# Patient Record
Sex: Male | Born: 1965 | Race: White | Hispanic: No | Marital: Married | Smoking: Current every day smoker
Health system: Southern US, Community
[De-identification: ages and names within clinical notes are randomized; demographics above are authoritative.]

## PROBLEM LIST (undated history)

## (undated) DIAGNOSIS — S0990XA Unspecified injury of head, initial encounter: Secondary | ICD-10-CM

## (undated) DIAGNOSIS — J189 Pneumonia, unspecified organism: Secondary | ICD-10-CM

## (undated) DIAGNOSIS — I10 Essential (primary) hypertension: Secondary | ICD-10-CM

## (undated) DIAGNOSIS — R233 Spontaneous ecchymoses: Secondary | ICD-10-CM

## (undated) DIAGNOSIS — R0602 Shortness of breath: Secondary | ICD-10-CM

## (undated) DIAGNOSIS — I2510 Atherosclerotic heart disease of native coronary artery without angina pectoris: Secondary | ICD-10-CM

## (undated) DIAGNOSIS — E785 Hyperlipidemia, unspecified: Secondary | ICD-10-CM

## (undated) DIAGNOSIS — G4730 Sleep apnea, unspecified: Secondary | ICD-10-CM

## (undated) DIAGNOSIS — R3915 Urgency of urination: Secondary | ICD-10-CM

## (undated) DIAGNOSIS — F329 Major depressive disorder, single episode, unspecified: Secondary | ICD-10-CM

## (undated) DIAGNOSIS — I219 Acute myocardial infarction, unspecified: Secondary | ICD-10-CM

## (undated) DIAGNOSIS — M2540 Effusion, unspecified joint: Secondary | ICD-10-CM

## (undated) DIAGNOSIS — F419 Anxiety disorder, unspecified: Secondary | ICD-10-CM

## (undated) DIAGNOSIS — R238 Other skin changes: Secondary | ICD-10-CM

## (undated) DIAGNOSIS — M1990 Unspecified osteoarthritis, unspecified site: Secondary | ICD-10-CM

## (undated) DIAGNOSIS — R609 Edema, unspecified: Secondary | ICD-10-CM

## (undated) DIAGNOSIS — R600 Localized edema: Secondary | ICD-10-CM

## (undated) DIAGNOSIS — J45909 Unspecified asthma, uncomplicated: Secondary | ICD-10-CM

## (undated) DIAGNOSIS — R351 Nocturia: Secondary | ICD-10-CM

## (undated) DIAGNOSIS — F909 Attention-deficit hyperactivity disorder, unspecified type: Secondary | ICD-10-CM

## (undated) DIAGNOSIS — I509 Heart failure, unspecified: Secondary | ICD-10-CM

## (undated) DIAGNOSIS — R51 Headache: Secondary | ICD-10-CM

## (undated) DIAGNOSIS — M2550 Pain in unspecified joint: Secondary | ICD-10-CM

## (undated) DIAGNOSIS — IMO0002 Reserved for concepts with insufficient information to code with codable children: Secondary | ICD-10-CM

## (undated) DIAGNOSIS — F32A Depression, unspecified: Secondary | ICD-10-CM

## (undated) DIAGNOSIS — K219 Gastro-esophageal reflux disease without esophagitis: Secondary | ICD-10-CM

## (undated) DIAGNOSIS — Z955 Presence of coronary angioplasty implant and graft: Secondary | ICD-10-CM

## (undated) HISTORY — DX: Essential (primary) hypertension: I10

## (undated) HISTORY — PX: COLONOSCOPY: SHX174

## (undated) HISTORY — DX: Hyperlipidemia, unspecified: E78.5

## (undated) HISTORY — PX: CORONARY ANGIOPLASTY: SHX604

## (undated) HISTORY — PX: KNEE SURGERY: SHX244

## (undated) HISTORY — DX: Acute myocardial infarction, unspecified: I21.9

---
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
## 1998-06-01 ENCOUNTER — Encounter: Payer: Self-pay | Admitting: *Deleted

## 1998-06-01 ENCOUNTER — Inpatient Hospital Stay (HOSPITAL_COMMUNITY): Admission: EM | Admit: 1998-06-01 | Discharge: 1998-06-05 | Payer: Self-pay | Admitting: *Deleted

## 1998-06-11 ENCOUNTER — Encounter: Admission: RE | Admit: 1998-06-11 | Discharge: 1998-09-09 | Payer: Self-pay | Admitting: *Deleted

## 1998-06-14 DIAGNOSIS — I219 Acute myocardial infarction, unspecified: Secondary | ICD-10-CM

## 1998-06-14 HISTORY — DX: Acute myocardial infarction, unspecified: I21.9

## 1998-06-27 ENCOUNTER — Ambulatory Visit (HOSPITAL_COMMUNITY): Admission: RE | Admit: 1998-06-27 | Discharge: 1998-06-27 | Payer: Self-pay | Admitting: Cardiology

## 1998-06-27 ENCOUNTER — Encounter: Payer: Self-pay | Admitting: Cardiology

## 2000-03-18 ENCOUNTER — Encounter: Payer: Self-pay | Admitting: Emergency Medicine

## 2000-03-18 ENCOUNTER — Inpatient Hospital Stay (HOSPITAL_COMMUNITY): Admission: EM | Admit: 2000-03-18 | Discharge: 2000-03-22 | Payer: Self-pay | Admitting: Emergency Medicine

## 2000-03-20 ENCOUNTER — Encounter: Payer: Self-pay | Admitting: Cardiology

## 2000-03-22 ENCOUNTER — Encounter: Payer: Self-pay | Admitting: Cardiology

## 2000-03-29 ENCOUNTER — Encounter: Payer: Self-pay | Admitting: Cardiology

## 2000-03-29 ENCOUNTER — Ambulatory Visit (HOSPITAL_COMMUNITY): Admission: RE | Admit: 2000-03-29 | Discharge: 2000-03-29 | Payer: Self-pay | Admitting: Cardiology

## 2000-04-14 ENCOUNTER — Ambulatory Visit (HOSPITAL_COMMUNITY): Admission: RE | Admit: 2000-04-14 | Discharge: 2000-04-14 | Payer: Self-pay | Admitting: Cardiology

## 2000-04-14 ENCOUNTER — Encounter: Payer: Self-pay | Admitting: Cardiology

## 2002-04-16 ENCOUNTER — Emergency Department (HOSPITAL_COMMUNITY): Admission: EM | Admit: 2002-04-16 | Discharge: 2002-04-16 | Payer: Self-pay | Admitting: Emergency Medicine

## 2002-04-16 ENCOUNTER — Encounter: Payer: Self-pay | Admitting: Emergency Medicine

## 2008-12-02 ENCOUNTER — Inpatient Hospital Stay (HOSPITAL_COMMUNITY): Admission: EM | Admit: 2008-12-02 | Discharge: 2008-12-06 | Payer: Self-pay | Admitting: Emergency Medicine

## 2008-12-05 HISTORY — PX: CARDIAC CATHETERIZATION: SHX172

## 2009-06-03 ENCOUNTER — Emergency Department (HOSPITAL_COMMUNITY): Admission: EM | Admit: 2009-06-03 | Discharge: 2009-06-03 | Payer: Self-pay | Admitting: Emergency Medicine

## 2009-06-04 ENCOUNTER — Ambulatory Visit (HOSPITAL_COMMUNITY): Admission: RE | Admit: 2009-06-04 | Discharge: 2009-06-04 | Payer: Self-pay | Admitting: Cardiology

## 2009-06-04 ENCOUNTER — Encounter (INDEPENDENT_AMBULATORY_CARE_PROVIDER_SITE_OTHER): Payer: Self-pay | Admitting: Cardiology

## 2009-06-04 ENCOUNTER — Ambulatory Visit: Payer: Self-pay | Admitting: Vascular Surgery

## 2009-07-03 ENCOUNTER — Emergency Department (HOSPITAL_COMMUNITY): Admission: EM | Admit: 2009-07-03 | Discharge: 2009-07-03 | Payer: Self-pay | Admitting: Emergency Medicine

## 2010-03-18 ENCOUNTER — Emergency Department (HOSPITAL_COMMUNITY): Admission: EM | Admit: 2010-03-18 | Discharge: 2010-03-18 | Payer: Self-pay | Admitting: Emergency Medicine

## 2010-08-26 LAB — BASIC METABOLIC PANEL
BUN: 10 mg/dL (ref 6–23)
CO2: 29 mEq/L (ref 19–32)
Calcium: 8.8 mg/dL (ref 8.4–10.5)
Chloride: 100 mEq/L (ref 96–112)
Creatinine, Ser: 0.91 mg/dL (ref 0.4–1.5)
GFR calc Af Amer: >60 mL/min (ref >60)
GFR calc non Af Amer: >60 mL/min (ref >60)
Glucose, Bld: 222 mg/dL — ABNORMAL HIGH (ref 70–99)
Potassium: 4.5 mEq/L (ref 3.5–5.1)
Sodium: 137 mEq/L (ref 135–145)

## 2010-08-26 LAB — CBC
HCT: 47.5 % (ref 39.0–52.0)
Hemoglobin: 16.9 g/dL (ref 13.0–17.0)
MCH: 33.7 pg (ref 26.0–34.0)
MCHC: 35.6 g/dL (ref 30.0–36.0)
MCV: 94.6 fL (ref 78.0–100.0)
Platelets: 251 10*3/uL (ref 150–400)
RBC: 5.02 MIL/uL (ref 4.22–5.81)
RDW: 13.7 % (ref 11.5–15.5)
WBC: 10.1 10*3/uL (ref 4.0–10.5)

## 2010-08-26 LAB — DIFFERENTIAL
Basophils Absolute: 0 10*3/uL (ref 0.0–0.1)
Basophils Relative: 0 % (ref 0–1)
Eosinophils Absolute: 0.2 10*3/uL (ref 0.0–0.7)
Eosinophils Relative: 2 % (ref 0–5)
Lymphocytes Relative: 26 % (ref 12–46)
Lymphs Abs: 2.6 10*3/uL (ref 0.7–4.0)
Monocytes Absolute: 0.8 10*3/uL (ref 0.1–1.0)
Monocytes Relative: 8 % (ref 3–12)
Neutro Abs: 6.5 10*3/uL (ref 1.7–7.7)
Neutrophils Relative %: 64 % (ref 43–77)

## 2010-08-26 LAB — POCT CARDIAC MARKERS
CKMB, poc: <1 ng/mL — ABNORMAL LOW (ref 1.0–8.0)
CKMB, poc: <1 ng/mL — ABNORMAL LOW (ref 1.0–8.0)
Myoglobin, poc: 27.4 ng/mL (ref 12–200)
Myoglobin, poc: 28.9 ng/mL (ref 12–200)
Troponin i, poc: <0.05 ng/mL (ref 0.00–0.09)
Troponin i, poc: <0.05 ng/mL (ref 0.00–0.09)

## 2010-08-26 LAB — POCT I-STAT, CHEM 8
BUN: 10 mg/dL (ref 6–23)
Calcium, Ion: 1.03 mmol/L — ABNORMAL LOW (ref 1.12–1.32)
Chloride: 101 mEq/L (ref 96–112)
Creatinine, Ser: 0.9 mg/dL (ref 0.4–1.5)
Glucose, Bld: 258 mg/dL — ABNORMAL HIGH (ref 70–99)
HCT: 51 % (ref 39.0–52.0)
Hemoglobin: 17.3 g/dL — ABNORMAL HIGH (ref 13.0–17.0)
Potassium: 5.4 mEq/L — ABNORMAL HIGH (ref 3.5–5.1)
Sodium: 135 mEq/L (ref 135–145)
TCO2: 29 mmol/L (ref 0–100)

## 2010-08-31 LAB — POCT I-STAT, CHEM 8
BUN: 16 mg/dL (ref 6–23)
Calcium, Ion: 1.11 mmol/L — ABNORMAL LOW (ref 1.12–1.32)
Chloride: 96 mEq/L (ref 96–112)
Creatinine, Ser: 0.5 mg/dL (ref 0.4–1.5)
Glucose, Bld: 524 mg/dL — ABNORMAL HIGH (ref 70–99)
HCT: 52 % (ref 39.0–52.0)
Hemoglobin: 17.7 g/dL — ABNORMAL HIGH (ref 13.0–17.0)
Potassium: 4.3 mEq/L (ref 3.5–5.1)
TCO2: 28 mmol/L (ref 0–100)

## 2010-08-31 LAB — GLUCOSE, CAPILLARY: Glucose-Capillary: 381 mg/dL — ABNORMAL HIGH (ref 70–99)

## 2010-09-14 LAB — DIFFERENTIAL
Basophils Absolute: 0.5 10*3/uL — ABNORMAL HIGH (ref 0.0–0.1)
Basophils Relative: 5 % — ABNORMAL HIGH (ref 0–1)
Eosinophils Absolute: 0.1 10*3/uL (ref 0.0–0.7)
Eosinophils Relative: 1 % (ref 0–5)
Lymphocytes Relative: 11 % — ABNORMAL LOW (ref 12–46)
Lymphs Abs: 1 10*3/uL (ref 0.7–4.0)
Monocytes Absolute: 0.5 10*3/uL (ref 0.1–1.0)
Monocytes Relative: 6 % (ref 3–12)
Neutro Abs: 6.9 10*3/uL (ref 1.7–7.7)
Neutrophils Relative %: 77 % (ref 43–77)

## 2010-09-14 LAB — POCT I-STAT, CHEM 8
BUN: 15 mg/dL (ref 6–23)
Calcium, Ion: 1.06 mmol/L — ABNORMAL LOW (ref 1.12–1.32)
Chloride: 97 mEq/L (ref 96–112)
Creatinine, Ser: 0.7 mg/dL (ref 0.4–1.5)
Glucose, Bld: 300 mg/dL — ABNORMAL HIGH (ref 70–99)
HCT: 45 % (ref 39.0–52.0)
Hemoglobin: 15.3 g/dL (ref 13.0–17.0)
Potassium: 3.8 mEq/L (ref 3.5–5.1)
Sodium: 131 mEq/L — ABNORMAL LOW (ref 135–145)
TCO2: 26 mmol/L (ref 0–100)

## 2010-09-14 LAB — CBC
HCT: 43.3 % (ref 39.0–52.0)
Hemoglobin: 14.4 g/dL (ref 13.0–17.0)
MCHC: 33.4 g/dL (ref 30.0–36.0)
MCV: 97.9 fL (ref 78.0–100.0)
Platelets: 244 10*3/uL (ref 150–400)
RBC: 4.42 MIL/uL (ref 4.22–5.81)
RDW: 14 % (ref 11.5–15.5)
WBC: 9 10*3/uL (ref 4.0–10.5)

## 2010-09-16 ENCOUNTER — Emergency Department (HOSPITAL_BASED_OUTPATIENT_CLINIC_OR_DEPARTMENT_OTHER)
Admission: EM | Admit: 2010-09-16 | Discharge: 2010-09-16 | Disposition: A | Discharge status: Home / Self Care | Payer: PRIVATE HEALTH INSURANCE | Attending: Emergency Medicine | Admitting: Emergency Medicine

## 2010-09-16 ENCOUNTER — Emergency Department (INDEPENDENT_AMBULATORY_CARE_PROVIDER_SITE_OTHER): Payer: PRIVATE HEALTH INSURANCE

## 2010-09-16 DIAGNOSIS — F17200 Nicotine dependence, unspecified, uncomplicated: Secondary | ICD-10-CM | POA: Insufficient documentation

## 2010-09-16 DIAGNOSIS — R5383 Other fatigue: Secondary | ICD-10-CM | POA: Insufficient documentation

## 2010-09-16 DIAGNOSIS — R109 Unspecified abdominal pain: Secondary | ICD-10-CM

## 2010-09-16 DIAGNOSIS — R5381 Other malaise: Secondary | ICD-10-CM | POA: Insufficient documentation

## 2010-09-16 DIAGNOSIS — E785 Hyperlipidemia, unspecified: Secondary | ICD-10-CM | POA: Insufficient documentation

## 2010-09-16 DIAGNOSIS — E1169 Type 2 diabetes mellitus with other specified complication: Secondary | ICD-10-CM | POA: Insufficient documentation

## 2010-09-16 DIAGNOSIS — Z79899 Other long term (current) drug therapy: Secondary | ICD-10-CM | POA: Insufficient documentation

## 2010-09-16 DIAGNOSIS — I252 Old myocardial infarction: Secondary | ICD-10-CM | POA: Insufficient documentation

## 2010-09-16 DIAGNOSIS — N200 Calculus of kidney: Secondary | ICD-10-CM | POA: Insufficient documentation

## 2010-09-16 LAB — DIFFERENTIAL
Basophils Absolute: 0 10*3/uL (ref 0.0–0.1)
Basophils Relative: 0 % (ref 0–1)
Lymphocytes Relative: 23 % (ref 12–46)
Lymphs Abs: 2.5 10*3/uL (ref 0.7–4.0)
Monocytes Absolute: 0.9 10*3/uL (ref 0.1–1.0)
Monocytes Relative: 9 % (ref 3–12)
Neutro Abs: 7.2 10*3/uL (ref 1.7–7.7)
Neutrophils Relative %: 67 % (ref 43–77)

## 2010-09-16 LAB — COMPREHENSIVE METABOLIC PANEL
ALT: 18 U/L (ref 0–53)
AST: 26 U/L (ref 0–37)
Albumin: 4.3 g/dL (ref 3.5–5.2)
Alkaline Phosphatase: 68 U/L (ref 39–117)
CO2: 30 mEq/L (ref 19–32)
Calcium: 8.9 mg/dL (ref 8.4–10.5)
Chloride: 98 mEq/L (ref 96–112)
Creatinine, Ser: 0.8 mg/dL (ref 0.4–1.5)
GFR calc Af Amer: >60 mL/min (ref >60)
GFR calc non Af Amer: >60 mL/min (ref >60)
Sodium: 141 mEq/L (ref 135–145)
Total Bilirubin: 0.8 mg/dL (ref 0.3–1.2)
Total Protein: 7.4 g/dL (ref 6.0–8.3)

## 2010-09-16 LAB — URINALYSIS, ROUTINE W REFLEX MICROSCOPIC
Glucose, UA: 250 mg/dL — AB
Hgb urine dipstick: NEGATIVE
Ketones, ur: NEGATIVE mg/dL
Nitrite: NEGATIVE
Protein, ur: NEGATIVE mg/dL
Specific Gravity, Urine: 1.009 (ref 1.005–1.030)
Urobilinogen, UA: 0.2 mg/dL (ref 0.0–1.0)
pH: 6 (ref 5.0–8.0)

## 2010-09-16 LAB — CBC
HCT: 47.3 % (ref 39.0–52.0)
Hemoglobin: 16.2 g/dL (ref 13.0–17.0)
MCH: 32.6 pg (ref 26.0–34.0)
Platelets: 228 10*3/uL (ref 150–400)
RBC: 4.97 MIL/uL (ref 4.22–5.81)
RDW: 14 % (ref 11.5–15.5)
WBC: 10.7 10*3/uL — ABNORMAL HIGH (ref 4.0–10.5)

## 2010-09-16 MED ORDER — IOHEXOL 300 MG/ML  SOLN
100.0000 mL | Freq: Once | INTRAMUSCULAR | Status: AC | PRN
  Administered 2010-09-16: 100 mL via INTRAVENOUS

## 2010-09-21 LAB — GLUCOSE, CAPILLARY
Glucose-Capillary: 148 mg/dL — ABNORMAL HIGH (ref 70–99)
Glucose-Capillary: 152 mg/dL — ABNORMAL HIGH (ref 70–99)
Glucose-Capillary: 178 mg/dL — ABNORMAL HIGH (ref 70–99)
Glucose-Capillary: 221 mg/dL — ABNORMAL HIGH (ref 70–99)
Glucose-Capillary: 221 mg/dL — ABNORMAL HIGH (ref 70–99)
Glucose-Capillary: 236 mg/dL — ABNORMAL HIGH (ref 70–99)
Glucose-Capillary: 290 mg/dL — ABNORMAL HIGH (ref 70–99)

## 2010-09-21 LAB — BASIC METABOLIC PANEL
BUN: 12 mg/dL (ref 6–23)
BUN: 8 mg/dL (ref 6–23)
Calcium: 8.8 mg/dL (ref 8.4–10.5)
Chloride: 104 mEq/L (ref 96–112)
Creatinine, Ser: 0.68 mg/dL (ref 0.4–1.5)
Creatinine, Ser: 0.83 mg/dL (ref 0.4–1.5)
GFR calc Af Amer: >60 mL/min (ref >60)
GFR calc non Af Amer: >60 mL/min (ref >60)
GFR calc non Af Amer: >60 mL/min (ref >60)
Glucose, Bld: 196 mg/dL — ABNORMAL HIGH (ref 70–99)
Sodium: 135 mEq/L (ref 135–145)

## 2010-09-21 LAB — POCT I-STAT, CHEM 8
Calcium, Ion: 1.08 mmol/L — ABNORMAL LOW (ref 1.12–1.32)
Chloride: 96 mEq/L (ref 96–112)
HCT: 57 % — ABNORMAL HIGH (ref 39.0–52.0)
Sodium: 134 mEq/L — ABNORMAL LOW (ref 135–145)
TCO2: 30 mmol/L (ref 0–100)

## 2010-09-21 LAB — CBC
HCT: 48.9 % (ref 39.0–52.0)
HCT: 54.5 % — ABNORMAL HIGH (ref 39.0–52.0)
Hemoglobin: 16.6 g/dL (ref 13.0–17.0)
Hemoglobin: 18 g/dL — ABNORMAL HIGH (ref 13.0–17.0)
Hemoglobin: 18.5 g/dL — ABNORMAL HIGH (ref 13.0–17.0)
MCHC: 33.9 g/dL (ref 30.0–36.0)
MCHC: 34 g/dL (ref 30.0–36.0)
MCV: 99.2 fL (ref 78.0–100.0)
MCV: 99.5 fL (ref 78.0–100.0)
Platelets: 164 10*3/uL (ref 150–400)
Platelets: 169 10*3/uL (ref 150–400)
RBC: 4.89 MIL/uL (ref 4.22–5.81)
RBC: 4.95 MIL/uL (ref 4.22–5.81)
RDW: 14.2 % (ref 11.5–15.5)
RDW: 14.4 % (ref 11.5–15.5)
RDW: 14.5 % (ref 11.5–15.5)
WBC: 7.1 10*3/uL (ref 4.0–10.5)

## 2010-09-21 LAB — PROTIME-INR: Prothrombin Time: 11.8 seconds (ref 11.6–15.2)

## 2010-09-21 LAB — CK TOTAL AND CKMB (NOT AT ARMC)
CK, MB: 0.8 ng/mL (ref 0.3–4.0)
CK, MB: 0.9 ng/mL (ref 0.3–4.0)
CK, MB: 1 ng/mL (ref 0.3–4.0)
Relative Index: INVALID (ref 0.0–2.5)
Total CK: 43 U/L (ref 7–232)

## 2010-09-21 LAB — COMPREHENSIVE METABOLIC PANEL
Albumin: 4 g/dL (ref 3.5–5.2)
Alkaline Phosphatase: 102 U/L (ref 39–117)
BUN: 11 mg/dL (ref 6–23)
CO2: 28 mEq/L (ref 19–32)
Chloride: 98 mEq/L (ref 96–112)
Glucose, Bld: 204 mg/dL — ABNORMAL HIGH (ref 70–99)
Potassium: 5.1 mEq/L (ref 3.5–5.1)
Total Bilirubin: 1.6 mg/dL — ABNORMAL HIGH (ref 0.3–1.2)

## 2010-09-21 LAB — DIFFERENTIAL
Basophils Absolute: 0 10*3/uL (ref 0.0–0.1)
Basophils Relative: 0 % (ref 0–1)
Monocytes Absolute: 0.7 10*3/uL (ref 0.1–1.0)
Neutro Abs: 3.7 10*3/uL (ref 1.7–7.7)
Neutrophils Relative %: 56 % (ref 43–77)

## 2010-09-21 LAB — LIPID PANEL: VLDL: 39 mg/dL (ref 0–40)

## 2010-09-21 LAB — APTT: aPTT: 24 seconds (ref 24–37)

## 2010-09-21 LAB — TROPONIN I: Troponin I: 0.01 ng/mL (ref 0.00–0.06)

## 2010-10-22 ENCOUNTER — Emergency Department (HOSPITAL_BASED_OUTPATIENT_CLINIC_OR_DEPARTMENT_OTHER)
Admission: EM | Admit: 2010-10-22 | Discharge: 2010-10-23 | Disposition: A | Discharge status: Home / Self Care | Payer: PRIVATE HEALTH INSURANCE | Attending: Emergency Medicine | Admitting: Emergency Medicine

## 2010-10-22 DIAGNOSIS — E785 Hyperlipidemia, unspecified: Secondary | ICD-10-CM | POA: Insufficient documentation

## 2010-10-22 DIAGNOSIS — Z09 Encounter for follow-up examination after completed treatment for conditions other than malignant neoplasm: Secondary | ICD-10-CM | POA: Insufficient documentation

## 2010-10-22 DIAGNOSIS — E119 Type 2 diabetes mellitus without complications: Secondary | ICD-10-CM | POA: Insufficient documentation

## 2010-10-22 DIAGNOSIS — F17200 Nicotine dependence, unspecified, uncomplicated: Secondary | ICD-10-CM | POA: Insufficient documentation

## 2010-10-22 DIAGNOSIS — Z79899 Other long term (current) drug therapy: Secondary | ICD-10-CM | POA: Insufficient documentation

## 2010-10-22 DIAGNOSIS — I252 Old myocardial infarction: Secondary | ICD-10-CM | POA: Insufficient documentation

## 2010-10-27 NOTE — Cardiovascular Report (Signed)
NAMEHOMERO, HYSON                   ACCOUNT NO.:  [id number]   MEDICAL RECORD NO.:  [id number]           PATIENT TYPE:   LOCATION:                                 FACILITY:   PHYSICIAN:  Mohan N. Sharyn Lull, M.D. DATE OF BIRTH:  1965-09-25   DATE OF PROCEDURE:  12/05/2008  DATE OF DISCHARGE:                            CARDIAC CATHETERIZATION   PROCEDURES:  1. Left cardiac catheterization with selective left and right coronary      angiography, left ventriculography via right groin using Judkins      technique.  2. Attempted percutaneous transluminal coronary angioplasty to      chronically occluded left anterior descending without success.  3. Successful percutaneous transluminal coronary angioplasty to ostial      diagonal 1 and proximal left anterior descending using 3.0- x 8-mm      long Voyager balloon.  4. Successful deployment of 3.5- x 18-mm long XIENCE V stent in ostial      diagonal and proximal left anterior descending.  5. Successful post dilatation of this stent using 3.75- x 15-mm long      [place] Voyager balloon.   INDICATION FOR THE PROCEDURE:  Mr. Kadarious Dikes is 45 year old white male  with past medical history significant for coronary artery disease;  history of anteroseptal wall MI, approximately 10+ years ago;  hypertension; non-insulin-dependent diabetes mellitus;  hypercholesteremia; morbid obesity; tobacco abuse.  He came to the ER  complaining of vague chest pain described as tightness associated with  nausea, denies shortness of breath.  He states on Friday night while  eating dinner, he had violent coughing episode followed by syncopal  episode lasting a few seconds per wife.  Denies any chest pain at that  time.  Denies palpitation, weakness in arms or legs prior to syncope.  Denies such episodes in the past.  Did not seek medical attention then  but today as he had chest pain and feeling weak, so decided to come to  the ER.  The patient was admitted to telemetry  unit.  MI was ruled out  by serial enzymes and EKG.  The patient subsequently underwent  Persantine Myoview, which showed mild area of ischemia in the  anteroseptal wall with EF of 46%.  Due to multiple risk factors and  chest pain and mildly abnormal stress test, I discussed with the patient  and his wife at length regarding left cath, possible PTCA and stenting,  its risks and benefits, i.e., death, MI, stroke, need for emergency  CABG, risk of restenosis, local vascular complications, etc., and  consented for the procedure.   PROCEDURE:  After obtaining the informed consent, the patient was  brought to the cath lab and was placed on fluoroscopy table.  Right  groin was prepped and draped in usual fashion.  A 1% Xylocaine was used  for local anesthesia in the right groin.  With the help of thin wall  needle, a 6-French arterial sheath was placed.  The sheath was aspirated  and flushed.  Next, a 6-French left Judkins catheter was advanced over  the  wire under fluoroscopic guidance up to the ascending aorta.  Wire  was pulled out, the catheter was aspirated and connected to the  manifold.  Catheter was further advanced and engaged into left coronary  ostium.  Multiple views of the left system were taken.  Next, the  catheter was disengaged and was pulled out over the wire and was  replaced with 6-French right Judkins catheter, which was advanced over  the wire under fluoroscopic guidance up to the ascending aorta.  Wire  was pulled out, the catheter was aspirated, and connected to the  manifold.  Catheter was further advanced and engaged into right coronary  ostium.  Multiple views of the right system were taken.  Next, the  catheter was disengaged and was pulled out over the wire and was  replaced with 6-French pigtail catheter, which was advanced over the  wire under fluoroscopic guidance up to the ascending aorta.  Wire was  pulled out, the catheter was aspirated, and connected to the  manifold.  Catheter was further advanced across the aortic valve into the LV.  LV  pressures were recorded.  Next, LV graphy was done in 30-degree RAO  position.  Post angiographic pressures were recorded from LV and then  pullback pressures were recorded from the aorta.  There was no  significant gradient across the aortic valve.  Next, the pigtail  catheter was pulled out over the wire.  Sheaths were aspirated and  flushed.   FINDINGS:  LV showed anterolateral wall hypokinesia with EF of 45-50%  approximately, left main was patent, LAD was patent proximally and then  was 100% occluded with diffuse in-stent restenosis, filling faintly by  collaterals from left and right system.  Diagonal 1 has 30% ostial  stenosis with haziness with TIMI grade 3 distal flow.  Ramus was very  very small.  Left circumflex was patent.  OM-1 was very very small.  OM-  2 was moderate size, which was patent.  OM-3 was very small.  RCA had 10-  15% proximal and 20-25% distal stenosis.  PDA and PLV branches were very  small.  PDA was small, which was patent.  PLV branches were very small.   INTERVENTIONAL PROCEDURE:  Attempted to cross the chronic total  occlusion of proximal LAD using multiple wires, i.e., BMW, Miracle  Brothers 3, 6, and 12 gram with balloon support and Fielder XT wire over  the Finecross catheter without success.  The patient received weight-  based Angiomax and 60 mg of prasugrel prior to the procedure.  Initial  ACT prior to the procedure was 341.  Repeat ACT during the procedure was  243 requiring half bolus of Angiomax over 2 minutes.  Repeat ACT was 289  requiring another half bolus of Angiomax, which raised the ACT to 393.  Angiogram showed haziness at the ostium of the diagonal 1, suggestive of  thrombus and distal embolization and distal poor flow.  The patient  received antegrade Integrilin bolus and then had PTCA to ostial diagonal  1 done using 3.0- x 8-mm long Voyager balloon.   Angiogram showed  persistent haziness, but improvement in distal flow and then 3.5- x 18-  mm long XIENCE V stent was deployed in the ostial diagonal 1 and  proximal LAD at 10 atmospheric pressure.  Stent was post dilated using  3.75- x 15-mm long [place] Voyager balloon going up to 18 atmospheric  pressure with 0% residual stenosis with excellent TIMI grade 3 distal  flow without  evidence of dissection and normalization of distal blood  flow.  The patient received weight-based Angiomax and 2 half boluses of  Angiomax and Integrilin and 60 mg of Effient as above during the  procedure.  The patient tolerated the procedure well.  There are no  complications.  The patient was transferred to recovery room in stable  condition.      Eduardo Osier. Sharyn Lull, M.D.  Electronically Signed     Eduardo Osier. Sharyn Lull, M.D.  Electronically Signed    MNH/MEDQ  D:  12/05/2008  T:  12/06/2008  Job:  [id number]   cc:   Cath Lab

## 2010-10-27 NOTE — Discharge Summary (Signed)
Cody [name], Cody [name]                   ACCOUNT NO.:  [id number]   MEDICAL RECORD NO.:  [id number]          PATIENT TYPE:  INP   LOCATION:  2501                         FACILITY:  [facility]   PHYSICIAN:  Cody [name], M.D. DATE OF BIRTH:  December 26, 1965   DATE OF ADMISSION:  12/02/2008  DATE OF DISCHARGE:  12/06/2008                               DISCHARGE SUMMARY   ADMITTING DIAGNOSES:  1. Chest pain, rule out myocardial infarction.  2. Coronary artery disease.  3. History of anteroseptal wall myocardial infarction in the past.  4. Status post syncope.  5. Rule out cardiac arrhythmias.  6. Hypertension.  7. Uncontrolled diabetes mellitus.  8. Morbid obesity.  9. Hypercholesteremia.  10.Resolving right buttock abscess.   DISCHARGE DIAGNOSES:  1. Stable angina.  2. Positive Persantine Myoview.  3. Status post attempted percutaneous coronary intervention to 100%      occluded/percutaneous transluminal coronary angioplasty and      stenting to ostial diagonal 1 and proximal left anterior      descending.  4. Coronary artery disease.  5. History of anteroseptal wall myocardial infarction.  6. Hypertension.  7. Non-insulin-dependent diabetes mellitus.  8. Morbid obesity.  9. Status post syncope.  10.Resolving right buttock abscess.  11.Tobacco abuse.   DISCHARGE HOME MEDICATIONS:  1. Enteric-coated aspirin 325 mg 1 tablet daily.  2. Effient 10 mg 1 tablet daily.  3. Atenolol 25 mg 1 tablet daily.  4. Ramipril 10 mg 1 capsule twice daily.  5. Lipitor 20 mg 1 tablet daily.  6. Furosemide 40 mg 1 tablet twice daily.  7. Xanax 0.25 mg 1 tablet twice daily.  8. Metformin 1000 mg twice daily with food starting from tomorrow.  9. Glipizide ER 10 mg twice daily.  10.NicoDerm CQ 14 mg for 24 hours patch daily.  11.Doxycycline 100 mg 1 tablet twice daily for 2 weeks.  12.Nitrostat 0.4 mg sublingual use as directed.   DIET:  Low salt, low cholesterol, 1800 calories ADA diet.   The  patient has been advised to reduce weight.  The patient has been  advised to stop smoking, which he agrees.  The patient has been advised  to monitor blood sugar and blood pressure daily.  Post-cardiac cath and  PTCA instructions have been given.  The patient is scheduled for phase  II cardiac rehab as outpatient.   CONDITION AT DISCHARGE:  Stable.   BRIEF HISTORY AND HOSPITAL COURSE:  Cody [name] is a 45 year old white male  with past medical history significant for coronary artery disease,  history of anteroseptal wall MI in 1990s approximately 10 years ago,  hypertension, non-insulin-dependent diabetes mellitus,  hypercholesteremia, morbid obesity, and tobacco abuse.  He came to the  ER complaining of vague chest pain, described as tightness, associated  with nausea.  Denies any shortness of breath.  He states on Friday night  while eating dinner had bowel and burping episode and then subsequently  had syncopal episode lasting few seconds per wife.  Denies any  palpitation prior to syncope.  Denies any weakness in arms or legs prior  to syncope.  Denies such episodes in the past.  Did not seek any medical  attention on Friday, but today as he had chest pain and felt weak, so  decided to come to the ER.  Denies chest pain, shortness of breath, or  palpitation at present when seen in the ER.   PAST MEDICAL HISTORY:  As above.   PAST SURGICAL HISTORY:  He had right knee surgery in the past.   SOCIAL HISTORY:  He is married, lives in [place].  He smoked 2 pack  per day for 10 years and now smokes half pack per day.  No history of  alcohol abuse.   FAMILY HISTORY:  Father died of CA of lung.  Mother died of motor  vehicle accident.  One sister in good health.   ALLERGIES:  No known drug allergies.   HOME MEDICATION:  He is on:  1. Enteric-coated aspirin 1 p.o. daily.  2. Atenolol 25 mg p.o. daily.  3. Altace 10 mg p.o. b.i.d.  4. Lasix 20 mg p.o. daily.  5. Lipitor 20 mg p.o.  daily.  6. Xanax 0.25 mg p.o. b.i.d.  7. Glucotrol XL 10 mg p.o. b.i.d.  8. Metformin 500 p.o. b.i.d.   PHYSICAL EXAMINATION:  GENERAL:  He is alert, awake, and oriented x3 in  no acute distress.  VITAL SIGNS:  Blood pressure was 109/71, pulse was 65 regular, afebrile.  NECK:  Conjunctivae were pink.  NECK:  Supple.  No JVD.  No bruit.  LUNGS:  Clear to auscultation without rhonchi or rales.  CARDIOVASCULAR:  S1 and S2 was normal.  There was soft systolic murmur.  There was no S3 gallop.  ABDOMEN:  Soft, obese, and nontender.  BUTTOCKS:  He has edema and healing abscess.  There was no further  drainage.  EXTREMITIES:  There is no clubbing, cyanosis, or edema.   LABORATORY DATA:  CBC:  Hemoglobin was 18.5, hematocrit 54.5, and white  count of 6.6.  His 3 sets of cardiac enzymes were negative.  His sodium  was 134, potassium 5.1, glucose 204, BUN 11, and creatinine 0.84.  Hemoglobin A1c was 10.1.  His cholesterol was 155, triglyceride 197, HDL  was low 31, and LDL was 85.  His Persantine Myoview showed a small area  of ischemia in the anteroseptal and apex.  Chest x-ray showed no acute  disease.   BRIEF HOSPITAL COURSE:  The patient was admitted to telemetry unit.  MI  was ruled out by serial enzymes and EKG.  The patient subsequently  underwent Persantine Myoview which showed a small area of ischemia in  the anteroseptal and apical region as above.  The patient did not have  any episodes of arrhythmias or syncopal episode during the hospital  stay.  I discussed with the patient and his wife at length regarding  left cath, possible PTCA and stenting, risks and benefits and consented  for the PCI.  The patient subsequently underwent attempted PCI and also  PTCA stenting to the ostial diagonal and proximal LAD as per procedure  report yesterday.  LAD lesion appears to be chronically occluded and  could not be opened despite stenting with multiple wires as per  procedure report.  The  patient did not have any chest pain during the  hospital stay.  Post-procedure, his cardiac enzymes have been negative.  Phase I cardiac rehab was called.  The patient has been ambulating in  the hallway without any problem.  His groin is stable  with no evidence  of hematoma or bruit.  The patient will be discharged home on above  medications and will be followed up in my office in 1 week.  We will  discuss with the patient if he continues to have recurrent chest pain,  various options of treatment, i.e., CABG with bypass to LIMA to LAD  versus re-cath and if there is any channel re-attempt to open up left  LAD or if he is asymptomatic as he has collaterals to LAD treated  medically.      Eduardo Osier. Sharyn [name], M.D.  Electronically Signed     Eduardo Osier. Sharyn [name], M.D.  Electronically Signed    MNH/MEDQ  D:  12/06/2008  T:  12/06/2008  Job:  [id number]

## 2010-10-30 NOTE — Discharge Summary (Signed)
[place]  Patient:    Cody [name], Cody [name]                          MRN: [id number] Adm. Date:  [id number] Disc. Date: [id number] Attending:  Rinaldo Cloud N                           Discharge Summary  ADMISSION DIAGNOSES: 1. Right middle lobe pneumonia. 2. Coronary artery disease. 3. Status post anteroseptal wall myocardial infarction. 4. Hypertension. 5. Non-insulin-dependent diabetes mellitus. 6. Hypercholesterolemia. 7. Morbid obesity.  FINAL DIAGNOSES: 1. Resolving right middle lobe pneumonia. 2. Coronary artery disease, status post anteroseptal wall myocardial    infarction. 3. Hypertension. 4. Non-insulin-dependent diabetes mellitus. 5. Hypercholesterolemia. 6. Morbid obesity.  DISCHARGE MEDICATIONS: 1. Tequin 400 mg one tablet daily x 7 days. 2. Atenolol 25 mg one tablet daily. 3. Altace 2.5 mg one capsule daily. 4. Lipitor 10 mg one tablet daily. 5. Glucotrol XL 10 mg one tablet daily in the morning. 6. Enteric-coated aspirin 325 mg one tablet daily. 7. Xanax 0.25 mg one tablet twice daily as before.  ACTIVITY:  As tolerated.  DIET:  Low salt, low cholesterol, 1800 calorie ADA diet.  The patient has been advised to monitor blood sugar twice daily as before.  FOLLOWUP:  Chest x-ray PA and lateral in one week.  Follow up with me in one week.  CONDITION ON DISCHARGE:  Stable.  BRIEF HISTORY AND HOSPITAL COURSE:  Cody [name] is a 45 year old white male with past medical history significant for coronary artery disease, status post anteroseptal wall MI, status post PTCA and stenting to LAD in the past, hypertension, non-insulin-dependent diabetes mellitus, hypercholesterolemia, history of tobacco abuse, complains of cough with brownish yellow phlegm associated with blood tinged sputum and right-sided pleuritic chest pain with mild shortness of breath for last two days.  He felt chills associated with generalized body ache and vomiting  x 1.  No rigors.  Denies anginal pain, denies palpation, lightheadedness.  Denies abdominal pain, diarrhea, or joint pain.  Chest x-ray done in the ER showed impressive right middle lobe infiltrate with white count of 20,000.  PAST MEDICAL HISTORY:  As above.  PAST SURGICAL HISTORY: Right knee surgery many years ago.  SOCIAL HISTORY:  He is married.  Lives in [place].  Works at [employer]. Smoked two packs per day for 10 years, quit in December 1999 after myocardial infarction.  No history of alcohol abuse.  FAMILY HISTORY:  Father died of CA of lung.  Mother died of motor vehicle accident.  He has one sister in good health.  MEDICATIONS AT HOME: 1. Atenolol 25 mg p.o. q.d. 2. Lipitor 10 mg p.o. q.d. 3. Altace 2.5 mg p.o. q.d. 4. Enteric-coated aspirin one tablet daily. 5. Glucotrol XL 10 mg p.o. q.a.m. 6. Xanax 0.25 mg p.o. b.i.d.  PHYSICAL EXAMINATION:  GENERAL:  He was alert, awake, and oriented x 3 in no acute distress.  VITAL SIGNS:  Blood pressure was 121/73, pulse 96 and regular.  HEENT:  Conjunctivae was pink.  NECK:  Supple, no JVD, no bruit.  LUNGS:  There were decreased breath sounds at right base with occasional rhonchi and dullness to percussion.  Left lung was clear to auscultation.  CARDIOVASCULAR:  S1 and S2 was normal.  There was no rub.  There was no murmur or S3 gallop.  ABDOMEN:  Soft, bowel sounds  were present, nontender.  EXTREMITIES:  No clubbing, cyanosis, or edema.  LABORATORY DATA:  Chest x-ray showed right middle lobe infiltrate.  Hemoglobin was 17, hematocrit 50, white count of 20.5, BUN 12, creatinine 1.0.  Blood cultures last four days have been negative.  His sodium was 138, potassium 3.8, chloride 96, bicarbonate 28, glucose was 110.  Repeat CBC on March 19, 2000, hemoglobin was 14.5, hematocrit 39.2, white count 15.1.  Repeat CBC on March 21, 2000, hemoglobin 14, hematocrit 39.8, white count had come down to 8.0.  His  cholesterol was 116, HDL 29, LDL 59.  BRIEF HOSPITAL COURSE:  The patient was admitted to a regular floor.  ______ were obtained, and was started on IV antibiotics.  The patient remained afebrile over last three days.  White count has come down from 20.5 to 8000. Chest x-ray showed improving aeration and resolution of right middle lobe infiltrate.  The patient is eager to go home.  Will be discharged home today on p.o. antibiotics.  Will be followed up in my office in one week.  We will get a repeat chest x-ray prior to the next office visit.  The patient has been advised if he has any problems breathing, any fever, shortness of breath, or any pleuritic chest pain or worsening in breathing he should report to me immediately. DD:  03/22/00 TD:  03/23/00 Job: [id number]

## 2010-11-19 ENCOUNTER — Encounter (HOSPITAL_COMMUNITY)
Admission: RE | Admit: 2010-11-19 | Discharge: 2010-11-19 | Disposition: A | Discharge status: Home / Self Care | Payer: PRIVATE HEALTH INSURANCE | Source: Ambulatory Visit | Attending: Surgery | Admitting: Surgery

## 2010-11-19 LAB — COMPREHENSIVE METABOLIC PANEL
ALT: 22 U/L (ref 0–53)
AST: 18 U/L (ref 0–37)
Albumin: 3.9 g/dL (ref 3.5–5.2)
Alkaline Phosphatase: 63 U/L (ref 39–117)
Chloride: 100 mEq/L (ref 96–112)
GFR calc Af Amer: >60 mL/min (ref >60)
Potassium: 5 mEq/L (ref 3.5–5.1)
Sodium: 136 mEq/L (ref 135–145)
Total Bilirubin: 0.3 mg/dL (ref 0.3–1.2)
Total Protein: 6.3 g/dL (ref 6.0–8.3)

## 2010-11-19 LAB — CBC
MCV: 96.2 fL (ref 78.0–100.0)
Platelets: 209 10*3/uL (ref 150–400)
RBC: 4.75 MIL/uL (ref 4.22–5.81)
RDW: 13.9 % (ref 11.5–15.5)
WBC: 9.2 10*3/uL (ref 4.0–10.5)

## 2010-11-19 LAB — SURGICAL PCR SCREEN: MRSA, PCR: NEGATIVE

## 2010-11-20 ENCOUNTER — Inpatient Hospital Stay (HOSPITAL_COMMUNITY)
Admission: AD | Admit: 2010-11-20 | Discharge: 2010-11-23 | DRG: 348 | Disposition: A | Discharge status: Home Health | Payer: PRIVATE HEALTH INSURANCE | Source: Ambulatory Visit | Attending: General Surgery | Admitting: General Surgery

## 2010-11-20 ENCOUNTER — Ambulatory Visit (HOSPITAL_COMMUNITY): Admission: RE | Admit: 2010-11-20 | Payer: PRIVATE HEALTH INSURANCE | Source: Ambulatory Visit | Admitting: Surgery

## 2010-11-20 ENCOUNTER — Other Ambulatory Visit: Payer: Self-pay | Admitting: General Surgery

## 2010-11-20 DIAGNOSIS — Z01812 Encounter for preprocedural laboratory examination: Secondary | ICD-10-CM

## 2010-11-20 DIAGNOSIS — I10 Essential (primary) hypertension: Secondary | ICD-10-CM | POA: Diagnosis present

## 2010-11-20 DIAGNOSIS — K612 Anorectal abscess: Principal | ICD-10-CM | POA: Diagnosis present

## 2010-11-20 DIAGNOSIS — IMO0002 Reserved for concepts with insufficient information to code with codable children: Secondary | ICD-10-CM | POA: Diagnosis not present

## 2010-11-20 DIAGNOSIS — Y9210 Unspecified residential institution as the place of occurrence of the external cause: Secondary | ICD-10-CM | POA: Diagnosis not present

## 2010-11-20 DIAGNOSIS — E119 Type 2 diabetes mellitus without complications: Secondary | ICD-10-CM | POA: Diagnosis present

## 2010-11-20 DIAGNOSIS — Y838 Other surgical procedures as the cause of abnormal reaction of the patient, or of later complication, without mention of misadventure at the time of the procedure: Secondary | ICD-10-CM | POA: Diagnosis not present

## 2010-11-20 DIAGNOSIS — G4730 Sleep apnea, unspecified: Secondary | ICD-10-CM | POA: Diagnosis present

## 2010-11-20 DIAGNOSIS — L732 Hidradenitis suppurativa: Secondary | ICD-10-CM | POA: Diagnosis present

## 2010-11-20 DIAGNOSIS — I2510 Atherosclerotic heart disease of native coronary artery without angina pectoris: Secondary | ICD-10-CM | POA: Diagnosis present

## 2010-11-20 HISTORY — PX: RECTAL SURGERY: SHX760

## 2010-11-20 HISTORY — PX: APPLICATION OF WOUND VAC: SHX5189

## 2010-11-20 LAB — GRAM STAIN: Gram Stain: NONE SEEN

## 2010-11-20 LAB — GLUCOSE, CAPILLARY
Glucose-Capillary: 143 mg/dL — ABNORMAL HIGH (ref 70–99)
Glucose-Capillary: 127 mg/dL — ABNORMAL HIGH (ref 70–99)

## 2010-11-21 LAB — GLUCOSE, CAPILLARY
Glucose-Capillary: 87 mg/dL (ref 70–99)
Glucose-Capillary: 110 mg/dL — ABNORMAL HIGH (ref 70–99)
Glucose-Capillary: 229 mg/dL — ABNORMAL HIGH (ref 70–99)
Glucose-Capillary: 112 mg/dL — ABNORMAL HIGH (ref 70–99)

## 2010-11-21 LAB — CBC
MCHC: 32.9 g/dL (ref 30.0–36.0)
RDW: 14 % (ref 11.5–15.5)

## 2010-11-22 LAB — GLUCOSE, CAPILLARY
Glucose-Capillary: 143 mg/dL — ABNORMAL HIGH (ref 70–99)
Glucose-Capillary: 114 mg/dL — ABNORMAL HIGH (ref 70–99)

## 2010-11-23 LAB — GLUCOSE, CAPILLARY: Glucose-Capillary: 182 mg/dL — ABNORMAL HIGH (ref 70–99)

## 2010-11-23 LAB — TISSUE CULTURE: Culture: NO GROWTH

## 2010-11-25 LAB — ANAEROBIC CULTURE

## 2010-11-30 ENCOUNTER — Emergency Department (HOSPITAL_BASED_OUTPATIENT_CLINIC_OR_DEPARTMENT_OTHER)
Admission: EM | Admit: 2010-11-30 | Discharge: 2010-11-30 | Disposition: A | Discharge status: Home / Self Care | Payer: PRIVATE HEALTH INSURANCE | Attending: Emergency Medicine | Admitting: Emergency Medicine

## 2010-11-30 ENCOUNTER — Emergency Department (INDEPENDENT_AMBULATORY_CARE_PROVIDER_SITE_OTHER): Payer: PRIVATE HEALTH INSURANCE

## 2010-11-30 DIAGNOSIS — Z79899 Other long term (current) drug therapy: Secondary | ICD-10-CM | POA: Insufficient documentation

## 2010-11-30 DIAGNOSIS — I252 Old myocardial infarction: Secondary | ICD-10-CM | POA: Insufficient documentation

## 2010-11-30 DIAGNOSIS — M169 Osteoarthritis of hip, unspecified: Secondary | ICD-10-CM

## 2010-11-30 DIAGNOSIS — E119 Type 2 diabetes mellitus without complications: Secondary | ICD-10-CM | POA: Insufficient documentation

## 2010-11-30 DIAGNOSIS — R1033 Periumbilical pain: Secondary | ICD-10-CM

## 2010-11-30 DIAGNOSIS — F17200 Nicotine dependence, unspecified, uncomplicated: Secondary | ICD-10-CM | POA: Insufficient documentation

## 2010-11-30 DIAGNOSIS — E785 Hyperlipidemia, unspecified: Secondary | ICD-10-CM | POA: Insufficient documentation

## 2010-11-30 DIAGNOSIS — N200 Calculus of kidney: Secondary | ICD-10-CM | POA: Insufficient documentation

## 2010-11-30 DIAGNOSIS — R110 Nausea: Secondary | ICD-10-CM

## 2010-11-30 LAB — URINALYSIS, ROUTINE W REFLEX MICROSCOPIC
Ketones, ur: NEGATIVE mg/dL
Leukocytes, UA: NEGATIVE
Protein, ur: NEGATIVE mg/dL
Urobilinogen, UA: 1 mg/dL (ref 0.0–1.0)

## 2010-11-30 LAB — COMPREHENSIVE METABOLIC PANEL
BUN: 13 mg/dL (ref 6–23)
CO2: 24 mEq/L (ref 19–32)
Calcium: 9 mg/dL (ref 8.4–10.5)
Creatinine, Ser: 0.8 mg/dL (ref 0.50–1.35)
GFR calc Af Amer: >60 mL/min (ref >60)
GFR calc non Af Amer: >60 mL/min (ref >60)
Glucose, Bld: 253 mg/dL — ABNORMAL HIGH (ref 70–99)
Total Bilirubin: 0.2 mg/dL — ABNORMAL LOW (ref 0.3–1.2)

## 2010-11-30 LAB — DIFFERENTIAL
Eosinophils Absolute: 0.1 10*3/uL (ref 0.0–0.7)
Lymphocytes Relative: 18 % (ref 12–46)
Lymphs Abs: 1.9 10*3/uL (ref 0.7–4.0)
Monocytes Relative: 8 % (ref 3–12)
Neutrophils Relative %: 73 % (ref 43–77)

## 2010-11-30 LAB — CBC
HCT: 38 % — ABNORMAL LOW (ref 39.0–52.0)
MCH: 32.8 pg (ref 26.0–34.0)
MCV: 94.3 fL (ref 78.0–100.0)
Platelets: 263 10*3/uL (ref 150–400)
RBC: 4.03 MIL/uL — ABNORMAL LOW (ref 4.22–5.81)

## 2010-11-30 LAB — URINE MICROSCOPIC-ADD ON

## 2010-11-30 LAB — LIPASE, BLOOD: Lipase: 48 U/L (ref 11–59)

## 2010-11-30 MED ORDER — IOHEXOL 300 MG/ML  SOLN
100.0000 mL | Freq: Once | INTRAMUSCULAR | Status: AC | PRN
  Administered 2010-11-30: 100 mL via INTRAVENOUS

## 2010-12-07 NOTE — Discharge Summary (Addendum)
  NAMEHULON, Cody [name]                   ACCOUNT NO.:  [id number]  MEDICAL RECORD NO.:  [id number]  LOCATION:  1522                         FACILITY:  [facility]  PHYSICIAN:  Cody Park. Daphine Deutscher, MD  DATE OF BIRTH:  1965-08-30  DATE OF ADMISSION:  11/20/2010 DATE OF DISCHARGE:  11/23/2010                              DISCHARGE SUMMARY   HISTORY OF PRESENT ILLNESS:  Cody [name] is a 45 year old gentleman, who had been seeing Dr. Abigail [name] in the office for evaluation of chronic right buttock abscess.  He had a scheduled elective surgery, but however due to a scheduling conflict, Dr. Zachery [name] agreed to perform the patient's excisional debridement.  SUMMARY OF HOSPITAL COURSE:  Patient was admitted to the floor on November 20, 2010.  He was taken to the operating room and underwent excisional debridement of the right buttock wound.  The patient did have some gross bleeding postoperatively and actually had to be taken back to evacuate a hematoma.  [name] wound has had a VAC on it since the time of surgery, but is partially closed.  The [facility] was changes as of today November 23, 2010 and looks excellent.  Patient's cultures have not shown anything as far as growth.  At present time, his white blood cell count has remained normal at 9.4.  The decision that the patient would benefit from a wound VAC was made.  The proper forms were submitted and the patient has been approved for home wound VAC use therefore home health has been arranged to perform the dressing change for him.  He has otherwise done well with regards to his associated medical problems including hypertension and diabetes.  We feel he is appropriate for discharge at present time.  DISCHARGE DIAGNOSIS: 1. Chronic right buttock wound/abscess, status post excisional     debridement and drainage. 2. Diabetes mellitus - Controlled. 3. Coronary artery disease - Stable. 4. Hypertension - Stable.  DISCHARGE MEDICATIONS:  Include: 1.  Lipitor 20 mg daily. 2. Januvia 100 mg daily. 3. Klor-Con 20 mEq daily. 4. Aspirin 325 mg daily. 5. Xanax 0.5 mg twice daily. 6. Atenolol 25 mg daily. 7. Altace 10 mg twice daily. 8. Glucotrol 10 mg twice daily. 9. Metformin 1000 mg twice daily. 10.Celexa 40 mg daily. 11.Effient 10 mg daily. 12.Lasix 80 mg daily. 13.He will be given a prescription for Percocet 5/325 one to two     tablets q.4h. p.r.n. pain. 14.He is given a prescription for Septra DS 1 tablet p.o. b.i.d. x10     days.  Wound care orders included a [employer] wound VAC to the right buttock.  Wound change, Monday, Wednesday, Friday.  The patient needs to contact our office for follow-up appointment in approximately 2 weeks' time with either Dr. Zachery [name] or Dr. Abigail [name].     Cody El, PA-C   ______________________________ Cody Park Daphine Deutscher, MD    KB/MEDQ  D:  11/23/2010  T:  11/23/2010  Job:  [id number]  Electronically Signed by Cody [name]  on 12/07/2010 02:36:08 PM Electronically Signed by Cody Murphy MD on 12/15/2010 10:16:09 AM

## 2010-12-09 ENCOUNTER — Encounter (INDEPENDENT_AMBULATORY_CARE_PROVIDER_SITE_OTHER): Payer: Self-pay | Admitting: Surgery

## 2010-12-11 ENCOUNTER — Ambulatory Visit (INDEPENDENT_AMBULATORY_CARE_PROVIDER_SITE_OTHER): Payer: PRIVATE HEALTH INSURANCE | Admitting: Surgery

## 2010-12-11 DIAGNOSIS — Z9889 Other specified postprocedural states: Secondary | ICD-10-CM

## 2010-12-11 NOTE — Progress Notes (Signed)
Subjective:     Patient ID: Cody [name], male   DOB: 1966/05/31, 45 y.o.   MRN: [id number]    There were no vitals taken for this visit.    HPI  Patient here today for another wound check. He still has a wound VAC in place. Review of Systems     Objective:   Physical Exam    On exam, the wound is healing well. I removed the sutures and placed a wet-to-dry dressing. There was no bleeding. Assessment:       Patient with a chronic wound receiving wound VAC care on his right buttocks Plan:        Continue wound VAC and followup with Dr. Zachery Dakins next week

## 2010-12-21 LAB — FUNGUS CULTURE W SMEAR

## 2010-12-22 NOTE — Op Note (Signed)
Cody [name], Cody [name]                   ACCOUNT NO.:  [id number]  MEDICAL RECORD NO.:  [id number]  LOCATION:  DAYL                         FACILITY:  [facility]  PHYSICIAN:  Anselm Pancoast. Reianna Batdorf, M.D.DATE OF BIRTH:  Sep 24, 1965  DATE OF PROCEDURE: DATE OF DISCHARGE:                              OPERATIVE REPORT   DIAGNOSIS:  Bleeding postoperative, immediately following excision of chronic hidradenitis/multiple small abscesses, right buttocks, right perianal area.  OPERATION:  Open up wound, suture of bleeding close to the perianal area where he has had this chronic cyst that had been excised.  I partially closed the wound close to the anus and then put a wound VAC for control of skin and hopefully not needing a skin graft.  HISTORY:  Cody [name] is a patient of Dr. Abigail Miyamoto, who had a chronic infection, a black area of the buttocks to the right of his anus.  He is a diabetic, cardiovascular problems, this has been I and D'd in the office by Dr. Magnus Ivan, but no organisms and then the area was excised to be debrided in the operating room by Dr. Magnus Ivan. Because of illness in his family, he passed the patient off.  I had just taken him to the operating room and excised the area, the Gram stains and etc., are not showing bacteria and I tried to close the wound by advancing the lateral skin somewhat and then used a Blake drain.  In moving over from the OR table, he was in a prone position, obviously something gave way and there was significant amount of bleeding from the wound when I first reexamined him in the operating room, probably 15 minutes after surgery.  I recommended that we go back to the operating room.  His wife was in agreement and signed the permission.  No additional antibiotics and we rolled him in to OR-1.  Dr. Shireen Quan replaced the endotracheal tube with him in a prone position using the guide scope and then we put him up laterally on a bean bag.  The wound when you  press on it, there is probably 300 cc of old bloody drainage and I took the stitches out quickly and removed the Monroe drain.  The active bleeder was a little arterial bleeder down close to the anus that with all the sutures out and thoroughly irrigated, you could see the area and I sutured it with 4-0 Vicryl for good hemostasis.  It appears that really especially in the lateral position that this is too tight to try to get any type of skin approximation and I think it would be better if I would try to close the little part going close to the anus, so that we will have an inch or so of skin and then see if we get a wound VAC on him to hopefully get the area to shrink down and then whether delay closure or exactly what will be determined.  The Gram stain from the pathologist says they have seen no bacteria and of course in the office, we were not able to demonstrate any bacteria either or on a culture that Dr. Magnus Ivan did.  The medium-sized wound VAC  actually cut the sponge down, so it is about the size of 2 by maybe 3 inches and then placed that within the wound and then put the adhesive strips, putting 2 actually close to the anus.  The anus itself of course is not covered and he should be able to have a bowel movement if he needs to.  I then put a third little piece laterally and then with the little suction apparatus that was placed in the center and put it up to suction, it shrinks then and this will be a better method of trying to control it than trying to do this with sutures, Webril, and Blake drain as I had tried the first visit to the OR.  The patient was turned flat, extubated and sent to recovery room in stable postop condition.  We will check hemoglobin, hematocrit and continue the postoperative management.     Anselm Pancoast. Zachery Dakins, M.D.     WJW/MEDQ  D:  11/20/2010  T:  11/20/2010  Job:  [id number]  Electronically Signed by Consuello Bossier M.D. on 12/22/2010 03:48:16  PM

## 2010-12-22 NOTE — Op Note (Signed)
NAMEJAVANNI, MARING                   ACCOUNT NO.:  [id number]  MEDICAL RECORD NO.:  [id number]  LOCATION:  DAYL                         FACILITY:  [facility]  PHYSICIAN:  Anselm Pancoast. Kenichi Cassada, M.D.DATE OF BIRTH:  February 22, 1966  DATE OF PROCEDURE:  11/20/2010 DATE OF DISCHARGE:                              OPERATIVE REPORT   PREOPERATIVE DIAGNOSES:  Chronic skin perianal infection, sounds like a possibly hidradenitis, diabetes mellitus, history of cardiovascular disease, exogenous obesity, probably sleep apnea.  POSTOPERATIVE DIAGNOSIS:  Chronic skin perianal infection, sounds like a possibly hidradenitis, diabetes mellitus, history of cardiovascular disease, exogenous obesity, probably sleep apnea.  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  Operation was excision of chronic infection and incisions about 15 cm in length and the area measured probably 5 to 6 cm or 7 cm in width.  HISTORY:  Cody [name] is a chronic overweight diabetic 45 year old who has had 2 previous MIs and he is followed by Dr. Sharyn Lull for his cardiovascular and diabetes.  Plavix has been discontinued.  He has got this chronic infection and discolored area on the buttocks, not really around the anus or communicating to the true anus and then several little areas that look like fistulas.  In the office, Dr. Magnus Ivan I and D'd the area.  The cultures did not show any definite organisms and he has been on chronic Septra and Dr. Magnus Ivan had scheduled him for excision biopsy and closure of this area and because of sickness in Dr. Eliberto Ivory family, he was asked that I could manage the patient.  On the examination preoperatively and that is the first time I had seen the patient, he has got a fairly large area that is probably 10-12 cm in length so to shape like a big tadpole with a large area of  marked darkening of the skin and subcutaneous tissue and then closer to the anus little areas that feel like little fluctuant pockets.  I could not see where Dr. Simeon Craft had done a careful rectal exam in the office and I discussed with the patient that I need to do him in the prone position and I wanted to examine the anus very carefully to make sure that this is not some type of chronic fistula in ano.  The patient is in agreement.  We gave him 1 g of vancomycin starting with surgery and the patient's family has been exposed to MRSA, but we have not grown any type of MRSA or any other bacteria that I can tell when it was previously cultured.  The patient was first intubated with a guide scope by the anesthesiologist and anesthesia team, then we rolled him over on the OR table that could be Jack-knifed and then taped his buttocks apart.  First, I kind of manually removed much of stool out of his anus and then with a hemostat kind of probing these little superficial areas that go close to the anus, but not really truly communicate with the anus and it kind of goes more to the actual buttocks.  After anoscopic exam  was done with the retractors and the poop had been removed, I then washed out his rectum with soapy Betadine solution and then used a second prep to actually prep the area where we were planning to do the surgery.  His buttocks were then taped apart.  So like a hemorrhoidectomy, there is nothing on the left side, but the right side is the area of chronic infection.  The first thing I did after he was draped in sterile manner and then prepped with Betadine is then over the 2 areas that looked most promising, an incision was made and what we found is that this area, the thickness of the lesion or whatever is about 1 cm or so and it looks like there are multiple little chronic abscesses that have kind of mucinous-type material in them, but not that of a frank pus.  I swabbed these  areas with aerobic and anaerobic swabs and then trying to figure out what is best way of trying to manage this since it is not a frank abscess and are definitely fluctuant areas that are kind of closer towards the actual anus.  I elected then to go ahead and start elevating little skin flaps, removing all the grossly abnormal tissue and after the area was completely excised, there was a fairly large area down closer to the anus, there were several little pockets that appeared to be kind of lined with skin-like material, but I could not find anything that is actually going deep like a true fistula in ano, etc.  The excess tissue laterally was more and I kind of undermined it slightly after I had removed everything and then on the back table, I cut through the various specimen and one of the little pockets that was kind of like a little chronic abscess, I sent a portion of this in a sterile container so that the pathologist can reculture or whatever if the original gram stains do not show anything.  I actually went and talked with Brain Hilts after surgery and she would get fungus cultures and etc., but whether this is kind of one of these opportunistic type of chronic infections, we are not sure.  The wound itself after the hemostasis was done predominantly with cautery, I did suture 4 or 5 little areas with 3-0 chromic and then undermined it a little bit lateral so that the skin could be brought together, whether or not it is going to heal or whether we can just kind of bring it together initially and then may have to open incision and it may actually need a skin graft at a later time.  I stopped and tried to see if there was any way we could put kind of a wound VAC on the area and what I elected to end up doing is putting a Blake drain fully floated flap brought out lateral and then closed the skin over it with even 2-0 or 3-0 nylon sutures and then put a little piece of Adaptic right  over the skin edge and then a large piece of Tegaderm, so that we can get somewhat of a seal with the Blake drain, kind of a poor-man's wound VAC. The area is too close to the anus to get any type of these wound VACs that we use for the abdominal incisions since the strip, this little area where the fistulas went go within 1 inch of the dentate line area. [name] wife understands that if we are getting problems that  look like wound infection or whether, all the sutures will need to be taken out and wet to drys and etc., and then he just may need a skin graft at a later time.  Hopefully, we will get the results of the cultures and what best antibiotic should be and hopefully this will get the wound closed if not in primary, more of a second or third step.     Anselm Pancoast. Zachery Dakins, M.D.     WJW/MEDQ  D:  11/20/2010  T:  11/20/2010  Job:  [id number]  Electronically Signed by Consuello Bossier M.D. on 12/22/2010 03:48:07 PM

## 2010-12-23 ENCOUNTER — Encounter (INDEPENDENT_AMBULATORY_CARE_PROVIDER_SITE_OTHER): Payer: Self-pay | Admitting: Surgery

## 2010-12-28 ENCOUNTER — Encounter (INDEPENDENT_AMBULATORY_CARE_PROVIDER_SITE_OTHER): Payer: PRIVATE HEALTH INSURANCE | Admitting: Surgery

## 2011-01-01 ENCOUNTER — Encounter (INDEPENDENT_AMBULATORY_CARE_PROVIDER_SITE_OTHER): Payer: Self-pay | Admitting: General Surgery

## 2011-01-01 ENCOUNTER — Ambulatory Visit (INDEPENDENT_AMBULATORY_CARE_PROVIDER_SITE_OTHER): Payer: PRIVATE HEALTH INSURANCE | Admitting: General Surgery

## 2011-01-01 DIAGNOSIS — L723 Sebaceous cyst: Secondary | ICD-10-CM

## 2011-01-01 NOTE — Patient Instructions (Signed)
Minimal one pack stated it smaller dressing and not taped this tight and best change after the patient has a bowel movement. He needs one or possibly 2 showers a day with no dressings at a convenient time avoid prolonged sitting putting pressure on the incision to light Neosporin with a dry dressing over it is fine

## 2011-01-01 NOTE — Progress Notes (Signed)
Subjective:     Patient ID: Cody [name], male   DOB: 27-Nov-1965, 45 y.o.   MRN: [id number]  HPI The patient had excision of a large chronic wound sebaceous cyst is what the final path port showed that I performed while Dr. Simeon Craft daughter was hospitalized for Dr. Simeon Craft he had active bleeding immediately afterwards not taken back to the operating room and closed it with a wound VAC please nail healing nicely the wound is granulated and should be completely closed an additional 2-3 weeks the wound VAC is no longer needed and we'll applied just a light Neosporin immediately on the open area applied after the patient showers once or twice a day  As the nurses will see the patient one time next week but no flow of visits unless there are services are needed.  Review of Systems     Objective:   Physical Exam     Assessment:        Plan:    Next visit will be approximately 2-1/2 weeks but if the patient's wife feels the wound does not look well and continued to shrink she'll call and I'm happy to see him sooner. Distant nurses will see him one time next week but no father when packs I did give him a prescription for Percocet 20 tablets and he says he's had none with the exception of when I gave him appointment 3 weeks earlier. I closed the wound will be completely contracted and epithelialized in approximately 3 or 4 weeks.

## 2011-01-04 LAB — AFB CULTURE WITH SMEAR (NOT AT ARMC)

## 2011-01-14 ENCOUNTER — Other Ambulatory Visit (HOSPITAL_COMMUNITY): Payer: Self-pay | Admitting: Cardiology

## 2011-01-19 ENCOUNTER — Encounter (INDEPENDENT_AMBULATORY_CARE_PROVIDER_SITE_OTHER): Payer: Self-pay | Admitting: Surgery

## 2011-01-20 ENCOUNTER — Ambulatory Visit (INDEPENDENT_AMBULATORY_CARE_PROVIDER_SITE_OTHER): Payer: PRIVATE HEALTH INSURANCE | Admitting: General Surgery

## 2011-01-20 ENCOUNTER — Other Ambulatory Visit (HOSPITAL_COMMUNITY): Payer: Self-pay | Admitting: Cardiology

## 2011-01-20 ENCOUNTER — Ambulatory Visit (HOSPITAL_COMMUNITY)
Admission: RE | Admit: 2011-01-20 | Discharge: 2011-01-20 | Disposition: A | Discharge status: Home / Self Care | Payer: PRIVATE HEALTH INSURANCE | Source: Ambulatory Visit | Attending: Cardiology | Admitting: Cardiology

## 2011-01-20 ENCOUNTER — Encounter (INDEPENDENT_AMBULATORY_CARE_PROVIDER_SITE_OTHER): Payer: Self-pay | Admitting: General Surgery

## 2011-01-20 VITALS — HR 68 | Temp 98.3°F

## 2011-01-20 DIAGNOSIS — IMO0001 Reserved for inherently not codable concepts without codable children: Secondary | ICD-10-CM | POA: Insufficient documentation

## 2011-01-20 DIAGNOSIS — G44309 Post-traumatic headache, unspecified, not intractable: Secondary | ICD-10-CM | POA: Insufficient documentation

## 2011-01-20 DIAGNOSIS — L089 Local infection of the skin and subcutaneous tissue, unspecified: Secondary | ICD-10-CM

## 2011-01-20 DIAGNOSIS — L723 Sebaceous cyst: Secondary | ICD-10-CM

## 2011-01-20 DIAGNOSIS — R55 Syncope and collapse: Secondary | ICD-10-CM | POA: Insufficient documentation

## 2011-01-20 DIAGNOSIS — G9389 Other specified disorders of brain: Secondary | ICD-10-CM | POA: Insufficient documentation

## 2011-01-20 DIAGNOSIS — X58XXXS Exposure to other specified factors, sequela: Secondary | ICD-10-CM | POA: Insufficient documentation

## 2011-01-20 LAB — BASIC METABOLIC PANEL
BUN: 10 mg/dL (ref 6–23)
Creatinine, Ser: 0.76 mg/dL (ref 0.50–1.35)
GFR calc Af Amer: >60 mL/min (ref >60)
GFR calc non Af Amer: >60 mL/min (ref >60)

## 2011-01-20 MED ORDER — IOHEXOL 300 MG/ML  SOLN
100.0000 mL | Freq: Once | INTRAMUSCULAR | Status: AC | PRN
  Administered 2011-01-20: 100 mL via INTRAVENOUS

## 2011-01-20 NOTE — Patient Instructions (Signed)
Continue the same wound care the small dressing and triple antibiotic ointment

## 2011-01-20 NOTE — Progress Notes (Signed)
Subjective:     Patient ID: Cody [name], male   DOB: 04-15-1966, 45 y.o.   MRN: [id number]  HPI the patient's n right buttocks it is granulating nicely and is presently on her approximately a 1 cm area of granulation tissue the patient's wife has changed the dressing on a daily basis upon a small amount of Neosporin to the incision and a cover dressing. I think she can decrease the size of the dressing but I would caution him  Slide in on his buttocks as the skin is thin and it may split   Review of Systems     Objective:   Physical ExamThe incision is healing nicely. There is a little pigmentation of the granulating and contracting scan but there is no evidence of infection.     Assessment:   The leg is healing nicely and hopefully will be completely healed when he seen in 3 weeks     Plan:       No change in his stress and schedule and use of a small amount of triple antibiotic ointment

## 2011-01-22 ENCOUNTER — Telehealth (INDEPENDENT_AMBULATORY_CARE_PROVIDER_SITE_OTHER): Payer: Self-pay | Admitting: General Surgery

## 2011-02-02 ENCOUNTER — Institutional Professional Consult (permissible substitution): Payer: PRIVATE HEALTH INSURANCE | Admitting: Pulmonary Disease

## 2011-02-16 ENCOUNTER — Institutional Professional Consult (permissible substitution): Payer: PRIVATE HEALTH INSURANCE | Admitting: Pulmonary Disease

## 2011-02-18 ENCOUNTER — Encounter (INDEPENDENT_AMBULATORY_CARE_PROVIDER_SITE_OTHER): Payer: Self-pay | Admitting: Surgery

## 2011-02-19 ENCOUNTER — Encounter (INDEPENDENT_AMBULATORY_CARE_PROVIDER_SITE_OTHER): Payer: PRIVATE HEALTH INSURANCE | Admitting: General Surgery

## 2011-03-25 ENCOUNTER — Encounter (INDEPENDENT_AMBULATORY_CARE_PROVIDER_SITE_OTHER): Payer: PRIVATE HEALTH INSURANCE | Admitting: General Surgery

## 2011-04-13 ENCOUNTER — Ambulatory Visit (INDEPENDENT_AMBULATORY_CARE_PROVIDER_SITE_OTHER): Payer: BC Managed Care – PPO | Admitting: General Surgery

## 2011-04-13 ENCOUNTER — Encounter (INDEPENDENT_AMBULATORY_CARE_PROVIDER_SITE_OTHER): Payer: Self-pay | Admitting: General Surgery

## 2011-04-13 ENCOUNTER — Encounter (INDEPENDENT_AMBULATORY_CARE_PROVIDER_SITE_OTHER): Payer: PRIVATE HEALTH INSURANCE | Admitting: General Surgery

## 2011-04-13 VITALS — BP 104/72 | HR 72 | Temp 97.7°F | Resp 20 | Ht 67.0 in | Wt 267.2 lb

## 2011-04-13 DIAGNOSIS — L723 Sebaceous cyst: Secondary | ICD-10-CM

## 2011-04-13 NOTE — Patient Instructions (Signed)
Wash up the buttocks very thoroughly with soap and water and washed off daily and have your wife so to check this area probably every week or so if there is a little cyst reoccurring in the area he may need to be reexcised . Hopefully washing the area thoroughly will prevent the need for any further surgery in this area. See me in one month and afterwards he'll need to return to see Dr. Magnus Ivan

## 2011-04-13 NOTE — Progress Notes (Signed)
Subjective:     Patient ID: Cody [name], male   DOB: 23-Feb-1966, 45 y.o.   MRN: [id number]  HPIHeart rate returns now proximal 3 months since I last saw him and his wound is completely epithelialized but his wife says a have a little odor intermittently he saw his regular physician who cultured drop of sebaceous cyst-type material in the teres center of the granulated wound and you can express just a minute of that [name] type material from this toe I healed incision. I would recommend that he scrubbed the area vigorously and not recommend trying to do any additional surgery unless the area is definitely more obvious since the wound will be very difficult to get a primary closure. The patient is aware that on retiring at the end of December and Dr. Magnus Ivan was his surgeon . I will see him for followup in one month and then afterwards he'll be followed by Dr. Magnus Ivan   Review of Systems     Objective:   Physical ExamBP 104/72  Pulse 72  Temp(Src) 97.7 F (36.5 C) (Temporal)  Resp 20  Ht 5\' 7"  (1.702 m)  Wt 267 lb 3.2 oz (121.201 kg)  BMI 41.85 kg/m2 The right is completely epithelialized but there is a very small crypt and a minute amount of cheesy material can be expressed but no evidence of any active infection at this time. I did not appreciate the others that his wife describes and he does have a little bit of perianal excoriation from poor clean and after bowel movement    Assessment:     See me one month    Plan:     See note above

## 2011-05-11 ENCOUNTER — Encounter (HOSPITAL_BASED_OUTPATIENT_CLINIC_OR_DEPARTMENT_OTHER)
Admission: RE | Admit: 2011-05-11 | Discharge: 2011-05-11 | Disposition: A | Discharge status: Home / Self Care | Payer: BC Managed Care – PPO | Source: Ambulatory Visit | Attending: Orthopedic Surgery | Admitting: Orthopedic Surgery

## 2011-05-11 ENCOUNTER — Ambulatory Visit
Admission: RE | Admit: 2011-05-11 | Discharge: 2011-05-11 | Disposition: A | Discharge status: Home / Self Care | Payer: BC Managed Care – PPO | Source: Ambulatory Visit | Attending: Anesthesiology | Admitting: Anesthesiology

## 2011-05-11 ENCOUNTER — Encounter (HOSPITAL_BASED_OUTPATIENT_CLINIC_OR_DEPARTMENT_OTHER): Payer: Self-pay | Admitting: *Deleted

## 2011-05-11 LAB — BASIC METABOLIC PANEL
Calcium: 8.8 mg/dL (ref 8.4–10.5)
Creatinine, Ser: 0.73 mg/dL (ref 0.50–1.35)
GFR calc Af Amer: >90 mL/min (ref >90)

## 2011-05-11 NOTE — Pre-Procedure Instructions (Addendum)
Cardiology clearance by Dr. Sharyn Lull was sent by Dr. Greig Right office  To come for BMET and CXR.

## 2011-05-12 ENCOUNTER — Encounter (INDEPENDENT_AMBULATORY_CARE_PROVIDER_SITE_OTHER): Payer: BC Managed Care – PPO | Admitting: General Surgery
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
## 2011-05-13 ENCOUNTER — Encounter (HOSPITAL_BASED_OUTPATIENT_CLINIC_OR_DEPARTMENT_OTHER): Payer: Self-pay | Admitting: Anesthesiology

## 2011-05-13 ENCOUNTER — Ambulatory Visit (HOSPITAL_BASED_OUTPATIENT_CLINIC_OR_DEPARTMENT_OTHER): Payer: BC Managed Care – PPO | Admitting: Anesthesiology

## 2011-05-13 ENCOUNTER — Encounter (HOSPITAL_BASED_OUTPATIENT_CLINIC_OR_DEPARTMENT_OTHER)
Admission: RE | Disposition: A | Discharge status: Home / Self Care | Payer: Self-pay | Source: Ambulatory Visit | Attending: Orthopedic Surgery

## 2011-05-13 ENCOUNTER — Encounter (HOSPITAL_BASED_OUTPATIENT_CLINIC_OR_DEPARTMENT_OTHER): Payer: Self-pay | Admitting: *Deleted

## 2011-05-13 ENCOUNTER — Ambulatory Visit (HOSPITAL_BASED_OUTPATIENT_CLINIC_OR_DEPARTMENT_OTHER)
Admission: RE | Admit: 2011-05-13 | Discharge: 2011-05-13 | Disposition: A | Discharge status: Home / Self Care | Payer: BC Managed Care – PPO | Source: Ambulatory Visit | Attending: Orthopedic Surgery | Admitting: Orthopedic Surgery

## 2011-05-13 DIAGNOSIS — I2510 Atherosclerotic heart disease of native coronary artery without angina pectoris: Secondary | ICD-10-CM | POA: Insufficient documentation

## 2011-05-13 DIAGNOSIS — Z01812 Encounter for preprocedural laboratory examination: Secondary | ICD-10-CM | POA: Insufficient documentation

## 2011-05-13 DIAGNOSIS — I252 Old myocardial infarction: Secondary | ICD-10-CM | POA: Insufficient documentation

## 2011-05-13 DIAGNOSIS — Z4789 Encounter for other orthopedic aftercare: Secondary | ICD-10-CM

## 2011-05-13 DIAGNOSIS — M6580 Other synovitis and tenosynovitis, unspecified site: Secondary | ICD-10-CM | POA: Insufficient documentation

## 2011-05-13 DIAGNOSIS — E119 Type 2 diabetes mellitus without complications: Secondary | ICD-10-CM | POA: Insufficient documentation

## 2011-05-13 DIAGNOSIS — I10 Essential (primary) hypertension: Secondary | ICD-10-CM | POA: Insufficient documentation

## 2011-05-13 HISTORY — DX: Atherosclerotic heart disease of native coronary artery without angina pectoris: I25.10

## 2011-05-13 HISTORY — PX: ANKLE ARTHROSCOPY: SHX545

## 2011-05-13 HISTORY — DX: Unspecified injury of head, initial encounter: S09.90XA

## 2011-05-13 HISTORY — DX: Presence of coronary angioplasty implant and graft: Z95.5

## 2011-05-13 LAB — POCT HEMOGLOBIN-HEMACUE: Hemoglobin: 13.3 g/dL (ref 13.0–17.0)

## 2011-05-13 LAB — GLUCOSE, CAPILLARY
Glucose-Capillary: 158 mg/dL — ABNORMAL HIGH (ref 70–99)
Glucose-Capillary: 140 mg/dL — ABNORMAL HIGH (ref 70–99)

## 2011-05-13 SURGERY — ARTHROSCOPY, ANKLE
Anesthesia: General | Laterality: Right

## 2011-05-13 MED ORDER — PROMETHAZINE HCL 25 MG/ML IJ SOLN: 6.2500 mg | INTRAMUSCULAR | Status: DC | PRN

## 2011-05-13 MED ORDER — METHYLPREDNISOLONE ACETATE PF 40 MG/ML IJ SUSP
INTRAMUSCULAR | Status: DC | PRN
  Administered 2011-05-13: 4 mg via INTRA_ARTICULAR

## 2011-05-13 MED ORDER — OXYCODONE HCL 5 MG PO TABS
5.0000 mg | ORAL_TABLET | Freq: Once | ORAL | Status: AC
  Administered 2011-05-13: 5 mg via ORAL

## 2011-05-13 MED ORDER — ONDANSETRON HCL 4 MG/2ML IJ SOLN
INTRAMUSCULAR | Status: DC | PRN
  Administered 2011-05-13: 4 mg via INTRAVENOUS

## 2011-05-13 MED ORDER — CHLORHEXIDINE GLUCONATE 4 % EX LIQD: 60.0000 mL | Freq: Once | CUTANEOUS | Status: DC

## 2011-05-13 MED ORDER — MIDAZOLAM HCL 5 MG/5ML IJ SOLN
INTRAMUSCULAR | Status: DC | PRN
  Administered 2011-05-13: 1 mg via INTRAVENOUS

## 2011-05-13 MED ORDER — CEFAZOLIN SODIUM 1-5 GM-% IV SOLN
1.0000 g | INTRAVENOUS | Status: AC
  Administered 2011-05-13: 2 g via INTRAVENOUS

## 2011-05-13 MED ORDER — FENTANYL CITRATE 0.05 MG/ML IJ SOLN
INTRAMUSCULAR | Status: DC | PRN
  Administered 2011-05-13: 100 ug via INTRAVENOUS

## 2011-05-13 MED ORDER — HYDROMORPHONE HCL PF 1 MG/ML IJ SOLN: 0.2500 mg | INTRAMUSCULAR | Status: DC | PRN

## 2011-05-13 MED ORDER — BUPIVACAINE HCL (PF) 0.5 % IJ SOLN
INTRAMUSCULAR | Status: DC | PRN
  Administered 2011-05-13: 10 mL

## 2011-05-13 MED ORDER — LACTATED RINGERS IV SOLN
INTRAVENOUS | Status: DC
  Administered 2011-05-13: 07:00:00 via INTRAVENOUS

## 2011-05-13 MED ORDER — ACETAMINOPHEN 10 MG/ML IV SOLN
1000.0000 mg | Freq: Once | INTRAVENOUS | Status: AC
  Administered 2011-05-13: 1000 mg via INTRAVENOUS

## 2011-05-13 MED ORDER — EPHEDRINE SULFATE 50 MG/ML IJ SOLN
INTRAMUSCULAR | Status: DC | PRN
  Administered 2011-05-13: 15 mg via INTRAVENOUS

## 2011-05-13 MED ORDER — LIDOCAINE HCL (CARDIAC) 20 MG/ML IV SOLN
INTRAVENOUS | Status: DC | PRN
  Administered 2011-05-13: 100 mg via INTRAVENOUS

## 2011-05-13 MED ORDER — PROPOFOL 10 MG/ML IV EMUL
INTRAVENOUS | Status: DC | PRN
  Administered 2011-05-13: 160 mg via INTRAVENOUS

## 2011-05-13 SURGICAL SUPPLY — 53 items
BANDAGE ELASTIC 4 VELCRO ST LF (GAUZE/BANDAGES/DRESSINGS) ×2 IMPLANT
BLADE 4.2CUDA (BLADE) IMPLANT
BLADE CUDA GRT WHITE 3.5 (BLADE) IMPLANT
BLADE CUDA SHAVER 3.5 (BLADE) ×1 IMPLANT
BLADE CUTTER GATOR 3.5 (BLADE) IMPLANT
BLADE GREAT WHITE 4.2 (BLADE) IMPLANT
BNDG CMPR 9X4 STRL LF SNTH (GAUZE/BANDAGES/DRESSINGS) ×1
BNDG ESMARK 4X9 LF (GAUZE/BANDAGES/DRESSINGS) ×2 IMPLANT
BUR CUDA 2.9 (BURR) IMPLANT
BUR FULL RADIUS 2.9 (BURR) IMPLANT
BUR OVAL 4.0 (BURR) IMPLANT
BUR OVAL 6.0 (BURR) IMPLANT
CANISTER OMNI JUG 16 LITER (MISCELLANEOUS) ×1 IMPLANT
CANISTER SUCTION 2500CC (MISCELLANEOUS) IMPLANT
CLOTH BEACON ORANGE TIMEOUT ST (SAFETY) ×2 IMPLANT
CUFF TOURNIQUET SINGLE 34IN LL (TOURNIQUET CUFF) IMPLANT
CUTTER MENISCUS  4.2MM (BLADE)
CUTTER MENISCUS 4.2MM (BLADE) IMPLANT
DECANTER SPIKE VIAL GLASS SM (MISCELLANEOUS) IMPLANT
DRAPE ARTHROSCOPY W/POUCH 90 (DRAPES) ×2 IMPLANT
DRAPE OEC MINIVIEW 54X84 (DRAPES) IMPLANT
DRAPE SURG 17X23 STRL (DRAPES) ×2 IMPLANT
DURAPREP 26ML APPLICATOR (WOUND CARE) ×2 IMPLANT
ELECT MENISCUS 165MM 90D (ELECTRODE) IMPLANT
ELECT REM PT RETURN 9FT ADLT (ELECTROSURGICAL) ×2
ELECTRODE REM PT RTRN 9FT ADLT (ELECTROSURGICAL) ×1 IMPLANT
GAUZE XEROFORM 1X8 LF (GAUZE/BANDAGES/DRESSINGS) ×2 IMPLANT
GLOVE BIO SURGEON STRL SZ 6.5 (GLOVE) ×1 IMPLANT
GLOVE BIO SURGEON STRL SZ7.5 (GLOVE) ×1 IMPLANT
GLOVE BIOGEL PI IND STRL 8 (GLOVE) ×1 IMPLANT
GLOVE BIOGEL PI INDICATOR 8 (GLOVE) ×2
GLOVE INDICATOR 7.0 STRL GRN (GLOVE) ×1 IMPLANT
GLOVE ORTHO TXT STRL SZ7.5 (GLOVE) ×5 IMPLANT
GOWN BRE IMP PREV XXLGXLNG (GOWN DISPOSABLE) ×2 IMPLANT
GOWN PREVENTION PLUS XLARGE (GOWN DISPOSABLE) ×2 IMPLANT
IV NS IRRIG 3000ML ARTHROMATIC (IV SOLUTION) ×1 IMPLANT
NDL KEITH (NEEDLE) IMPLANT
NEEDLE KEITH (NEEDLE) IMPLANT
PACK ARTHROSCOPY DSU (CUSTOM PROCEDURE TRAY) ×2 IMPLANT
PACK BASIN DAY SURGERY FS (CUSTOM PROCEDURE TRAY) ×2 IMPLANT
PENCIL BUTTON HOLSTER BLD 10FT (ELECTRODE) IMPLANT
SET ARTHROSCOPY TUBING (MISCELLANEOUS) ×2
SET ARTHROSCOPY TUBING LN (MISCELLANEOUS) ×1 IMPLANT
SLEEVE SCD COMPRESS KNEE MED (MISCELLANEOUS) ×1 IMPLANT
SLEEVE SURGEON STRL (DRAPES) ×1 IMPLANT
SPONGE GAUZE 4X4 12PLY (GAUZE/BANDAGES/DRESSINGS) ×4 IMPLANT
STOCKINETTE 4X48 STRL (DRAPES) ×2 IMPLANT
STRAP ANKLE FOOT DISTRACTOR (ORTHOPEDIC SUPPLIES) IMPLANT
SUT ETHILON 3 0 PS 1 (SUTURE) ×2 IMPLANT
SUT VIC AB 3-0 SH 27 (SUTURE)
SUT VIC AB 3-0 SH 27X BRD (SUTURE) IMPLANT
TUBE CONNECTING 20X1/4 (TUBING) IMPLANT
WATER STERILE IRR 1000ML POUR (IV SOLUTION) ×2 IMPLANT

## 2011-05-13 NOTE — Transfer of Care (Signed)
Immediate Anesthesia Transfer of Care Note  Patient: Cody [name]  Procedure(s) Performed:  ANKLE ARTHROSCOPY - right ankle arthroscopy with extensive debridement synovectomy and chondroplasty  Patient Location: PACU  Anesthesia Type: General  Level of Consciousness: awake, alert  and oriented  Airway & Oxygen Therapy: Patient Spontanous Breathing and Patient connected to face mask oxygen  Post-op Assessment: Report given to PACU RN and Post -op Vital signs reviewed and stable  Post vital signs: Reviewed and stable  Complications: No apparent anesthesia complications

## 2011-05-13 NOTE — Anesthesia Postprocedure Evaluation (Signed)
  Anesthesia Post-op Note  Patient: Cody [name]  Procedure(s) Performed:  ANKLE ARTHROSCOPY - right ankle arthroscopy with extensive debridement synovectomy and chondroplasty  Patient Location: PACU  Anesthesia Type: General  Level of Consciousness: awake, alert  and oriented  Airway and Oxygen Therapy: Patient Spontanous Breathing  Post-op Pain: mild  Post-op Assessment: Post-op Vital signs reviewed, Patient's Cardiovascular Status Stable, Respiratory Function Stable, Patent Airway, No signs of Nausea or vomiting, Adequate PO intake and Pain level controlled  Post-op Vital Signs: stable  Complications: No apparent anesthesia complications

## 2011-05-13 NOTE — Interval H&P Note (Signed)
History and Physical Interval Note:  05/13/2011 7:26 AM  Cody [name]  has presented today for surgery, with the diagnosis of osteochondritis dissecans  The various methods of treatment have been discussed with the patient and family. After consideration of risks, benefits and other options for treatment, the patient has consented to  Procedure(s): ANKLE ARTHROSCOPY as a surgical intervention .  The patients' history has been reviewed, patient examined, no change in status, stable for surgery.  I have reviewed the patients' chart and labs.  Questions were answered to the patient's satisfaction.     [name] F

## 2011-05-13 NOTE — Brief Op Note (Signed)
05/13/2011  8:19 AM  PATIENT:  Cody [name]  45 y.o. male  PRE-OPERATIVE DIAGNOSIS:  osteochondritis dissecans  POST-OPERATIVE DIAGNOSIS:  meniscoid lesion, synovitis, chondromalacia  PROCEDURE:  Procedure(s): ANKLE ARTHROSCOPY  SURGEON:  Surgeon(s): Loreta Ave, MD  PHYSICIAN ASSISTANT: Zonia Kief M   ANESTHESIA:   general  EBL:  Total I/O In: 1000 [I.V.:1000] Out: -    TOURNIQUET:   Total Tourniquet Time Documented: Thigh (Right) - 16 minutes    PATIENT DISPOSITION:  PACU - hemodynamically stable.   Delay start of Pharmacological VTE agent (>24hrs) due to surgical blood loss or risk of bleeding:  {YES/NO/NOT APPLICABLE:20182

## 2011-05-13 NOTE — Anesthesia Preprocedure Evaluation (Addendum)
Anesthesia Evaluation  Patient identified by MRN, date of birth, ID band Patient awake    Reviewed: Allergy & Precautions, H&P , NPO status , Patient's Chart, lab work & pertinent test results  Airway Mallampati: II TM Distance: >3 FB Neck ROM: Full    Dental  (+)    Pulmonary    Pulmonary exam normal       Cardiovascular hypertension, + CAD and + Past MI     Neuro/Psych    GI/Hepatic   Endo/Other  Diabetes mellitus-  Renal/GU      Musculoskeletal   Abdominal   Peds  Hematology   Anesthesia Other Findings Tooth chipped preop  Reproductive/Obstetrics                          Anesthesia Physical Anesthesia Plan  ASA: III  Anesthesia Plan: General   Post-op Pain Management:    Induction: Intravenous  Airway Management Planned: LMA  Additional Equipment:   Intra-op Plan:   Post-operative Plan: Extubation in OR  Informed Consent: I have reviewed the patients History and Physical, chart, labs and discussed the procedure including the risks, benefits and alternatives for the proposed anesthesia with the patient or authorized representative who has indicated his/her understanding and acceptance.     Plan Discussed with: Surgeon and CRNA  Anesthesia Plan Comments:         Anesthesia Quick Evaluation

## 2011-05-13 NOTE — Anesthesia Procedure Notes (Signed)
Procedure Name: LMA Insertion Performed by: Cyniah Gossard Pre-anesthesia Checklist: Patient identified, Emergency Drugs available, Suction available, Patient being monitored and Timeout performed Patient Re-evaluated:Patient Re-evaluated prior to inductionOxygen Delivery Method: Circle System Utilized Preoxygenation: Pre-oxygenation with 100% oxygen Intubation Type: IV induction Ventilation: Mask ventilation without difficulty LMA: LMA with gastric port inserted LMA Size: 4.0 Number of attempts: 1 Placement Confirmation: positive ETCO2 and breath sounds checked- equal and bilateral Tube secured with: Tape Dental Injury: Teeth and Oropharynx as per pre-operative assessment      

## 2011-05-14 ENCOUNTER — Encounter (HOSPITAL_BASED_OUTPATIENT_CLINIC_OR_DEPARTMENT_OTHER): Payer: Self-pay | Admitting: Orthopedic Surgery

## 2011-05-14 NOTE — Op Note (Signed)
NAME:  Cody [name], Cody [name]                        ACCOUNT NO.:  MEDICAL RECORD NO.:  [id number]  LOCATION:                                 FACILITY:  PHYSICIAN:  Loreta Ave, M.D.      DATE OF BIRTH:  DATE OF PROCEDURE:  05/13/2011 DATE OF DISCHARGE:                              OPERATIVE REPORT   PREOPERATIVE DIAGNOSIS:  Posttraumatic synovitis, meniscoid lesion, right ankle.  POSTOPERATIVE DIAGNOSIS:  Posttraumatic synovitis, meniscoid lesion, right ankle with also a small osteochondral fracture over the anterior distal tibia.  PROCEDURE:  Right ankle exam under anesthesia, arthroscopy.  Excision meniscoid lesion.  Partial synovectomy.  Debridement of the anterior distal tibial osteochondral fragment with chondroplasty to the margin.  SURGEON:  Loreta Ave, MD  ASSISTANT:  Genene Churn. Barry Dienes, [place]  ANESTHESIA:  General.  BLOOD LOSS:  Minimal.  SPECIMENS:  None.  CULTURES:  None.  COMPLICATION:  None.  DRESSING:  Soft compressive.  TOURNIQUET TIME:  40 minutes.  PROCEDURE:  The patient was brought to the operating room, placed on the operating table in supine position.  After adequate anesthesia had been obtained, the ankle examined.  Good motion, good stability.  Tourniquet applied.  Stirrup applied.  Prepped and draped in usual sterile fashion. Exsanguinated with elevation, Esmarch.  Tourniquet inflated to 350 mmHg. Two incisions anterolateral, anteromedial ankle.  Arthroscope induced, ankle distended and inspected.  Focal synovitis anterolateral, anteromedial all debrided.  Thick and more prominent anterolaterally consistent with meniscoid lesion debrided.  No evidence of bony impingement.  Remaining articular cartilage over the talus looked great. Medial and lateral gutters well visualized.  The front of the tibia distally had a chondral and osteochondral flap just encroaching on the anterior joint surface.  This was removed, debrided, and chondroplasty to a  nice stable margin in the front.  Entire ankle examined.  No other findings appreciated.  Instruments were removed.  Ankle injected with Depo-Medrol, Marcaine.  Portals closed with nylon.  Sterile compressive dressing applied.  Anesthesia reversed.  Brought to recovery room. Tolerated surgery well.  No complications.     Loreta Ave, M.D.     DFM/MEDQ  D:  05/13/2011  T:  05/13/2011  Job:  [id number]

## 2011-06-06 ENCOUNTER — Other Ambulatory Visit: Payer: Self-pay | Admitting: Cardiology

## 2011-06-12 ENCOUNTER — Other Ambulatory Visit: Payer: Self-pay | Admitting: Cardiology

## 2011-07-03 ENCOUNTER — Other Ambulatory Visit: Payer: Self-pay | Admitting: Cardiology

## 2011-07-22 ENCOUNTER — Other Ambulatory Visit (HOSPITAL_COMMUNITY): Payer: Self-pay | Admitting: Cardiology

## 2011-07-28 ENCOUNTER — Other Ambulatory Visit: Payer: Self-pay | Admitting: Cardiology

## 2011-07-30 ENCOUNTER — Encounter (HOSPITAL_COMMUNITY)
Admission: RE | Admit: 2011-07-30 | Discharge: 2011-07-30 | Disposition: A | Discharge status: Home / Self Care | Payer: Medicaid Other | Source: Ambulatory Visit | Attending: Cardiology | Admitting: Cardiology

## 2011-07-30 ENCOUNTER — Ambulatory Visit (HOSPITAL_COMMUNITY)
Admission: RE | Admit: 2011-07-30 | Discharge: 2011-07-30 | Disposition: A | Discharge status: Home / Self Care | Payer: Medicaid Other | Source: Ambulatory Visit | Attending: Cardiology | Admitting: Cardiology

## 2011-07-30 DIAGNOSIS — R9431 Abnormal electrocardiogram [ECG] [EKG]: Secondary | ICD-10-CM | POA: Insufficient documentation

## 2011-07-30 DIAGNOSIS — I252 Old myocardial infarction: Secondary | ICD-10-CM | POA: Insufficient documentation

## 2011-07-30 DIAGNOSIS — E7800 Pure hypercholesterolemia, unspecified: Secondary | ICD-10-CM | POA: Insufficient documentation

## 2011-07-30 DIAGNOSIS — R079 Chest pain, unspecified: Secondary | ICD-10-CM | POA: Insufficient documentation

## 2011-07-30 DIAGNOSIS — R5381 Other malaise: Secondary | ICD-10-CM | POA: Insufficient documentation

## 2011-07-30 DIAGNOSIS — E119 Type 2 diabetes mellitus without complications: Secondary | ICD-10-CM | POA: Insufficient documentation

## 2011-07-30 MED ORDER — TECHNETIUM TC 99M TETROFOSMIN IV KIT
30.0000 | PACK | Freq: Once | INTRAVENOUS | Status: AC | PRN
  Administered 2011-07-30: 30 via INTRAVENOUS

## 2011-07-30 MED ORDER — TECHNETIUM TC 99M TETROFOSMIN IV KIT
10.0000 | PACK | Freq: Once | INTRAVENOUS | Status: AC | PRN
  Administered 2011-07-30: 10 via INTRAVENOUS

## 2011-07-30 MED ORDER — REGADENOSON 0.4 MG/5ML IV SOLN: 0.4000 mg | Freq: Once | INTRAVENOUS | Status: AC

## 2011-07-30 MED ORDER — REGADENOSON 0.4 MG/5ML IV SOLN
INTRAVENOUS | Status: AC
  Filled 2011-07-30: qty 5

## 2011-08-11 ENCOUNTER — Encounter: Payer: Self-pay | Admitting: Pulmonary Disease

## 2011-08-11 ENCOUNTER — Ambulatory Visit (INDEPENDENT_AMBULATORY_CARE_PROVIDER_SITE_OTHER): Payer: Medicaid Other | Admitting: Pulmonary Disease

## 2011-08-11 VITALS — BP 102/68 | HR 82 | Temp 97.7°F | Ht 67.0 in | Wt 274.4 lb

## 2011-08-11 DIAGNOSIS — G4733 Obstructive sleep apnea (adult) (pediatric): Secondary | ICD-10-CM

## 2011-08-11 NOTE — Patient Instructions (Signed)
Will schedule for sleep study, and arrange return visit once results are available Work on weight loss

## 2011-08-11 NOTE — Assessment & Plan Note (Signed)
[name] history is classic for clinically significant obstructive sleep apnea.  I have had a long discussion with him about sleep apnea, including its impact on his cardiovascular health and quality of life.  The patient needs to have a sleep study, and will arrange followup once the results are available.  I have also encouraged him to work aggressively on weight loss.

## 2011-08-11 NOTE — Progress Notes (Signed)
  Subjective:    Patient ID: Cody [name], male    DOB: [birth year], 46 y.o.   MRN: [id number]  HPI The patient is a 46 year old male who I've been asked to see for possible sleep apnea.  He is been noted to have loud snoring, as well as an abnormal breathing pattern during sleep.  He has also noted choking arousals.  The wife states that he was noted to have breathing issues during sleep while in the hospital recently.  Many nurses told her that he had sleep apnea.  The patient has frequent awakenings at night, and is not rested in the mornings upon arising.  He notes significant sleep pressured during the day, and will fall asleep anytime he sits down according to the wife.  He falls asleep watching television at night, and also in movie theater.  He has some sleep pressure while driving.  The patient states that his weight is down 15 pounds over the last 2 years, and his Epworth sleepiness score today is 12.  Sleep Questionnaire: What time do you typically go to bed?( Between what hours) 10pm to 12 am How long does it take you to fall asleep? 5-10 mins How many times during the night do you wake up? 4 What time do you get out of bed to start your day? 0530 Do you drive or operate heavy machinery in your occupation? No How much has your weight changed (up or down) over the past two years? (In pounds) Have you ever had a sleep study before? No Do you currently use CPAP? No Do you wear oxygen at any time? No    Review of Systems  Constitutional: Positive for unexpected weight change. Negative for fever.  HENT: Positive for ear pain, congestion and sore throat. Negative for nosebleeds, rhinorrhea, sneezing, trouble swallowing, dental problem, postnasal drip and sinus pressure.   Eyes: Negative for redness and itching.  Respiratory: Positive for cough and shortness of breath. Negative for chest tightness and wheezing.   Cardiovascular: Positive for chest pain and leg swelling. Negative for palpitations.    Gastrointestinal: Negative for nausea and vomiting.  Genitourinary: Negative for dysuria.  Musculoskeletal: Positive for joint swelling.  Skin: Negative for rash.  Neurological: Negative for headaches.  Hematological: Does not bruise/bleed easily.  Psychiatric/Behavioral: Negative for dysphoric mood. The patient is not nervous/anxious.        Objective:   Physical Exam Constitutional:  Morbidly obese, no acute distress  HENT:  Nares patent without discharge, with septal deviation to the left  Oropharynx without exudate, palate and uvula are very thick and elongated, with very little space posteriorly  Eyes:  Perrla, eomi, no scleral icterus  Neck:  No JVD, no TMG  Cardiovascular:  Normal rate, regular rhythm, no rubs or gallops.  No murmurs        Intact distal pulses  Pulmonary :  Normal breath sounds, no stridor or respiratory distress   No rales, rhonchi, or wheezing  Abdominal:  Soft, nondistended, bowel sounds present.  No tenderness noted.   Musculoskeletal:  2+ lower extremity edema noted.  Lymph Nodes:  No cervical lymphadenopathy noted  Skin:  No cyanosis noted  Neurologic:  Appears sleepy, appropriate, moves all 4 extremities without obvious deficit.         Assessment & Plan:

## 2011-08-16 ENCOUNTER — Other Ambulatory Visit: Payer: Self-pay | Admitting: Cardiology

## 2011-09-06 ENCOUNTER — Ambulatory Visit (HOSPITAL_BASED_OUTPATIENT_CLINIC_OR_DEPARTMENT_OTHER): Payer: Medicaid Other | Attending: Pulmonary Disease | Admitting: General Practice

## 2011-09-06 VITALS — Ht 67.0 in | Wt 270.0 lb

## 2011-09-06 DIAGNOSIS — G4733 Obstructive sleep apnea (adult) (pediatric): Secondary | ICD-10-CM | POA: Insufficient documentation

## 2011-09-10 ENCOUNTER — Other Ambulatory Visit: Payer: Self-pay | Admitting: Cardiology

## 2011-09-12 DIAGNOSIS — I4949 Other premature depolarization: Secondary | ICD-10-CM

## 2011-09-12 DIAGNOSIS — G4733 Obstructive sleep apnea (adult) (pediatric): Secondary | ICD-10-CM

## 2011-09-12 DIAGNOSIS — M62838 Other muscle spasm: Secondary | ICD-10-CM

## 2011-09-13 NOTE — Procedures (Signed)
NAMECARR, Cody [name]                   ACCOUNT NO.:  [id number]  MEDICAL RECORD NO.:  [id number]          PATIENT TYPE:  OUT  LOCATION:  SLEEP CENTER                 FACILITY:  [facility]  PHYSICIAN:  Barbaraann Share, MD,FCCPDATE OF BIRTH:  02/10/1968  DATE OF STUDY:  09/06/2011                           NOCTURNAL POLYSOMNOGRAM  REFERRING PHYSICIAN:  Barbaraann Share, MD,FCCP  REFERRING PHYSICIAN:  Barbaraann Share, MD,FCCP  INDICATION FOR STUDY:  Hypersomnia with sleep apnea.  EPWORTH SLEEPINESS SCORE:  10.  MEDICATIONS:  SLEEP ARCHITECTURE:  The patient had total sleep time of 282 minutes with no slow-wave sleep and only 30 minutes of REM.  Sleep onset latency was normal at 8.5 minutes and REM onset was mildly prolonged at 143 minutes.  Sleep efficiency was mildly reduced at 87%.  RESPIRATORY DATA:  The patient was found to have 58 obstructive apneas and 344 obstructive hypopneas, giving him an apnea-hypopnea index of 86 events per hour.  The events occurred in all body positions and there was very loud snoring noted throughout.  OXYGEN DATA:  There was O2 desaturation as low as 57% with the patient's obstructive events.  CARDIAC DATA:  Rare PVC noted, but no clinically significant arrhythmias were seen.  MOVEMENTS-PARASOMNIA:  The patient had small numbers of leg jerks with no significant sleep disruption.  There is no abnormal behaviors noted.  IMPRESSIONS-RECOMMENDATIONS: 1. Severe obstructive sleep apnea/hypopnea syndrome with an AHI of 86     events per hour, and O2 desaturation as     low as 57%.  Treatment for this degree of sleep apnea should focus     primarily on CPAP, as well as weight loss. 2. Rare PVC noted, but no clinically significant arrhythmias were     seen.     Barbaraann Share, MD,FCCP Diplomate, American Board of Sleep Medicine    [facility]/MEDQ  D:  09/12/2011 17:02:25  T:  09/13/2011 04:36:03  Job:  [id number]

## 2011-09-14 ENCOUNTER — Encounter: Payer: Self-pay | Admitting: Pulmonary Disease

## 2011-09-14 ENCOUNTER — Ambulatory Visit (INDEPENDENT_AMBULATORY_CARE_PROVIDER_SITE_OTHER): Payer: Medicaid Other | Admitting: Pulmonary Disease

## 2011-09-14 VITALS — BP 100/68 | HR 79 | Temp 98.2°F | Ht 67.0 in | Wt 253.8 lb

## 2011-09-14 DIAGNOSIS — G4733 Obstructive sleep apnea (adult) (pediatric): Secondary | ICD-10-CM

## 2011-09-14 NOTE — Assessment & Plan Note (Signed)
The patient was found to have very severe obstructive sleep apnea, and will need to be started on CPAP while working on weight loss.  The patient is agreeable to this approach. I will set the patient up on cpap at a moderate pressure level to allow for desensitization, and will troubleshoot the device over the next 4-6weeks if needed.  The pt is to call me if having issues with tolerance.  Will then optimize the pressure once patient is able to wear cpap on a consistent basis.

## 2011-09-14 NOTE — Progress Notes (Signed)
  Subjective:    Patient ID: Cody [name], male    DOB: [birth year], 46 y.o.   MRN: [id number]  HPI The patient comes in today for followup of his recent sleep study.  He was found to have severe obstructive sleep apnea with an AHI of 86 events per hour and severe desaturation.  I have reviewed the study with him in detail, and answered all of his questions.   Review of Systems  Constitutional: Negative for fever and unexpected weight change.  HENT: Positive for ear pain and tinnitus. Negative for nosebleeds, congestion, sore throat, rhinorrhea, sneezing, trouble swallowing, dental problem, postnasal drip and sinus pressure.   Eyes: Negative for redness and itching.  Respiratory: Positive for cough, shortness of breath and wheezing. Negative for chest tightness.   Cardiovascular: Negative for palpitations and leg swelling.  Gastrointestinal: Negative for nausea and vomiting.  Genitourinary: Negative for dysuria.  Musculoskeletal: Negative for joint swelling.  Skin: Negative for rash.  Neurological: Negative for headaches.  Hematological: Bruises/bleeds easily.  Psychiatric/Behavioral: Negative for dysphoric mood. The patient is not nervous/anxious.        Objective:   Physical Exam Morbidly obese male in no acute distress Nose without purulence or discharge noted Lower extremities with mild edema, no cyanosis Alert and oriented, moves all 4 extremities.       Assessment & Plan:

## 2011-09-14 NOTE — Patient Instructions (Signed)
Will start on cpap.  Please call if having issues with tolerance Work on weight loss followup with me in 5-6 weeks.  

## 2011-09-29 ENCOUNTER — Encounter (HOSPITAL_COMMUNITY): Payer: Self-pay | Admitting: Pharmacy Technician

## 2011-10-05 ENCOUNTER — Encounter (HOSPITAL_COMMUNITY): Payer: Self-pay

## 2011-10-05 ENCOUNTER — Encounter (HOSPITAL_COMMUNITY)
Admission: RE | Admit: 2011-10-05 | Discharge: 2011-10-05 | Disposition: A | Discharge status: Home / Self Care | Payer: Medicaid Other | Source: Ambulatory Visit | Attending: Orthopedic Surgery | Admitting: Orthopedic Surgery

## 2011-10-05 HISTORY — DX: Sleep apnea, unspecified: G47.30

## 2011-10-05 HISTORY — DX: Shortness of breath: R06.02

## 2011-10-05 HISTORY — DX: Attention-deficit hyperactivity disorder, unspecified type: F90.9

## 2011-10-05 HISTORY — DX: Urgency of urination: R39.15

## 2011-10-05 HISTORY — DX: Nocturia: R35.1

## 2011-10-05 HISTORY — DX: Localized edema: R60.0

## 2011-10-05 HISTORY — DX: Anxiety disorder, unspecified: F41.9

## 2011-10-05 HISTORY — DX: Headache: R51

## 2011-10-05 HISTORY — DX: Effusion, unspecified joint: M25.40

## 2011-10-05 HISTORY — DX: Depression, unspecified: F32.A

## 2011-10-05 HISTORY — DX: Other skin changes: R23.8

## 2011-10-05 HISTORY — DX: Major depressive disorder, single episode, unspecified: F32.9

## 2011-10-05 HISTORY — DX: Pain in unspecified joint: M25.50

## 2011-10-05 HISTORY — DX: Edema, unspecified: R60.9

## 2011-10-05 HISTORY — DX: Unspecified osteoarthritis, unspecified site: M19.90

## 2011-10-05 HISTORY — DX: Gastro-esophageal reflux disease without esophagitis: K21.9

## 2011-10-05 HISTORY — DX: Spontaneous ecchymoses: R23.3

## 2011-10-05 HISTORY — DX: Pneumonia, unspecified organism: J18.9

## 2011-10-05 LAB — URINALYSIS, ROUTINE W REFLEX MICROSCOPIC
Ketones, ur: NEGATIVE mg/dL
Leukocytes, UA: NEGATIVE
Nitrite: NEGATIVE
Protein, ur: NEGATIVE mg/dL
Urobilinogen, UA: 1 mg/dL (ref 0.0–1.0)

## 2011-10-05 LAB — TYPE AND SCREEN
ABO/RH(D): B POS
Antibody Screen: NEGATIVE

## 2011-10-05 LAB — CBC
MCH: 30.6 pg (ref 26.0–34.0)
MCV: 91 fL (ref 78.0–100.0)
Platelets: 198 10*3/uL (ref 150–400)
RDW: 15.7 % — ABNORMAL HIGH (ref 11.5–15.5)
WBC: 7.8 10*3/uL (ref 4.0–10.5)

## 2011-10-05 LAB — COMPREHENSIVE METABOLIC PANEL
AST: 22 U/L (ref 0–37)
Albumin: 3.7 g/dL (ref 3.5–5.2)
Calcium: 8.9 mg/dL (ref 8.4–10.5)
Creatinine, Ser: 0.83 mg/dL (ref 0.50–1.35)
Sodium: 136 mEq/L (ref 135–145)

## 2011-10-05 LAB — SURGICAL PCR SCREEN
MRSA, PCR: NEGATIVE
Staphylococcus aureus: NEGATIVE

## 2011-10-05 LAB — PROTIME-INR: INR: 0.97 (ref 0.00–1.49)

## 2011-10-05 LAB — URINE MICROSCOPIC-ADD ON

## 2011-10-05 LAB — ABO/RH: ABO/RH(D): B POS

## 2011-10-05 NOTE — Progress Notes (Signed)
Pt takes Effient daily since 2006-was instructed by Dr.Harwani to stop 7days prior to surgery as well as ASA and Fish Oil

## 2011-10-05 NOTE — Progress Notes (Signed)
Pt states that he has been on Ramipril since 2000 but doesn't have HTN,states he is on it bc of being a diabetic and kidney function

## 2011-10-05 NOTE — Progress Notes (Signed)
Pt states that a year ago he blacked out and Dr.Badger told him that his sugar had dropped but states that Dr.Harwani said he had a seizure but states that he has never had seizures before

## 2011-10-05 NOTE — Progress Notes (Signed)
Sleep study in epic from 3/13 pt uses CPAP

## 2011-10-05 NOTE — Pre-Procedure Instructions (Signed)
20 Cody [name]  10/05/2011   Your procedure is scheduled on:  Wed, May 1 @ 0830 AM  Report to [facility] at 0630 AM.  Call this number if you have problems the morning of surgery: [phone]   Remember:   Do not eat food:After Midnight.  May have clear liquids: up to 4 Hours before arrival.  Clear liquids include soda, tea, black coffee, apple or grape juice, broth,water  Take these medicines the morning of surgery with A SIP OF WATER: Pain Pill(if needed),Celexa,and Xanax   Do not wear jewelry  Do not wear lotions, powders, or perfumes.  Do not bring valuables to the hospital.  Contacts, dentures or bridgework may not be worn into surgery.  Leave suitcase in the car. After surgery it may be brought to your room.  For patients admitted to the hospital, checkout time is 11:00 AM the day of discharge.   Special Instructions: CHG Shower Use Special Wash: 1/2 bottle night before surgery and 1/2 bottle morning of surgery.   Please read over the following fact sheets that you were given: Pain Booklet, Coughing and Deep Breathing, Blood Transfusion Information, Total Joint Packet, MRSA Information and Surgical Site Infection Prevention

## 2011-10-05 NOTE — Progress Notes (Addendum)
Dr.Harwani is cardiologist and clearance note is in chart and to request last office visit  Stress test in epic from 2000 and another one was done in 07/2011- Most recent heart cath was in June 2010-results in epic  Dr.Badger is Medical Md

## 2011-10-06 NOTE — Consult Note (Signed)
Anesthesia Chart Review:  Patient is a 46 year old male scheduled for a right TKR on 10/13/11.  History includes smoking, CAD/anterior MI s/p PTCA/Xience stent to DIAG and proximal LAD '10, HLD, SOB, PNA, GERD, OSA with CPAP use, ADD, depression, head injury '88, headaches, DM2, anxiety.  Dr. Larita Fife is his PCP, and he medically cleared patient for this procedure.  Cardiologist is Dr. Sharyn Lull who provided cardiac clearance for this procedure.  EKG from 10/05/11 showed NSR, cannot rule out anterior infarct (age undetermined).  It was not felt significantly changed from prior.    Nuclear stress test on 07/30/11 showed: Suspect old infarct/scar in the anteroseptal wall. No evidence of ischemia. Ejection fraction 46%.   His last cath was on 12/05/08 and showed: LV showed anterolateral wall hypokinesia with EF of 45-50%  approximately, left main was patent, LAD was patent proximally and then was 100% occluded with diffuse in-stent restenosis, filling faintly by collaterals from left and right system. Diagonal 1 has 30% ostial stenosis with haziness with TIMI grade 3 distal flow. Ramus was very very small. Left circumflex was patent. OM-1 was very very small. OM-  2 was moderate size, which was patent. OM-3 was very small. RCA had 10-15% proximal and 20-25% distal stenosis. PDA and PLV branches were very small. PDA was small, which was patent. PLV branches were very small.  He underwent successful PTCA/stenting DIAG and proximal LAD at that time.  CXR on 05/11/11 showed mild cardiomegaly, but no active disease.  His last sleep study (Dr. Shelle Iron) was done on 09/06/11 and showed severe OSA/hypopnea syndrome with an AHI of 86 events/hr and O2 desaturation as low as 57%.    Labs reviewed.  Anticipate he can proceed if no significant change in his status with careful perioperative cardiopulmonary monitoring due to his CAD and severe OSA.    Shonna Chock, PA-C

## 2011-10-12 MED ORDER — CEFAZOLIN SODIUM-DEXTROSE 2-3 GM-% IV SOLR
2.0000 g | INTRAVENOUS | Status: AC
  Administered 2011-10-13: 2 g via INTRAVENOUS
  Filled 2011-10-12: qty 50

## 2011-10-13 ENCOUNTER — Ambulatory Visit (HOSPITAL_COMMUNITY): Payer: Medicaid Other | Admitting: Vascular Surgery

## 2011-10-13 ENCOUNTER — Inpatient Hospital Stay (HOSPITAL_COMMUNITY): Payer: Medicaid Other

## 2011-10-13 ENCOUNTER — Encounter (HOSPITAL_COMMUNITY): Payer: Self-pay | Admitting: Vascular Surgery

## 2011-10-13 ENCOUNTER — Encounter (HOSPITAL_COMMUNITY)
Admission: RE | Disposition: A | Discharge status: Home Health | Payer: Self-pay | Source: Ambulatory Visit | Attending: Orthopedic Surgery

## 2011-10-13 ENCOUNTER — Inpatient Hospital Stay (HOSPITAL_COMMUNITY)
Admission: RE | Admit: 2011-10-13 | Discharge: 2011-10-15 | DRG: 470 | Disposition: A | Discharge status: Home Health | Payer: Medicaid Other | Source: Ambulatory Visit | Attending: Orthopedic Surgery | Admitting: Orthopedic Surgery

## 2011-10-13 ENCOUNTER — Encounter (HOSPITAL_COMMUNITY): Payer: Self-pay | Admitting: *Deleted

## 2011-10-13 DIAGNOSIS — E119 Type 2 diabetes mellitus without complications: Secondary | ICD-10-CM | POA: Diagnosis present

## 2011-10-13 DIAGNOSIS — Z87820 Personal history of traumatic brain injury: Secondary | ICD-10-CM

## 2011-10-13 DIAGNOSIS — F3289 Other specified depressive episodes: Secondary | ICD-10-CM | POA: Diagnosis present

## 2011-10-13 DIAGNOSIS — Z9861 Coronary angioplasty status: Secondary | ICD-10-CM

## 2011-10-13 DIAGNOSIS — F329 Major depressive disorder, single episode, unspecified: Secondary | ICD-10-CM | POA: Diagnosis present

## 2011-10-13 DIAGNOSIS — K219 Gastro-esophageal reflux disease without esophagitis: Secondary | ICD-10-CM | POA: Diagnosis present

## 2011-10-13 DIAGNOSIS — I2510 Atherosclerotic heart disease of native coronary artery without angina pectoris: Secondary | ICD-10-CM | POA: Diagnosis present

## 2011-10-13 DIAGNOSIS — M1710 Unilateral primary osteoarthritis, unspecified knee: Principal | ICD-10-CM | POA: Diagnosis present

## 2011-10-13 DIAGNOSIS — G4730 Sleep apnea, unspecified: Secondary | ICD-10-CM | POA: Diagnosis present

## 2011-10-13 DIAGNOSIS — E785 Hyperlipidemia, unspecified: Secondary | ICD-10-CM | POA: Diagnosis present

## 2011-10-13 DIAGNOSIS — Z9889 Other specified postprocedural states: Secondary | ICD-10-CM

## 2011-10-13 DIAGNOSIS — F411 Generalized anxiety disorder: Secondary | ICD-10-CM | POA: Diagnosis present

## 2011-10-13 DIAGNOSIS — Z471 Aftercare following joint replacement surgery: Secondary | ICD-10-CM

## 2011-10-13 HISTORY — PX: TOTAL KNEE ARTHROPLASTY: SHX125

## 2011-10-13 LAB — GLUCOSE, CAPILLARY

## 2011-10-13 SURGERY — ARTHROPLASTY, KNEE, TOTAL
Anesthesia: General | Site: Knee | Laterality: Right | Wound class: Clean

## 2011-10-13 MED ORDER — ONDANSETRON HCL 4 MG/2ML IJ SOLN: 4.0000 mg | Freq: Four times a day (QID) | INTRAMUSCULAR | Status: DC | PRN

## 2011-10-13 MED ORDER — ACETAMINOPHEN 325 MG PO TABS: 650.0000 mg | ORAL_TABLET | Freq: Four times a day (QID) | ORAL | Status: DC | PRN

## 2011-10-13 MED ORDER — SENNOSIDES-DOCUSATE SODIUM 8.6-50 MG PO TABS: 1.0000 | ORAL_TABLET | Freq: Every evening | ORAL | Status: DC | PRN

## 2011-10-13 MED ORDER — SUCCINYLCHOLINE CHLORIDE 20 MG/ML IJ SOLN
INTRAMUSCULAR | Status: DC | PRN
  Administered 2011-10-13: 200 mg via INTRAVENOUS

## 2011-10-13 MED ORDER — COUMADIN BOOK
1.0000 | Freq: Once | Status: DC
  Filled 2011-10-13: qty 1

## 2011-10-13 MED ORDER — WARFARIN SODIUM 7.5 MG PO TABS
7.5000 mg | ORAL_TABLET | Freq: Once | ORAL | Status: AC
  Administered 2011-10-13: 7.5 mg via ORAL
  Filled 2011-10-13 (×2): qty 1

## 2011-10-13 MED ORDER — PROPOFOL 10 MG/ML IV EMUL
INTRAVENOUS | Status: DC | PRN
  Administered 2011-10-13: 50 mg via INTRAVENOUS
  Administered 2011-10-13: 150 mg via INTRAVENOUS
  Administered 2011-10-13: 250 mg via INTRAVENOUS

## 2011-10-13 MED ORDER — GLIPIZIDE 10 MG PO TABS
10.0000 mg | ORAL_TABLET | Freq: Two times a day (BID) | ORAL | Status: DC
  Administered 2011-10-14 – 2011-10-15 (×3): 10 mg via ORAL
  Filled 2011-10-13 (×6): qty 1

## 2011-10-13 MED ORDER — PRASUGREL HCL 10 MG PO TABS: 10.0000 mg | ORAL_TABLET | Freq: Every day | ORAL | Status: DC

## 2011-10-13 MED ORDER — CITALOPRAM HYDROBROMIDE 40 MG PO TABS
40.0000 mg | ORAL_TABLET | Freq: Every day | ORAL | Status: DC
  Administered 2011-10-13 – 2011-10-15 (×3): 40 mg via ORAL
  Filled 2011-10-13 (×3): qty 1

## 2011-10-13 MED ORDER — ENOXAPARIN SODIUM 30 MG/0.3ML ~~LOC~~ SOLN
30.0000 mg | Freq: Two times a day (BID) | SUBCUTANEOUS | Status: DC
  Administered 2011-10-14 – 2011-10-15 (×3): 30 mg via SUBCUTANEOUS
  Filled 2011-10-13 (×5): qty 0.3

## 2011-10-13 MED ORDER — BUPIVACAINE HCL (PF) 0.25 % IJ SOLN
INTRAMUSCULAR | Status: DC | PRN
  Administered 2011-10-13: 30 mL

## 2011-10-13 MED ORDER — CEFAZOLIN SODIUM 1-5 GM-% IV SOLN
1.0000 g | Freq: Three times a day (TID) | INTRAVENOUS | Status: AC
  Administered 2011-10-13 – 2011-10-14 (×3): 1 g via INTRAVENOUS
  Filled 2011-10-13 (×3): qty 50

## 2011-10-13 MED ORDER — HYDROMORPHONE HCL PF 1 MG/ML IJ SOLN
INTRAMUSCULAR | Status: AC
  Filled 2011-10-13: qty 1

## 2011-10-13 MED ORDER — RAMIPRIL 10 MG PO CAPS
10.0000 mg | ORAL_CAPSULE | Freq: Every day | ORAL | Status: DC
  Administered 2011-10-13 – 2011-10-15 (×3): 10 mg via ORAL
  Filled 2011-10-13 (×3): qty 1

## 2011-10-13 MED ORDER — DOCUSATE SODIUM 100 MG PO CAPS
100.0000 mg | ORAL_CAPSULE | Freq: Two times a day (BID) | ORAL | Status: DC
  Administered 2011-10-13 – 2011-10-15 (×4): 100 mg via ORAL
  Filled 2011-10-13 (×6): qty 1

## 2011-10-13 MED ORDER — ATORVASTATIN CALCIUM 40 MG PO TABS
40.0000 mg | ORAL_TABLET | Freq: Every day | ORAL | Status: DC
  Administered 2011-10-13 – 2011-10-14 (×2): 40 mg via ORAL
  Filled 2011-10-13 (×3): qty 1

## 2011-10-13 MED ORDER — PHENOL 1.4 % MT LIQD: 1.0000 | OROMUCOSAL | Status: DC | PRN

## 2011-10-13 MED ORDER — MAGNESIUM CITRATE PO SOLN: 1.0000 | Freq: Once | ORAL | Status: AC | PRN

## 2011-10-13 MED ORDER — METHOCARBAMOL 500 MG PO TABS
500.0000 mg | ORAL_TABLET | Freq: Four times a day (QID) | ORAL | Status: DC | PRN
  Administered 2011-10-13 – 2011-10-15 (×4): 500 mg via ORAL
  Filled 2011-10-13 (×4): qty 1

## 2011-10-13 MED ORDER — HYDROMORPHONE HCL PF 1 MG/ML IJ SOLN: 0.2500 mg | INTRAMUSCULAR | Status: DC | PRN

## 2011-10-13 MED ORDER — FUROSEMIDE 80 MG PO TABS
80.0000 mg | ORAL_TABLET | Freq: Every day | ORAL | Status: DC
  Administered 2011-10-13 – 2011-10-15 (×3): 80 mg via ORAL
  Filled 2011-10-13 (×3): qty 1

## 2011-10-13 MED ORDER — MORPHINE SULFATE 4 MG/ML IJ SOLN
INTRAMUSCULAR | Status: DC | PRN
  Administered 2011-10-13: 4 mg via INTRAVENOUS

## 2011-10-13 MED ORDER — LINAGLIPTIN 5 MG PO TABS
5.0000 mg | ORAL_TABLET | Freq: Every day | ORAL | Status: DC
  Administered 2011-10-13 – 2011-10-15 (×3): 5 mg via ORAL
  Filled 2011-10-13 (×3): qty 1

## 2011-10-13 MED ORDER — METHOCARBAMOL 100 MG/ML IJ SOLN: 500.0000 mg | Freq: Four times a day (QID) | INTRAVENOUS | Status: DC | PRN

## 2011-10-13 MED ORDER — AMPHETAMINE-DEXTROAMPHET ER 25 MG PO CP24: 25.0000 mg | ORAL_CAPSULE | ORAL | Status: DC

## 2011-10-13 MED ORDER — HYDROMORPHONE HCL PF 1 MG/ML IJ SOLN
0.5000 mg | INTRAMUSCULAR | Status: DC | PRN
  Administered 2011-10-13 (×2): 1 mg via INTRAVENOUS
  Filled 2011-10-13: qty 1

## 2011-10-13 MED ORDER — OXYCODONE-ACETAMINOPHEN 5-325 MG PO TABS
1.0000 | ORAL_TABLET | ORAL | Status: DC | PRN
  Administered 2011-10-14 – 2011-10-15 (×6): 2 via ORAL
  Filled 2011-10-13 (×7): qty 2

## 2011-10-13 MED ORDER — AMPHETAMINE-DEXTROAMPHET ER 5 MG PO CP24
5.0000 mg | ORAL_CAPSULE | Freq: Every day | ORAL | Status: DC
  Filled 2011-10-13: qty 1

## 2011-10-13 MED ORDER — FENTANYL CITRATE 0.05 MG/ML IJ SOLN
INTRAMUSCULAR | Status: DC | PRN
  Administered 2011-10-13 (×4): 50 ug via INTRAVENOUS

## 2011-10-13 MED ORDER — AMPHETAMINE-DEXTROAMPHET ER 20 MG PO CP24
20.0000 mg | ORAL_CAPSULE | Freq: Every day | ORAL | Status: DC
  Filled 2011-10-13: qty 2

## 2011-10-13 MED ORDER — METFORMIN HCL 500 MG PO TABS
1000.0000 mg | ORAL_TABLET | Freq: Two times a day (BID) | ORAL | Status: DC
  Administered 2011-10-14 – 2011-10-15 (×3): 1000 mg via ORAL
  Filled 2011-10-13 (×6): qty 2

## 2011-10-13 MED ORDER — ONDANSETRON HCL 4 MG PO TABS: 4.0000 mg | ORAL_TABLET | Freq: Four times a day (QID) | ORAL | Status: DC | PRN

## 2011-10-13 MED ORDER — POTASSIUM CHLORIDE IN NACL 20-0.9 MEQ/L-% IV SOLN
INTRAVENOUS | Status: DC
  Administered 2011-10-13: 18:00:00 via INTRAVENOUS
  Administered 2011-10-14: 90 mL via INTRAVENOUS
  Administered 2011-10-14: 13:00:00 via INTRAVENOUS
  Administered 2011-10-14: 90 mL/h via INTRAVENOUS
  Filled 2011-10-13 (×7): qty 1000

## 2011-10-13 MED ORDER — LACTATED RINGERS IV SOLN
INTRAVENOUS | Status: DC | PRN
  Administered 2011-10-13: 08:00:00 via INTRAVENOUS

## 2011-10-13 MED ORDER — INSULIN ASPART 100 UNIT/ML ~~LOC~~ SOLN
0.0000 [IU] | Freq: Three times a day (TID) | SUBCUTANEOUS | Status: DC
  Administered 2011-10-13 – 2011-10-14 (×4): 7 [IU] via SUBCUTANEOUS
  Administered 2011-10-15: 4 [IU] via SUBCUTANEOUS
  Administered 2011-10-15: 3 [IU] via SUBCUTANEOUS

## 2011-10-13 MED ORDER — PANTOPRAZOLE SODIUM 40 MG PO TBEC
80.0000 mg | DELAYED_RELEASE_TABLET | Freq: Every day | ORAL | Status: DC
  Administered 2011-10-13 – 2011-10-14 (×2): 80 mg via ORAL
  Filled 2011-10-13: qty 1
  Filled 2011-10-13: qty 2

## 2011-10-13 MED ORDER — WARFARIN - PHARMACIST DOSING INPATIENT: Freq: Every day | Status: DC

## 2011-10-13 MED ORDER — ACETAMINOPHEN 650 MG RE SUPP: 650.0000 mg | Freq: Four times a day (QID) | RECTAL | Status: DC | PRN

## 2011-10-13 MED ORDER — DROPERIDOL 2.5 MG/ML IJ SOLN: 0.6250 mg | INTRAMUSCULAR | Status: DC | PRN

## 2011-10-13 MED ORDER — WARFARIN VIDEO: 1.0000 | Freq: Once | Status: DC

## 2011-10-13 MED ORDER — BUPIVACAINE-EPINEPHRINE PF 0.5-1:200000 % IJ SOLN
INTRAMUSCULAR | Status: DC | PRN
  Administered 2011-10-13: 150 mg

## 2011-10-13 MED ORDER — MIDAZOLAM HCL 5 MG/5ML IJ SOLN
INTRAMUSCULAR | Status: DC | PRN
  Administered 2011-10-13: 2 mg via INTRAVENOUS

## 2011-10-13 MED ORDER — MENTHOL 3 MG MT LOZG: 1.0000 | LOZENGE | OROMUCOSAL | Status: DC | PRN

## 2011-10-13 MED ORDER — ALPRAZOLAM 0.5 MG PO TABS
0.5000 mg | ORAL_TABLET | Freq: Two times a day (BID) | ORAL | Status: DC
  Administered 2011-10-13 – 2011-10-15 (×4): 0.5 mg via ORAL
  Filled 2011-10-13 (×4): qty 1

## 2011-10-13 MED ORDER — ONDANSETRON HCL 4 MG/2ML IJ SOLN
INTRAMUSCULAR | Status: DC | PRN
  Administered 2011-10-13: 4 mg via INTRAVENOUS

## 2011-10-13 MED ORDER — SODIUM CHLORIDE 0.9 % IR SOLN
Status: DC | PRN
  Administered 2011-10-13: 3000 mL
  Administered 2011-10-13: 1000 mL

## 2011-10-13 SURGICAL SUPPLY — 59 items
BANDAGE ESMARK 6X9 LF (GAUZE/BANDAGES/DRESSINGS) ×1 IMPLANT
BLADE SAG 18X100X1.27 (BLADE) ×4 IMPLANT
BNDG CMPR 9X6 STRL LF SNTH (GAUZE/BANDAGES/DRESSINGS) ×1
BNDG ESMARK 6X9 LF (GAUZE/BANDAGES/DRESSINGS) ×2
BOOTCOVER CLEANROOM LRG (PROTECTIVE WEAR) ×4 IMPLANT
BOWL SMART MIX CTS (DISPOSABLE) ×2 IMPLANT
CEMENT BONE SIMPLEX SPEEDSET (Cement) ×4 IMPLANT
CLOTH BEACON ORANGE TIMEOUT ST (SAFETY) ×2 IMPLANT
COVER BACK TABLE 24X17X13 BIG (DRAPES) ×1 IMPLANT
COVER SURGICAL LIGHT HANDLE (MISCELLANEOUS) ×2 IMPLANT
CUFF TOURNIQUET SINGLE 34IN LL (TOURNIQUET CUFF) ×2 IMPLANT
DRAPE EXTREMITY T 121X128X90 (DRAPE) ×2 IMPLANT
DRAPE PROXIMA HALF (DRAPES) ×2 IMPLANT
DRAPE U-SHAPE 47X51 STRL (DRAPES) ×2 IMPLANT
DRSG PAD ABDOMINAL 8X10 ST (GAUZE/BANDAGES/DRESSINGS) ×3 IMPLANT
DURAPREP 26ML APPLICATOR (WOUND CARE) ×2 IMPLANT
ELECT CAUTERY BLADE 6.4 (BLADE) ×2 IMPLANT
ELECT REM PT RETURN 9FT ADLT (ELECTROSURGICAL) ×2
ELECTRODE REM PT RTRN 9FT ADLT (ELECTROSURGICAL) ×1 IMPLANT
EVACUATOR 1/8 PVC DRAIN (DRAIN) ×2 IMPLANT
FACESHIELD LNG OPTICON STERILE (SAFETY) ×2 IMPLANT
GAUZE XEROFORM 5X9 LF (GAUZE/BANDAGES/DRESSINGS) ×2 IMPLANT
GLOVE BIOGEL PI IND STRL 8 (GLOVE) ×1 IMPLANT
GLOVE BIOGEL PI INDICATOR 8 (GLOVE) ×1
GLOVE ORTHO TXT STRL SZ7.5 (GLOVE) ×2 IMPLANT
GOWN PREVENTION PLUS XLARGE (GOWN DISPOSABLE) ×4 IMPLANT
GOWN STRL NON-REIN LRG LVL3 (GOWN DISPOSABLE) ×4 IMPLANT
GOWN STRL REIN 2XL XLG LVL4 (GOWN DISPOSABLE) ×2 IMPLANT
HANDPIECE INTERPULSE COAX TIP (DISPOSABLE) ×2
IMMOBILIZER KNEE 22 UNIV (SOFTGOODS) ×2 IMPLANT
IMMOBILIZER KNEE 24 THIGH 36 (MISCELLANEOUS) IMPLANT
IMMOBILIZER KNEE 24 UNIV (MISCELLANEOUS)
KIT BASIN OR (CUSTOM PROCEDURE TRAY) ×2 IMPLANT
KIT ROOM TURNOVER OR (KITS) ×2 IMPLANT
MANIFOLD NEPTUNE II (INSTRUMENTS) ×2 IMPLANT
NS IRRIG 1000ML POUR BTL (IV SOLUTION) ×2 IMPLANT
PACK TOTAL JOINT (CUSTOM PROCEDURE TRAY) ×2 IMPLANT
PAD ARMBOARD 7.5X6 YLW CONV (MISCELLANEOUS) ×4 IMPLANT
PAD CAST 4YDX4 CTTN HI CHSV (CAST SUPPLIES) ×1 IMPLANT
PADDING CAST COTTON 4X4 STRL (CAST SUPPLIES) ×2
PADDING CAST COTTON 6X4 STRL (CAST SUPPLIES) ×2 IMPLANT
RUBBERBAND STERILE (MISCELLANEOUS) ×2 IMPLANT
SET HNDPC FAN SPRY TIP SCT (DISPOSABLE) ×1 IMPLANT
SLEEVE SURGEON STRL (DRAPES) ×1 IMPLANT
SPONGE GAUZE 4X4 12PLY (GAUZE/BANDAGES/DRESSINGS) ×2 IMPLANT
STAPLER VISISTAT 35W (STAPLE) ×2 IMPLANT
SUCTION FRAZIER TIP 10 FR DISP (SUCTIONS) ×2 IMPLANT
SUT VIC AB 0 CT1 27 (SUTURE) ×4
SUT VIC AB 0 CT1 27XBRD ANBCTR (SUTURE) IMPLANT
SUT VIC AB 1 CTX 36 (SUTURE) ×4
SUT VIC AB 1 CTX36XBRD ANBCTR (SUTURE) ×2 IMPLANT
SUT VIC AB 2-0 CT1 27 (SUTURE) ×4
SUT VIC AB 2-0 CT1 TAPERPNT 27 (SUTURE) ×2 IMPLANT
SYR 30ML LL (SYRINGE) ×2 IMPLANT
SYR 30ML SLIP (SYRINGE) ×2 IMPLANT
TOWEL OR 17X24 6PK STRL BLUE (TOWEL DISPOSABLE) ×2 IMPLANT
TOWEL OR 17X26 10 PK STRL BLUE (TOWEL DISPOSABLE) ×2 IMPLANT
TRAY FOLEY CATH 14FR (SET/KITS/TRAYS/PACK) ×2 IMPLANT
WATER STERILE IRR 1000ML POUR (IV SOLUTION) ×4 IMPLANT

## 2011-10-13 NOTE — Progress Notes (Signed)
Orthopedic Tech Progress Note Patient Details:  Cody [name] May 22, 1966 [id number]  Other Ortho Devices Type of Ortho Device: Knee Immobilizer   Cammer, Mickie Bail 10/13/2011, 3:57 PM

## 2011-10-13 NOTE — Anesthesia Procedure Notes (Signed)
Anesthesia Regional Block:  Femoral nerve block  Pre-Anesthetic Checklist: ,, timeout performed, Correct Patient, Correct Site, Correct Laterality, Correct Procedure,, site marked, risks and benefits discussed, Surgical consent,  Pre-op evaluation,  At surgeon's request and post-op pain management   Prep: chloraprep       Needles:  Injection technique: Single-shot  Needle Type: Echogenic Stimulator Needle     Needle Length: 5cm 5 cm Needle Gauge: 22 and 22 G    Additional Needles:  Procedures: ultrasound guided and nerve stimulator Femoral nerve block  Nerve Stimulator or Paresthesia:  Response: quadraceps contraction, 0.45 mA,   Additional Responses:   Narrative:  Start time: 10/13/2011 7:57 AM End time: 10/13/2011 8:08 AM Injection made incrementally with aspirations every 5 mL.  Performed by: Personally  Anesthesiologist: Halford Decamp, MD  Additional Notes: Functioning IV was confirmed and monitors were applied.  A 50mm 22ga Arrow echogenic stimulator needle was used. Sterile prep and drape,hand hygiene and sterile gloves were used. Ultrasound guidance: relevant anatomy identified, needle position confirmed, local anesthetic spread visualized around nerve(s)., vascular puncture avoided.  Image printed for medical record. Negative aspiration and negative test dose prior to incremental administration of local anesthetic. The patient tolerated the procedure well.    Femoral nerve block

## 2011-10-13 NOTE — H&P (Signed)
  Full H&P to be scanned into Epic. 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 Patient seen and examined. Lungs clear, Heart RRR. Proceeding with Right Total Knee Replacement. Conservative options exhausted. Procedure, risks, benefits, complication reviewed. All question answered.

## 2011-10-13 NOTE — Transfer of Care (Signed)
Immediate Anesthesia Transfer of Care Note  Patient: Cody [name]  Procedure(s) Performed: Procedure(s) (LRB): TOTAL KNEE ARTHROPLASTY (Right)  Patient Location: PACU  Anesthesia Type: General  Level of Consciousness: sedated, patient cooperative and responds to stimulaton  Airway & Oxygen Therapy: Patient Spontanous Breathing and Patient connected to face mask oxgen  Post-op Assessment: Report given to PACU RN and Post -op Vital signs reviewed and stable  Post vital signs: Reviewed and stable  Complications: No apparent anesthesia complications

## 2011-10-13 NOTE — Consult Note (Signed)
ANTICOAGULATION CONSULT NOTE - Initial Consult  Pharmacy Consult for : Coumadin with Lovenox bridging Indication: VTE prophylaxis  No Known Allergies  Patient Measurements:    Vital Signs: Temp: 97.9 F (36.6 C) (05/01 1250) Temp src: Oral (05/01 0614) BP: 119/78 mmHg (05/01 1250) Pulse Rate: 67  (05/01 1250)  Labs: Lab Results  Component Value Date   INR 0.97 10/05/2011   INR 0.9 HEMOLYZED SPECIMEN, RESULTS MAY BE AFFECTED 12/02/2008   Lab Results  Component Value Date   HGB 14.9 10/05/2011   Lab Results  Component Value Date   PLT 198 10/05/2011   Estimated Creatinine Clearance: 147.4 ml/min (by C-G formula based on Cr of 0.83).  Medical / Surgical History: Past Medical History  Diagnosis Date  . Hyperlipidemia     takes Lipitor nightly  . 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 Stented coronary artery 2000, 2010  . Heart attack 2000  . Coronary artery disease   . Sleep apnea     uses CPAP nightly  . Shortness of breath     with exertion  . Peripheral edema     takes Furosemide daily  . Pneumonia     hx of >60yrs ago  . Headache   . Arthritis   . Joint pain   . Joint swelling   . Bruises easily     takes Effient daily  . GERD (gastroesophageal reflux disease)     takes Prilosec prn  . Urinary urgency   . Nocturia   . Diabetes mellitus     takes Januvia,Metformin,and Glipizide daily  . ADD (attention deficit disorder with hyperactivity)   . Anxiety     takes Xanax bid  . Depression     takes Celexa daily  . ADD (attention deficit disorder with hyperactivity)     takes Adderall daily   Past Surgical History  Procedure Date  . Knee surgery 24 yrs. ago    right  . Application of wound vac 11/20/2010    and suture of bleeding post-op wound right buttock  . Rectal surgery 11/20/2010    exc. of infection perianal area right buttock (I & D)  . Ankle arthroscopy 05/13/2011    Procedure: ANKLE ARTHROSCOPY;  Surgeon: Loreta Ave,  MD;  [facility]  [facility];  Service: Orthopedics;  Laterality: Right;  right ankle arthroscopy with extensive debridement synovectomy and chondroplasty  . Cardiac catheterization 12/05/2008    and in 2000  . Coronary angioplasty     2 stents  . Colonoscopy     Medications:  Prescriptions prior to admission  Medication Sig Dispense Refill  . ALPRAZolam (XANAX) 0.5 MG tablet Take 0.5 mg by mouth 2 (two) times daily. Anxiety      . amphetamine-dextroamphetamine (ADDERALL XR) 25 MG 24 hr capsule Take 25 mg by mouth every morning.       Marland Kitchen aspirin 81 MG tablet Take 81 mg by mouth daily.       Marland Kitchen atorvastatin (LIPITOR) 40 MG tablet Take 40 mg by mouth daily.      . citalopram (CELEXA) 40 MG tablet Take 40 mg by mouth daily. PM      . fish oil-omega-3 fatty acids 1000 MG capsule Take 2 g by mouth daily.       . furosemide (LASIX) 80 MG tablet Take 80 mg by mouth daily.       Marland Kitchen glipiZIDE (GLUCOTROL) 10 MG tablet Take 10 mg  by mouth 2 (two) times daily before a meal.       . metFORMIN (GLUCOPHAGE) 1000 MG tablet Take 1,000 mg by mouth 2 (two) times daily with a meal.       . omeprazole (PRILOSEC) 40 MG capsule Take 40 mg by mouth daily.      Marland Kitchen oxyCODONE-acetaminophen (PERCOCET) 5-325 MG per tablet Take 1 tablet by mouth every 12 (twelve) hours as needed. Pain      . potassium chloride SA (K-DUR,KLOR-CON) 20 MEQ tablet Take 20 mEq by mouth daily.      . prasugrel (EFFIENT) 10 MG TABS Take 10 mg by mouth daily.       . ramipril (ALTACE) 10 MG capsule Take 10 mg by mouth daily.       . sitaGLIPtin (JANUVIA) 100 MG tablet Take 100 mg by mouth every morning.        Scheduled:    . ALPRAZolam  0.5 mg Oral BID  . amphetamine-dextroamphetamine  5 mg Oral Daily   And  . amphetamine-dextroamphetamine  20 mg Oral Daily  . atorvastatin  40 mg Oral q1800  .  ceFAZolin (ANCEF) IV  1 g Intravenous Q8H  .  ceFAZolin (ANCEF) IV  2 g Intravenous 60 min Pre-Op  . citalopram  40 mg Oral Daily  .  docusate sodium  100 mg Oral BID  . enoxaparin  30 mg Subcutaneous Q12H  . furosemide  80 mg Oral Daily  . glipiZIDE  10 mg Oral BID AC  . HYDROmorphone      . insulin aspart  0-20 Units Subcutaneous TID WC  . linagliptin  5 mg Oral Daily  . metFORMIN  1,000 mg Oral BID WC  . pantoprazole  80 mg Oral Q1200  . ramipril  10 mg Oral Daily  . DISCONTD: amphetamine-dextroamphetamine  25 mg Oral BH-q7a  . DISCONTD: prasugrel  10 mg Oral Daily   Anti-infectives     Start     Dose/Rate Route Frequency Ordered Stop   10/13/11 1700   ceFAZolin (ANCEF) IVPB 1 g/50 mL premix        1 g 100 mL/hr over 30 Minutes Intravenous 3 times per day 10/13/11 1302 10/14/11 1359   10/12/11 1415   ceFAZolin (ANCEF) IVPB 2 g/50 mL premix        2 g 100 mL/hr over 30 Minutes Intravenous 60 min pre-op 10/12/11 1415 10/13/11 0848          Assessment:  Patient is a 46 y/o white male s/p R-TKA now placed on Coumadin with Lovenox bridging for VTE prophylaxis.  Patient has a history of a cardiac stent and chronic Effient since 2010.    Patient also with significant history of Head Injury (not defined in PMH). in which the patient was in a coma for 8 days.    Baseline INR  0.97,  Hgb  14.9,  Plt  198.  Coumadin predictor score = 8  Due to past medical and cardiac history, will begin with a more conservative dose and adjust as required.  Goal of Therapy:   INR 2-3   Plan:   Coumadin 7.5 mg x 1. Daily INR's, CBC. Warf ED initiated  Laurena Bering, Pharm.D. 10/13/2011 4:08 PM

## 2011-10-13 NOTE — Anesthesia Preprocedure Evaluation (Addendum)
Anesthesia Evaluation  Patient identified by MRN, date of birth, ID band Patient awake    Reviewed: Allergy & Precautions, H&P , NPO status , Patient's Chart, lab work & pertinent test results, reviewed documented beta blocker date and time   History of Anesthesia Complications Negative for: history of anesthetic complications  Airway Mallampati: III TM Distance: >3 FB Neck ROM: Full    Dental  (+) Teeth Intact and Dental Advisory Given   Pulmonary sleep apnea and Continuous Positive Airway Pressure Ventilation ,  breath sounds clear to auscultation  Pulmonary exam normal       Cardiovascular hypertension, Pt. on medications + CAD and + Past MI Rhythm:Regular Rate:Normal     Neuro/Psych Anxiety Depression    GI/Hepatic Neg liver ROS, GERD-  ,  Endo/Other  Diabetes mellitus-, Oral Hypoglycemic Agents  Renal/GU negative Renal ROS     Musculoskeletal   Abdominal   Peds  Hematology   Anesthesia Other Findings   Reproductive/Obstetrics                           Anesthesia Physical Anesthesia Plan  ASA: III  Anesthesia Plan: General   Post-op Pain Management:    Induction: Intravenous  Airway Management Planned: LMA  Additional Equipment:   Intra-op Plan:   Post-operative Plan: Extubation in OR  Informed Consent: I have reviewed the patients History and Physical, chart, labs and discussed the procedure including the risks, benefits and alternatives for the proposed anesthesia with the patient or authorized representative who has indicated his/her understanding and acceptance.   Dental advisory given  Plan Discussed with: CRNA and Surgeon  Anesthesia Plan Comments:         Anesthesia Quick Evaluation

## 2011-10-13 NOTE — Progress Notes (Signed)
Orthopedic Tech Progress Note Patient Details:  NAS WAFER [birth year] [id number]  CPM Right Knee CPM Right Knee: On Right Knee Flexion (Degrees): 60  Right Knee Extension (Degrees): 0    Magnus Crescenzo T 10/13/2011, 11:25 AM

## 2011-10-13 NOTE — Brief Op Note (Signed)
10/13/2011  10:33 AM  PATIENT:  Molli Hazard  46 y.o. male  PRE-OPERATIVE DIAGNOSIS: right knee djd  POST-OPERATIVE DIAGNOSIS:  same  PROCEDURE:  Procedure(s) (LRB): TOTAL KNEE ARTHROPLASTY (Right) and tibia screw removal  SURGEON:  Surgeon(s) and Role:    * Loreta Ave, MD - Primary  PHYSICIAN ASSISTANT: Zonia Kief M    ANESTHESIA:   general  EBL:  Total I/O In: 1000 [I.V.:1000] Out: 450 [Urine:450]  BLOOD ADMINISTERED:none   SPECIMEN:  No Specimen  DISPOSITION OF SPECIMEN:  N/A  COUNTS:  YES  TOURNIQUET:   Total Tourniquet Time Documented: Thigh (Right) - 79 minutes   PATIENT DISPOSITION:  PACU - hemodynamically stable.

## 2011-10-13 NOTE — Anesthesia Postprocedure Evaluation (Signed)
  Anesthesia Post-op Note  Patient: Cody [name]  Procedure(s) Performed: Procedure(s) (LRB): TOTAL KNEE ARTHROPLASTY (Right)  Patient Location: PACU  Anesthesia Type: General  Level of Consciousness: awake, alert  and oriented  Airway and Oxygen Therapy: Patient Spontanous Breathing  Post-op Pain: mild  Post-op Assessment: Post-op Vital signs reviewed, Patient's Cardiovascular Status Stable, Respiratory Function Stable, Patent Airway, No signs of Nausea or vomiting and Pain level controlled  Post-op Vital Signs: Reviewed and stable  Complications: No apparent anesthesia complications

## 2011-10-14 ENCOUNTER — Encounter (HOSPITAL_COMMUNITY): Payer: Self-pay | Admitting: Orthopedic Surgery

## 2011-10-14 LAB — CBC
HCT: 39.9 % (ref 39.0–52.0)
Hemoglobin: 13.2 g/dL (ref 13.0–17.0)
MCV: 90.5 fL (ref 78.0–100.0)
RDW: 15.8 % — ABNORMAL HIGH (ref 11.5–15.5)
WBC: 8 10*3/uL (ref 4.0–10.5)

## 2011-10-14 LAB — BASIC METABOLIC PANEL
BUN: 10 mg/dL (ref 6–23)
CO2: 26 mEq/L (ref 19–32)
Chloride: 99 mEq/L (ref 96–112)
Creatinine, Ser: 0.69 mg/dL (ref 0.50–1.35)
GFR calc Af Amer: >90 mL/min (ref >90)
Glucose, Bld: 222 mg/dL — ABNORMAL HIGH (ref 70–99)

## 2011-10-14 LAB — GLUCOSE, CAPILLARY: Glucose-Capillary: 192 mg/dL — ABNORMAL HIGH (ref 70–99)

## 2011-10-14 MED ORDER — AMPHETAMINE-DEXTROAMPHET ER 5 MG PO CP24
25.0000 mg | ORAL_CAPSULE | Freq: Every day | ORAL | Status: DC
  Administered 2011-10-14: 25 mg via ORAL
  Administered 2011-10-15: 11:00:00 via ORAL
  Administered 2011-10-15: 25 mg via ORAL
  Filled 2011-10-14: qty 2

## 2011-10-14 MED ORDER — WARFARIN SODIUM 7.5 MG PO TABS
7.5000 mg | ORAL_TABLET | Freq: Once | ORAL | Status: AC
  Administered 2011-10-14: 7.5 mg via ORAL
  Filled 2011-10-14: qty 1

## 2011-10-14 NOTE — Progress Notes (Signed)
Subjective: Doing well.  Pain controlled.     Objective: Vital signs in last 24 hours: Temp:  [96.8 F (36 C)-99.7 F (37.6 C)] 98.2 F (36.8 C) (05/02 0634) Pulse Rate:  [67-95] 95  (05/02 0634) Resp:  [15-22] 20  (05/02 0634) BP: (97-133)/(64-78) 125/78 mmHg (05/02 0634) SpO2:  [94 %-100 %] 97 % (05/02 0634) Weight:  [124.286 kg (274 lb)] 124.286 kg (274 lb) (05/01 1609)  Intake/Output from previous day: 05/01 0701 - 05/02 0700 In: 3950 [I.V.:1200] Out: 2500 [Urine:2300; Drains:200] Intake/Output this shift:     Basename 10/14/11 0620  HGB 13.2    Basename 10/14/11 0620  WBC 8.0  RBC 4.41  HCT 39.9  PLT 169    Basename 10/14/11 0620  NA 136  K 4.3  CL 99  CO2 26  BUN 10  CREATININE 0.69  GLUCOSE 222*  CALCIUM 8.7    Basename 10/14/11 0620  LABPT --  INR 1.08   Exam:   Bleeding thru dressing.  Reinforced.  Calf nt, nvi. Assessment/Plan: Anticipate d/c home fri or sat.  D/c dilaudid and foley.     Everard Interrante M 10/14/2011, 9:48 AM

## 2011-10-14 NOTE — Op Note (Signed)
Cody [name], Cody [name] NO.:  [id number]  MEDICAL RECORD NO.:  [id number]  LOCATION:  5034                         FACILITY:  [facility]  PHYSICIAN:  Loreta Ave, M.D. DATE OF BIRTH:  10-04-1965  DATE OF PROCEDURE:  10/13/2011 DATE OF DISCHARGE:                              OPERATIVE REPORT   PREOPERATIVE DIAGNOSES: 1. Right knee end-stage degenerative arthritis, valgus alignment. 2. Previous anterior cruciate ligament reconstruction with retained     metallic screws, tibia and femur.  POSTOPERATIVE DIAGNOSES: 1. Right knee end-stage degenerative arthritis, valgus alignment. 2. Previous anterior cruciate ligament reconstruction with retained     metallic screws, tibia and femur.  PROCEDURES:  Right knee modified minimally invasive total knee replacement with Stryker triathlon prosthesis.  Soft tissue balancing. Cemented pegged posterior stabilized #6 femoral component.  Cemented #6 tibial component, 9-mm insert.  Cemented resurfacing 38-mm patellar component.  Removal of interference screw from proximal tibia.  Femoral screw was not in the way of the prosthesis, left in place.  SURGEON:  Loreta Ave, MD  ASSISTANT:  Zonia Kief, PA, present throughout the entire case and necessary for timely completion of the procedure.  ANESTHESIA:  General.  BLOOD LOSS:  Minimal.  SPECIMENS:  None.  COMPLICATION:  None.  DRESSINGS:  Soft compressive knee immobilizer.  DRAINS:  Hemovac x1.  TOURNIQUET TIME:  1 hour 10 minutes.  PROCEDURE:  The patient was brought to the operating room, placed on the operating table in supine position.  After adequate anesthesia had been obtained, knee was examined.  Increased valgus of 5 degrees was only partially correctable.  Fairly good flexion and extension.  Stable ligaments.  Tourniquet applied.  Prepped and draped in usual sterile fashion.  Exsanguinated with elevation and Esmarch.  Tourniquet was inflated to 350  mmHg.  A straight incision above the patella down to the tibial tubercle incorporating a part of an old incision from his ACL. Skin and subcutaneous tissues were divided.  Medial arthrotomy, vastus splitting preserving quad tendon.  Hemostasis with cautery.  Medial capsule release.  Dissection was carried down over the front of the tibia uncovering the partially buried interference screw, which was exposed and then removed.  Knee was exposed.  Periarticular spurs, remnants of cruciate ligaments, menisci spurs all debrided. Intramedullary guide in distal fibula.  A 5 degrees of valgus.  An 8 mm resection.  Using epicondylar axis, the femur was sized, cut, and fitted for a posterior stabilized #6 component.  Proximal tibial resection, 3 degree posterior slope cut.  Debris was cleared throughout in flexion and extension.  Patella was exposed, posterior 10 mm was removed. Drilled, sized, and fitted for a 38-mm component.  Copious irrigation. Trial was put in place.  Tibia was marked for rotation utilizing trials. Full extension with full flexion, nice biomechanical axis, good balancing in flexion and extension, and good patellofemoral tracking after I did a limited internal lateral release just freeing up scar tissue from previous surgery.  All trials were removed.  The tibia was punched for the definitive component.  Copious irrigation with a pulse irrigating device.  Cement was prepared, placed on all components, firmly  seated.  Patella was held with clamp.  Once cement hardened, the knee was reexamined.  Again, pleased with balancing, mechanical axis, tracking, and stability.  Hemovac was placed.  Arthrotomy was closed with #1 Vicryl, skin and subcutaneous tissue with Vicryl and staples. Sterile compressive dressing was applied.  Tourniquet was deflated and removed.  Knee immobilizer was applied.  Anesthesia reversed.  Brought to the recovery room.  He tolerated the surgery well.  No  complications.     Loreta Ave, M.D.     DFM/MEDQ  D:  10/14/2011  T:  10/14/2011  Job:  [id number]

## 2011-10-14 NOTE — Progress Notes (Signed)
10/14/11 1155  PT Visit Information  Last PT Received On 10/14/11  Assistance Needed +1  PT Time Calculation  PT Start Time 1115  PT Stop Time 1150  PT Time Calculation (min) 35 min  Subjective Data  Subjective Fayrene Fearing told me I needed to do this  Patient Stated Goal Home tomorrow--Independent  Precautions  Precautions Knee  Required Braces or Orthoses Knee Immobilizer - Right  Home Living  Lives With Spouse;Daughter  Available Help at Discharge Family  Type of Home House  Home Access Stairs to enter  Entrance Stairs-Number of Steps 4  Entrance Stairs-Rails Can reach both  Home Layout One level;Able to live on main level with bedroom/bathroom  Bathroom Shower/Tub Tub/shower unit;Curtain  Data processing manager Bedside commode/3-in-1;Walker - rolling;Shower chair without back  Prior Function  Level of Independence Independent  Able to Take Stairs? Yes  Driving Yes  Vocation On disability  Communication  Communication No difficulties  Cognition  Overall Cognitive Status Appears within functional limits for tasks assessed/performed  Arousal/Alertness Awake/alert  Orientation Level Oriented X4 / Intact  Behavior During Session [facility] for tasks performed  Right Upper Extremity Assessment  RUE ROM/Strength/Tone WFL  Left Upper Extremity Assessment  LUE ROM/Strength/Tone WFL  Right Lower Extremity Assessment  RLE ROM/Strength/Tone Deficits  RLE ROM/Strength/Tone Deficits weak quads at 3-/5 o/w >3/5  Left Lower Extremity Assessment  LLE ROM/Strength/Tone WFL  Trunk Assessment  Trunk Assessment Normal  Bed Mobility  Bed Mobility Supine to Sit;Sitting - Scoot to Edge of Bed  Supine to Sit 4: Min guard;HOB flat  Sitting - Scoot to [employer] of Bed 5: Supervision  Details for Bed Mobility Assistance vc's for safety and technique  Transfers  Transfers Sit to Stand;Stand to Sit  Sit to Stand 4: Min assist;From bed  Stand to Sit 4: Min guard;To chair/3-in-1    Details for Transfer Assistance vc's for hand placement; minor lifting assist  Ambulation/Gait  Ambulation/Gait Assistance 4: Min guard  Ambulation Distance (Feet) 120 Feet  Assistive device Rolling walker  Ambulation/Gait Assistance Details vc frequently for sequencing  Gait Pattern Step-to pattern  Stairs No  Wheelchair Mobility  Wheelchair Mobility No  Balance  Balance Assessed No  Exercises  Exercises Total Joint  Total Joint Exercises  Ankle Circles/Pumps AROM;20 reps;Supine  Quad Sets AROM;Right;10 reps;Supine  Short Arc Quad AAROM;Right;10 reps;Supine  Heel Slides AAROM;Right;10 reps;Supine  Hip ABduction/ADduction AAROM;Right;15 reps;Supine  Straight Leg Raises AAROM;10 reps;Right;Supine  PT - End of Session  Activity Tolerance Patient tolerated treatment well  Patient left in chair;with call bell/phone within reach  Nurse Communication Mobility status  CPM Right Knee  CPM Right Knee Off  PT Assessment  Clinical Impression Statement pt s/p R TKA. Presently RLE still painful and weak with quads unable to stabilize the knee in extension yet.  Rec HHPT.Marland Kitchen  PT Recommendation/Assessment Patient needs continued PT services  PT Problem List Decreased strength;Decreased range of motion;Decreased mobility;Decreased knowledge of use of DME;Pain  PT Therapy Diagnosis  Difficulty walking;Acute pain;Generalized weakness  PT Plan  PT Frequency 7X/week  PT Treatment/Interventions DME instruction;Gait training  PT Recommendation  Recommendations for Other Services OT consult  Follow Up Recommendations Home health PT  Equipment Recommended None recommended by PT  Individuals Consulted  Consulted and Agree with Results and Recommendations Patient  Acute Rehab PT Goals  PT Goal Formulation With patient  Time For Goal Achievement 10/21/11  Potential to Achieve Goals Good  Pt will go Supine/Side to Sit with  modified independence;with HOB 0 degrees  PT Goal: Supine/Side to Sit -  Progress Goal set today  Pt will go Sit to Stand with modified independence  PT Goal: Sit to Stand - Progress Goal set today  Pt will Ambulate >150 feet;with modified independence;with rolling walker  PT Goal: Ambulate - Progress Goal set today  Pt will Go Up / Down Stairs 3-5 stairs;with rail(s);with least restrictive assistive device  PT Goal: Up/Down Stairs - Progress Goal set today  Pt will Perform Home Exercise Program with supervision, verbal cues required/provided  PT Goal: Perform Home Exercise Program - Progress Goal set today  Written Expression  Dominant Hand Right   10/14/2011   Bing, PT [phone] (pager)

## 2011-10-14 NOTE — Consult Note (Signed)
ANTICOAGULATION CONSULT NOTE - Follow Up Consult  Pharmacy Consult for : Coumadin Indication: VTE prophylaxis  No Known Allergies  Patient Measurements: Height: 5\' 8"  (172.7 cm) Weight: 274 lb (124.286 kg) IBW/kg (Calculated) : 68.4    Vital Signs: Temp: 98.2 F (36.8 C) (05/02 0634) BP: 125/78 mmHg (05/02 0634) Pulse Rate: 95  (05/02 0634)  Labs:  Basename 10/14/11 0620  HGB 13.2  HCT 39.9  PLT 169  APTT --  LABPROT 14.2  INR 1.08  HEPARINUNFRC --  CREATININE 0.69  CKTOTAL --  CKMB --  TROPONINI --   Estimated Creatinine Clearance: 152.9 ml/min (by C-G formula based on Cr of 0.69).   Medications:     ALPRAZolam 0.5 mg Oral BID  amphetamine-dextroamphetamine 25 mg Oral Daily  atorvastatin 40 mg Oral q1800  ceFAZolin (ANCEF) IV 1 g Intravenous Q8H  citalopram 40 mg Oral Daily  coumadin book 1 each Does not apply Once  docusate sodium 100 mg Oral BID  enoxaparin 30 mg Subcutaneous Q12H  furosemide 80 mg Oral Daily  glipiZIDE 10 mg Oral BID AC  HYDROmorphone     insulin aspart 0-20 Units Subcutaneous TID WC  linagliptin 5 mg Oral Daily  metFORMIN 1,000 mg Oral BID WC  pantoprazole 80 mg Oral Q1200  ramipril 10 mg Oral Daily  warfarin 7.5 mg Oral ONCE-1800  warfarin 1 each Does not apply Once  Warfarin - Pharmacist Dosing Inpatient  Does not apply q1800  DISCONTD: amphetamine-dextroamphetamine 20 mg Oral Daily  DISCONTD: amphetamine-dextroamphetamine 5 mg Oral Daily  DISCONTD: prasugrel 10 mg Oral Daily    Assessment:  Post op Day # 1 s/p R-TKA in a 46 y/o male with a history of CAD with stents who is now placed on Coumadin with Lovenox bridging for VTE prophylaxis.  Effient on hold immediately post op.  INR  0.97  >>  1.08.   WBC/Hgb/Hct/Plts:  8.0/13.2/39.9/169 (05/02 1610)  No bleeding complications noted.  Goal of Therapy:   INR 2-3   Plan:   Continue Lovenox bridging.  Coumadin 7.5 mg today  Teiara Baria, Elisha Headland, Pharm.D. 10/14/2011  2:23 PM

## 2011-10-14 NOTE — Evaluation (Signed)
Occupational Therapy Evaluation Patient Details Name: Cody [name] MRN: [id number] DOB: Feb 08, 1966 Today's Date: 10/14/2011 Time: 1335-1400 OT Time Calculation (min): 25 min  OT Assessment / Plan / Recommendation Clinical Impression  This 46 y.o. male admitted for Rt. TKA.  All education completed.  Pt. does not feel he needs further OT.  He has all DME, and is able to state correct use, and wife will assist as needed.  Will sign off    OT Assessment  Patient does not need any further OT services    Follow Up Recommendations  No OT follow up;Supervision - Intermittent    Equipment Recommendations  None recommended by OT    Frequency      Precautions / Restrictions Precautions Precautions: Knee Required Braces or Orthoses: Knee Immobilizer - Right       ADL  Eating/Feeding: Simulated;Independent Where Assessed - Eating/Feeding: Chair Grooming: Simulated;Wash/dry hands;Wash/dry face;Teeth care;Brushing hair;Set up Where Assessed - Grooming: Supported sitting Upper Body Bathing: Simulated;Set up Where Assessed - Upper Body Bathing: Sitting, chair Lower Body Bathing: Simulated;Minimal assistance Where Assessed - Lower Body Bathing: Sit to stand from chair Upper Body Dressing: Simulated;Minimal assistance (due to lines) Where Assessed - Upper Body Dressing: Sitting, chair;Unsupported Lower Body Dressing: Simulated;Minimal assistance;Performed Where Assessed - Lower Body Dressing: Sit to stand from chair Toilet Transfer: Simulated;Minimal assistance Toilet Transfer Method: Ambulating Toilet Transfer Equipment: Raised toilet seat with arms (or 3-in-1 over toilet) Toileting - Clothing Manipulation: Simulated (min guard assist) Where Assessed - Glass blower/designer Manipulation: Standing Toileting - Hygiene: Simulated;Supervision/safety Where Assessed - Toileting Hygiene: Sit on 3-in-1 or toilet Equipment Used: Knee Immobilizer;Rolling walker ADL Comments: Pt. able to access foot  for LB ADLs, but requires max effort. Wife and dtr report they will assist pt. as needed.  Encouraged pt. to attempt to perform daily before accepting assistance.  Discussed tub transfer with use of bench.  Both pt. and spouse are able to state proper technique.  Pt. does not feel he wants, nor needs to practice, and states he will initially sponge bathe    OT Goals    Visit Information  Last OT Received On: 10/14/11 Assistance Needed: +1    Subjective Data  Subjective: "I'm  good, she can help me"  re: ADLs and tub transfer Patient Stated Goal: To get better   Prior Functioning  Home Living Lives With: Spouse;Daughter Available Help at Discharge: Family Type of Home: House Home Access: Stairs to enter [employer] of Steps: 4 Entrance Stairs-Rails: Can reach both Home Layout: One level;Able to live on main level with bedroom/bathroom Bathroom Shower/Tub: Tub/shower unit;Curtain Bathroom Toilet: Standard Bathroom Accessibility: Yes How Accessible: Accessible via walker Home Adaptive Equipment: Bedside commode/3-in-1;Walker - rolling;Tub transfer bench Prior Function Level of Independence: Independent Able to Take Stairs?: Yes Driving: Yes Vocation: On disability Communication Communication: No difficulties Dominant Hand: Right    Cognition  Overall Cognitive Status: Appears within functional limits for tasks assessed/performed Arousal/Alertness: Awake/alert Orientation Level: Oriented X4 / Intact Behavior During Session: [facility] for tasks performed    Extremity/Trunk Assessment Right Upper Extremity Assessment RUE ROM/Strength/Tone: Within functional levels Left Upper Extremity Assessment LUE ROM/Strength/Tone: Within functional levels            End of Session OT - End of Session Equipment Utilized During Treatment: Right knee immobilizer Activity Tolerance: Patient tolerated treatment well Patient left: in chair;with call bell/phone within reach;with  family/visitor present CPM Right Knee CPM Right Knee: Off   Garfield Coiner M 10/14/2011, 2:14  PM

## 2011-10-14 NOTE — Progress Notes (Signed)
UR COMPLETED  

## 2011-10-14 NOTE — Progress Notes (Signed)
10/14/11 1725  PT Visit Information  Last PT Received On 10/14/11  Assistance Needed +1  PT Time Calculation  PT Start Time 1700  PT Stop Time 1725  PT Time Calculation (min) 25 min  Subjective Data  Patient Stated Goal Home tomorrow--Independent  Precautions  Precautions Knee  Required Braces or Orthoses Knee Immobilizer - Right  Cognition  Overall Cognitive Status Appears within functional limits for tasks assessed/performed  Arousal/Alertness Awake/alert  Orientation Level Oriented X4 / Intact  Behavior During Session [facility] for tasks performed  Transfers  Transfers Sit to Stand;Stand to Sit  Sit to Stand 4: Min guard;From chair/3-in-1  Stand to Sit 4: Min guard;To chair/3-in-1  Details for Transfer Assistance vc's for hand placement  Ambulation/Gait  Ambulation/Gait Assistance 5: Supervision  Ambulation Distance (Feet) 180 Feet  Assistive device Rolling walker  Ambulation/Gait Assistance Details vc's for sequencing;  Gait Pattern Step-to pattern  Stairs No  Wheelchair Mobility  Wheelchair Mobility No  Balance  Balance Assessed No  Exercises  Exercises Total Joint  Total Joint Exercises  Knee Flexion AAROM;Right;Other reps (comment);Seated;Other (comment) (7 reps at max 75 degrees)  PT - End of Session  Activity Tolerance Patient tolerated treatment well;Patient limited by pain  Patient left in chair;with call bell/phone within reach  Nurse Communication Mobility status  PT - Assessment/Plan  Comments on Treatment Session On track to D/C tomorrow as pt wishes.  Pt needs to do stairs in a.m.  PT Plan Discharge plan remains appropriate  PT Frequency 7X/week  Follow Up Recommendations Home health PT  Equipment Recommended None recommended by PT  Acute Rehab PT Goals  Time For Goal Achievement 10/21/11  Potential to Achieve Goals Good  PT Goal: Supine/Side to Sit - Progress Progressing toward goal  PT Goal: Sit to Stand - Progress Progressing toward goal  PT Goal:  Ambulate - Progress Progressing toward goal  PT Goal: Perform Home Exercise Program - Progress Progressing toward goal    10/14/2011  Ethan Bing, PT [phone] (pager)

## 2011-10-15 LAB — PROTIME-INR
INR: 1.39 (ref 0.00–1.49)
Prothrombin Time: 17.3 seconds — ABNORMAL HIGH (ref 11.6–15.2)

## 2011-10-15 LAB — CBC
MCHC: 33.1 g/dL (ref 30.0–36.0)
Platelets: 166 10*3/uL (ref 150–400)
RDW: 15.6 % — ABNORMAL HIGH (ref 11.5–15.5)
WBC: 7.1 10*3/uL (ref 4.0–10.5)

## 2011-10-15 LAB — GLUCOSE, CAPILLARY: Glucose-Capillary: 171 mg/dL — ABNORMAL HIGH (ref 70–99)

## 2011-10-15 NOTE — Progress Notes (Signed)
CARE MANAGEMENT NOTE 10/15/2011  Patient:  Cody [name], Cody [name]   Account Number:  [id number]  Date Initiated:  10/15/2011  Documentation initiated by:  Vance Peper  Subjective/Objective Assessment:   46 yr old male s/p right total knee arthroplasty     Action/Plan:   Spoke with pt and wife regarding home health needs, choice offered.DME has been delivered to the home.   Anticipated DC Date:  10/15/2011   Anticipated DC Plan:  HOME W HOME HEALTH SERVICES      DC Planning Services  CM consult  CM consult      [facility] Choice  HOME HEALTH   Choice offered to / List presented to:  C-1 Patient        HH arranged  HH-2 PT      [facility] agency  [facility]   Status of service:  Completed, signed off Discharge Disposition:  HOME W HOME HEALTH SERVICES

## 2011-10-15 NOTE — Progress Notes (Signed)
PT Progress Note:     10/15/11 1400  PT Visit Information  Last PT Received On 10/15/11  Assistance Needed +1  PT Time Calculation  PT Start Time 0742  PT Stop Time 0812  PT Time Calculation (min) 30 min  Precautions  Precautions Knee  Required Braces or Orthoses Knee Immobilizer - Right  Cognition  Overall Cognitive Status Appears within functional limits for tasks assessed/performed  Arousal/Alertness Awake/alert  Orientation Level Oriented X4 / Intact  Behavior During Session [facility] for tasks performed  Bed Mobility  Bed Mobility Supine to Sit;Sit to Supine  Sitting - Scoot to Edge of Bed 6: Modified independent (Device/Increase time)  Sit to Supine 6: Modified independent (Device/Increase time);HOB flat  Transfers  Transfers Sit to Stand;Stand to Sit  Sit to Stand 5: Supervision;With upper extremity assist;From bed  Stand to Sit 5: Supervision;With upper extremity assist;To bed  Details for Transfer Assistance Cues for hand placement & technique  Ambulation/Gait  Ambulation/Gait Assistance 5: Supervision  Ambulation Distance (Feet) 120 Feet  Assistive device Rolling walker  Ambulation/Gait Assistance Details Cues to increase Lt step length & decrease reliance of UE"s on RW.    Gait Pattern Step-to pattern;Step-through pattern;Decreased stance time - right;Decreased step length - left  Stairs Yes  Stairs Assistance 4: Min guard  Stair Management Technique Two rails;Forwards;Step to pattern  Number of Stairs 2  (3x's)  Wheelchair Mobility  Wheelchair Mobility No  Balance  Balance Assessed No  Exercises  Exercises Total Joint  Total Joint Exercises  Ankle Circles/Pumps AROM;20 reps;Supine  Quad Sets AROM;Right;10 reps;Supine  Straight Leg Raises AAROM;Strengthening;Right;15 reps;Supine  Knee Flexion AAROM;Right;10 reps;Seated  PT - End of Session  Equipment Utilized During Treatment Gait belt;Right knee immobilizer  Activity Tolerance Patient tolerated treatment well   Patient left in bed;with call bell/phone within reach (PA in room to change dressing)  PT - Assessment/Plan  Comments on Treatment Session Pt continues to progress with PT goals.  Moving well enough to safely d/c home when MD feels medically ready.    PT Plan Discharge plan remains appropriate  Follow Up Recommendations Home health PT  Equipment Recommended None recommended by PT  Acute Rehab PT Goals  PT Goal: Supine/Side to Sit - Progress Met  PT Goal: Sit to Stand - Progress Progressing toward goal  PT Goal: Ambulate - Progress Progressing toward goal  PT Goal: Up/Down Stairs - Progress Progressing toward goal  PT Goal: Perform Home Exercise Program - Progress Progressing toward goal      Verdell Face, [place] [phone] 10/15/2011

## 2011-10-15 NOTE — Progress Notes (Signed)
Subjective: Doing well.  Pain controlled.  Completed stairs this morning. Ready to go home.  Objective: Vital signs in last 24 hours: Temp:  [97.7 F (36.5 C)-98.1 F (36.7 C)] 98.1 F (36.7 C) (05/03 0528) Pulse Rate:  [80-91] 80  (05/03 0528) Resp:  [20] 20  (05/03 0528) BP: (109-136)/(69-88) 111/74 mmHg (05/03 0528) SpO2:  [91 %-100 %] 91 % (05/03 0528)  Intake/Output from previous day: 05/02 0701 - 05/03 0700 In: 4973.5 [P.O.:800; I.V.:3373.5] Out: 1450 [Urine:1300; Drains:150] Intake/Output this shift:     Basename 10/15/11 0550 10/14/11 0620  HGB 11.7* 13.2    Basename 10/15/11 0550 10/14/11 0620  WBC 7.1 8.0  RBC 3.93* 4.41  HCT 35.4* 39.9  PLT 166 169    Basename 10/14/11 0620  NA 136  K 4.3  CL 99  CO2 26  BUN 10  CREATININE 0.69  GLUCOSE 222*  CALCIUM 8.7    Basename 10/15/11 0550 10/14/11 0620  LABPT -- --  INR 1.39 1.08    Exam:  Wound looks good.  Staples intact.  Drain removed.  Calf nt, nvi.  Assessment/Plan: D/c home this afternoon.  F/u 2 weeks postop.  Hx sleep apnea.  Advised must use CPAP.     Marque Bango M 10/15/2011, 8:08 AM

## 2011-10-15 NOTE — Discharge Instructions (Signed)
Home Health to be provided by [facility] [phone]

## 2011-10-19 ENCOUNTER — Ambulatory Visit: Payer: Medicaid Other | Admitting: Pulmonary Disease

## 2011-10-25 NOTE — Discharge Summary (Signed)
  ABBREVIATED DISCHARGE SUMMARY      DATE OF HOSPITALIZATION:  13 Oct 2011  REASON FOR HOSPITALIZATION:  46 yo wm with end stage djd right knee, pain and retained hardware.  Failed conservative treatment.      SIGNIFICANT FINDINGS:  DJD, tibial screw  OPERATION:  Right total knee replacement and proximal tibial screw removal  FINAL DIAGNOSIS:  same  SECONDARY DIAGNOSIS: none  CONSULTANTS:  none  DISCHARGE CONDITION:  STABLE  DISCHARGED TO:  HOME

## 2011-11-25 ENCOUNTER — Telehealth (INDEPENDENT_AMBULATORY_CARE_PROVIDER_SITE_OTHER): Payer: Self-pay

## 2011-11-25 NOTE — Telephone Encounter (Signed)
The patient made an appointment to see Dr Magnus Ivan to follow up an area that was removed last year.  He saw the pt but then had an emergency and dr Zachery Dakins did the surgery.  He is having some stinging and burning and if you get a cancellation please call to move him up sooner.

## 2011-12-10 ENCOUNTER — Encounter (INDEPENDENT_AMBULATORY_CARE_PROVIDER_SITE_OTHER): Payer: Self-pay | Admitting: Surgery

## 2011-12-13 ENCOUNTER — Other Ambulatory Visit: Payer: Self-pay | Admitting: Cardiology

## 2011-12-14 ENCOUNTER — Ambulatory Visit (INDEPENDENT_AMBULATORY_CARE_PROVIDER_SITE_OTHER): Payer: Self-pay | Admitting: Surgery

## 2012-01-06 ENCOUNTER — Encounter (INDEPENDENT_AMBULATORY_CARE_PROVIDER_SITE_OTHER): Payer: Self-pay | Admitting: Surgery

## 2012-03-21 ENCOUNTER — Ambulatory Visit (INDEPENDENT_AMBULATORY_CARE_PROVIDER_SITE_OTHER): Payer: Medicaid Other | Admitting: Surgery

## 2012-03-21 ENCOUNTER — Encounter (INDEPENDENT_AMBULATORY_CARE_PROVIDER_SITE_OTHER): Payer: Self-pay | Admitting: Surgery

## 2012-03-21 VITALS — BP 130/80 | HR 80 | Temp 98.6°F | Resp 18 | Ht 66.0 in | Wt 251.6 lb

## 2012-03-21 DIAGNOSIS — L732 Hidradenitis suppurativa: Secondary | ICD-10-CM

## 2012-03-21 NOTE — Progress Notes (Signed)
Subjective:     Patient ID: DAL BLEW, male   DOB: [birth year], 46 y.o.   MRN: [id number]  HPI This is a gentleman who I originally saw with a chronic infection on his right buttock. He had a wide excision of this area by Dr. Zachery Dakins and it eventually closed with a wound VAC. He now reports that it has recurred  Review of Systems     Objective:   Physical Exam  on exam, there is indeed erythema at the top part of the incision and I expressible purulence.I performed a incision and drainage of this area after prepping with Betadine and anesthetizing with lidocaine. Only a small amount of purulence was identified    Assessment:     Chronic hidradenitis of the right buttock    Plan:     I will start him on doxycycline for 3 weeks. I will see him back and see how he is doing. He may eventually again need a wide excision.

## 2012-03-22 ENCOUNTER — Telehealth (INDEPENDENT_AMBULATORY_CARE_PROVIDER_SITE_OTHER): Payer: Self-pay

## 2012-03-22 NOTE — Telephone Encounter (Addendum)
Patient seen in office on 03/21/12 for Hidradenitis, patient called requesting a prescription for pain medication. Patient reports that OTC pain relievers or Advil is not helping with controlling his pain.  Please advise

## 2012-04-17 ENCOUNTER — Ambulatory Visit (INDEPENDENT_AMBULATORY_CARE_PROVIDER_SITE_OTHER): Payer: Medicaid Other | Admitting: Surgery

## 2012-04-17 ENCOUNTER — Encounter (INDEPENDENT_AMBULATORY_CARE_PROVIDER_SITE_OTHER): Payer: Self-pay | Admitting: Surgery

## 2012-04-17 VITALS — BP 116/74 | HR 76 | Temp 97.2°F | Resp 12 | Ht 66.0 in | Wt 253.6 lb

## 2012-04-17 DIAGNOSIS — L732 Hidradenitis suppurativa: Secondary | ICD-10-CM | POA: Insufficient documentation

## 2012-04-17 NOTE — Progress Notes (Signed)
Subjective:     Patient ID: Cody [name], male   DOB: 10-29-1965, 45 y.o.   MRN: [id number]  HPI He is here for another reevaluation of the chronic hidradenitis on his right buttock. Again, this was widely excised a year and a half ago. It has now recurred. He has improved with the doxycycline but is interested in having another excision  Review of Systems     Objective:   Physical Exam On exam, there is no purulence today and minimal erythema of the right buttocks    Assessment:     Right buttock hidradenitis    Plan:     After discussion with him, he would like to go and proceed with reexcision of this area which I feel is reasonable. Surgery will thus be scheduled

## 2012-05-01 ENCOUNTER — Other Ambulatory Visit (HOSPITAL_COMMUNITY): Payer: Self-pay | Admitting: Cardiology

## 2012-05-01 DIAGNOSIS — R079 Chest pain, unspecified: Secondary | ICD-10-CM

## 2012-05-10 ENCOUNTER — Encounter (HOSPITAL_COMMUNITY): Payer: Medicaid Other

## 2012-05-22 ENCOUNTER — Encounter (HOSPITAL_COMMUNITY): Admission: RE | Admit: 2012-05-22 | Payer: Medicaid Other | Source: Ambulatory Visit

## 2012-05-22 ENCOUNTER — Encounter (HOSPITAL_COMMUNITY): Payer: Medicaid Other

## 2012-05-31 ENCOUNTER — Encounter (HOSPITAL_COMMUNITY): Payer: Medicaid Other

## 2012-06-04 ENCOUNTER — Other Ambulatory Visit (INDEPENDENT_AMBULATORY_CARE_PROVIDER_SITE_OTHER): Payer: Self-pay | Admitting: Surgery

## 2012-06-09 ENCOUNTER — Encounter (HOSPITAL_COMMUNITY)
Admission: RE | Admit: 2012-06-09 | Discharge: 2012-06-09 | Disposition: A | Discharge status: Home / Self Care | Payer: Medicaid Other | Source: Ambulatory Visit | Attending: Cardiology | Admitting: Cardiology

## 2012-06-09 ENCOUNTER — Other Ambulatory Visit: Payer: Self-pay

## 2012-06-09 DIAGNOSIS — I259 Chronic ischemic heart disease, unspecified: Secondary | ICD-10-CM | POA: Insufficient documentation

## 2012-06-09 DIAGNOSIS — R079 Chest pain, unspecified: Secondary | ICD-10-CM | POA: Insufficient documentation

## 2012-06-09 MED ORDER — TECHNETIUM TC 99M SESTAMIBI GENERIC - CARDIOLITE
10.0000 | Freq: Once | INTRAVENOUS | Status: AC | PRN
  Administered 2012-06-09: 10 via INTRAVENOUS

## 2012-06-09 MED ORDER — REGADENOSON 0.4 MG/5ML IV SOLN
INTRAVENOUS | Status: AC
  Filled 2012-06-09: qty 5

## 2012-06-09 MED ORDER — REGADENOSON 0.4 MG/5ML IV SOLN
0.4000 mg | Freq: Once | INTRAVENOUS | Status: AC
  Administered 2012-06-09: 0.4 mg via INTRAVENOUS

## 2012-06-09 MED ORDER — TECHNETIUM TC 99M SESTAMIBI GENERIC - CARDIOLITE
30.0000 | Freq: Once | INTRAVENOUS | Status: AC | PRN
  Administered 2012-06-09: 30 via INTRAVENOUS

## 2012-06-14 HISTORY — PX: CARDIAC CATHETERIZATION: SHX172

## 2012-06-14 MED ORDER — CEFAZOLIN SODIUM-DEXTROSE 2-3 GM-% IV SOLR
2.0000 g | INTRAVENOUS | Status: DC
  Filled 2012-06-14: qty 50

## 2012-06-15 ENCOUNTER — Ambulatory Visit (HOSPITAL_COMMUNITY)
Admission: RE | Admit: 2012-06-15 | Discharge: 2012-06-15 | Disposition: A | Discharge status: Home / Self Care | Payer: Medicaid Other | Source: Ambulatory Visit | Attending: Cardiology | Admitting: Cardiology

## 2012-06-15 ENCOUNTER — Encounter (HOSPITAL_COMMUNITY)
Admission: RE | Disposition: A | Discharge status: Home / Self Care | Payer: Self-pay | Source: Ambulatory Visit | Attending: Cardiology

## 2012-06-15 DIAGNOSIS — I10 Essential (primary) hypertension: Secondary | ICD-10-CM | POA: Insufficient documentation

## 2012-06-15 DIAGNOSIS — E119 Type 2 diabetes mellitus without complications: Secondary | ICD-10-CM | POA: Insufficient documentation

## 2012-06-15 DIAGNOSIS — E7800 Pure hypercholesterolemia, unspecified: Secondary | ICD-10-CM | POA: Insufficient documentation

## 2012-06-15 DIAGNOSIS — T82897A Other specified complication of cardiac prosthetic devices, implants and grafts, initial encounter: Secondary | ICD-10-CM | POA: Insufficient documentation

## 2012-06-15 DIAGNOSIS — F17200 Nicotine dependence, unspecified, uncomplicated: Secondary | ICD-10-CM | POA: Insufficient documentation

## 2012-06-15 DIAGNOSIS — I2582 Chronic total occlusion of coronary artery: Secondary | ICD-10-CM | POA: Insufficient documentation

## 2012-06-15 DIAGNOSIS — I2510 Atherosclerotic heart disease of native coronary artery without angina pectoris: Secondary | ICD-10-CM | POA: Insufficient documentation

## 2012-06-15 DIAGNOSIS — G4733 Obstructive sleep apnea (adult) (pediatric): Secondary | ICD-10-CM | POA: Insufficient documentation

## 2012-06-15 DIAGNOSIS — I252 Old myocardial infarction: Secondary | ICD-10-CM | POA: Insufficient documentation

## 2012-06-15 DIAGNOSIS — E662 Morbid (severe) obesity with alveolar hypoventilation: Secondary | ICD-10-CM | POA: Insufficient documentation

## 2012-06-15 DIAGNOSIS — Y831 Surgical operation with implant of artificial internal device as the cause of abnormal reaction of the patient, or of later complication, without mention of misadventure at the time of the procedure: Secondary | ICD-10-CM | POA: Insufficient documentation

## 2012-06-15 HISTORY — PX: LEFT HEART CATHETERIZATION WITH CORONARY ANGIOGRAM: SHX5451

## 2012-06-15 LAB — GLUCOSE, CAPILLARY: Glucose-Capillary: 175 mg/dL — ABNORMAL HIGH (ref 70–99)

## 2012-06-15 SURGERY — LEFT HEART CATHETERIZATION WITH CORONARY ANGIOGRAM
Anesthesia: LOCAL

## 2012-06-15 MED ORDER — HEPARIN (PORCINE) IN NACL 2-0.9 UNIT/ML-% IJ SOLN
INTRAMUSCULAR | Status: AC
  Filled 2012-06-15: qty 1500

## 2012-06-15 MED ORDER — SODIUM CHLORIDE 0.9 % IV SOLN
INTRAVENOUS | Status: DC
  Administered 2012-06-15: 08:00:00 via INTRAVENOUS

## 2012-06-15 MED ORDER — ONDANSETRON HCL 4 MG/2ML IJ SOLN: 4.0000 mg | Freq: Four times a day (QID) | INTRAMUSCULAR | Status: DC | PRN

## 2012-06-15 MED ORDER — METFORMIN HCL 500 MG PO TABS: 1000.0000 mg | ORAL_TABLET | Freq: Two times a day (BID) | ORAL | Status: DC

## 2012-06-15 MED ORDER — ACETAMINOPHEN 325 MG PO TABS: 650.0000 mg | ORAL_TABLET | ORAL | Status: DC | PRN

## 2012-06-15 MED ORDER — DIAZEPAM 5 MG PO TABS
5.0000 mg | ORAL_TABLET | ORAL | Status: AC
  Administered 2012-06-15: 5 mg via ORAL
  Filled 2012-06-15: qty 1

## 2012-06-15 MED ORDER — SODIUM CHLORIDE 0.9 % IJ SOLN: 3.0000 mL | Freq: Two times a day (BID) | INTRAMUSCULAR | Status: DC

## 2012-06-15 MED ORDER — ASPIRIN 81 MG PO CHEW
324.0000 mg | CHEWABLE_TABLET | ORAL | Status: AC
  Administered 2012-06-15: 324 mg via ORAL
  Filled 2012-06-15: qty 4

## 2012-06-15 MED ORDER — LIDOCAINE HCL (PF) 1 % IJ SOLN
INTRAMUSCULAR | Status: AC
  Filled 2012-06-15: qty 30

## 2012-06-15 MED ORDER — FENTANYL CITRATE 0.05 MG/ML IJ SOLN
INTRAMUSCULAR | Status: AC
  Filled 2012-06-15: qty 2

## 2012-06-15 MED ORDER — OXYCODONE-ACETAMINOPHEN 5-325 MG PO TABS: 1.0000 | ORAL_TABLET | ORAL | Status: DC | PRN

## 2012-06-15 MED ORDER — MIDAZOLAM HCL 2 MG/2ML IJ SOLN
INTRAMUSCULAR | Status: AC
  Filled 2012-06-15: qty 2

## 2012-06-15 MED ORDER — NITROGLYCERIN 0.2 MG/ML ON CALL CATH LAB
INTRAVENOUS | Status: AC
  Filled 2012-06-15: qty 1

## 2012-06-15 MED ORDER — SODIUM CHLORIDE 0.9 % IV SOLN: 250.0000 mL | INTRAVENOUS | Status: DC | PRN

## 2012-06-15 MED ORDER — SODIUM CHLORIDE 0.9 % IJ SOLN: 3.0000 mL | INTRAMUSCULAR | Status: DC | PRN

## 2012-06-15 MED ORDER — SODIUM CHLORIDE 0.9 % IV SOLN: INTRAVENOUS | Status: DC

## 2012-06-15 NOTE — CV Procedure (Signed)
Left cardiac cath report dictated 06/15/2012 dictation number is [id number]

## 2012-06-15 NOTE — H&P (Signed)
  Hand written H&P in the chart please scan 

## 2012-06-15 NOTE — Cardiovascular Report (Signed)
NAMECLEBURNE, SAVINI                   ACCOUNT NO.:  [id number]  MEDICAL RECORD NO.:  [id number]  LOCATION:  [facility]                         FACILITY:  [facility]  PHYSICIAN:  Jadis Mika N. Sharyn Lull, M.D. DATE OF BIRTH:  1965/11/20  DATE OF PROCEDURE:  06/15/2012 DATE OF DISCHARGE:  06/15/2012                           CARDIAC CATHETERIZATION   PROCEDURE:  Left cardiac cath with selective left and right coronary angiography, left ventriculography via right groin using Judkins technique.  INDICATION FOR THE PROCEDURE:  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  He has chronically occluded LAD, hypertension, non-insulin-dependent diabetes mellitus, hypercholesteremia, obstructive sleep apnea, obesity hypoventilation syndrome, morbid obesity, tobacco abuse, complains of retrosternal chest pain/tightness radiating to the right arm associated with feeling weak, tired, and dizzy.  Also complains of exertional dyspnea with minimal exertion.  Denies any nausea, vomiting, diaphoresis.  Denies palpitation, lightheadedness, or syncope.  The patient underwent Lexiscan Myoview on June 09, 2012, which showed mild septal and anteroseptal wall ischemia with fixed apical defect with EF of 51%.  The patient denies any rest or nocturnal angina.  Due to multiple risk factors, chest pain, and mildly abnormal Lexiscan Myoview. Discussed with the patient regarding left cath, possible PTCA stenting, its risks and benefits, i.e., death, MI, stroke, need for emergency CABG, local vascular complications, etc., and consented for PCI.  PROCEDURE:  After obtaining the informed consent, the patient was brought to the cath lab and was placed on fluoroscopy table.  Right groin was prepped and draped in usual fashion.  Xylocaine 1% was used for local anesthesia in the right groin.  With the help of  thin-wall needle, 6-French arterial sheath was placed.  The sheath was aspirated and flushed.  Next, 6-French left Judkins catheter was advanced over the wire under fluoroscopic guidance up to the ascending aorta.  Wire was pulled out.  The catheter was aspirated and connected to the Manifold. Catheter was further advanced and engaged into left coronary ostium. Multiple views of the left system were taken.  Next, the catheter was disengaged and was pulled out over the wire and was replaced with 6- [place] right Judkins catheter, which was advanced over the wire under fluoroscopic guidance up to the ascending aorta.  Wire was pulled out. The catheter was aspirated and connected to the Manifold.  Catheter was further advanced and engaged into right coronary ostium.  Multiple views of the right system were taken.  Next, catheter was disengaged and was pulled out over the wire and was replaced with 6-French pigtail catheter, which was advanced over the wire under fluoroscopic guidance up to the ascending aorta.  Catheter was further advanced across the aortic valve into the LV.  LV pressures were recorded.  Next, LV graphy was done in 30-degree RAO position.  Post-angiographic pressures were recorded from LV and then pullback pressures were recorded from aorta. There was no gradient across the aortic valve.  Next, the pigtail catheter was pulled out over the wire.  Sheaths were aspirated and flushed.  FINDINGS:  LV showed good LV systolic function,  EF of 50% to 55%, mild LVH.  Left main was patent.  LAD has about 40% to 45% proximal in-stent restenosis and then 100% occluded as before filling faintly by collateral channels from the circumflex system.  Diagonal 1 is moderate sized, which is patent.  Stented segment is also patent.  Ramus is very small, which is patent.  Left circumflex is patent.  OM 1 is moderate sized, which is patent.  OM 2 is small, which is patent.  RCA has 10% to 15%  proximal and mid stenosis. PDA and PLV branches were patent.  The patient tolerated procedure well. There were no complications.  The patient was transferred to recovery room in stable condition.  Plan is to maximize antianginal medications and treat medically.     Eduardo Osier. Sharyn Lull, M.D.     [facility]/MEDQ  D:  06/15/2012  T:  06/15/2012  Job:  [id number]

## 2012-08-06 ENCOUNTER — Other Ambulatory Visit (INDEPENDENT_AMBULATORY_CARE_PROVIDER_SITE_OTHER): Payer: Self-pay | Admitting: Surgery

## 2012-09-29 ENCOUNTER — Other Ambulatory Visit (INDEPENDENT_AMBULATORY_CARE_PROVIDER_SITE_OTHER): Payer: Self-pay | Admitting: Surgery

## 2012-10-03 ENCOUNTER — Ambulatory Visit (INDEPENDENT_AMBULATORY_CARE_PROVIDER_SITE_OTHER): Payer: Medicaid Other | Admitting: Pulmonary Disease

## 2012-10-03 ENCOUNTER — Encounter: Payer: Self-pay | Admitting: Pulmonary Disease

## 2012-10-03 VITALS — BP 118/72 | HR 78 | Temp 97.7°F | Ht 66.0 in | Wt 243.0 lb

## 2012-10-03 DIAGNOSIS — G4733 Obstructive sleep apnea (adult) (pediatric): Secondary | ICD-10-CM

## 2012-10-03 NOTE — Assessment & Plan Note (Signed)
The patient has a history of very severe obstructive sleep apnea, and was started on CPAP at his last visit and has not been heard from since.  The patient quit using the device because of a feeling of claustrophobia, and it sounds like he was not getting enough pressure.  I stressed to him the importance of aggressive treatment of his sleep apnea, and would like for him to restart CPAP on the automatic setting.  I have also encouraged him to work aggressively on weight loss.

## 2012-10-03 NOTE — Patient Instructions (Addendum)
Will have your cpap machine set on the auto setting, and will get a download off your device in a few weeks. Work with your equipment provider on a well fitting mask Please call if having issues with cpap tolerance followup with me in 8 weeks.

## 2012-10-03 NOTE — Progress Notes (Signed)
  Subjective:    Patient ID: Cody [name], male    DOB: Mar 01, 1966, 47 y.o.   MRN: [id number]  HPI Patient comes in today for followup of his very severe obstructive sleep apnea.  At his last visit, he was started on CPAP, and he has not followed up since.  He quit using the device, and from his description he felt he was not getting enough pressure.  He is also having issues with his mask fit.   Review of Systems  Constitutional: Negative for fever and unexpected weight change.  HENT: Negative for ear pain, nosebleeds, congestion, sore throat, rhinorrhea, sneezing, trouble swallowing, dental problem, postnasal drip and sinus pressure.   Eyes: Negative for redness and itching.  Respiratory: Positive for cough and wheezing. Negative for chest tightness and shortness of breath.   Cardiovascular: Positive for leg swelling ( right leg). Negative for palpitations.  Gastrointestinal: Negative for nausea and vomiting.  Genitourinary: Negative for dysuria.  Musculoskeletal: Negative for joint swelling.  Skin: Negative for rash.  Neurological: Negative for headaches.  Hematological: Does not bruise/bleed easily.  Psychiatric/Behavioral: Negative for dysphoric mood. The patient is not nervous/anxious.        Objective:   Physical Exam Obese male in no acute distress No skin breakdown or pressure necrosis from the CPAP mask Neck without lymphadenopathy or thyromegaly Lower extremities with edema noted, no cyanosis Awake, but appears to be sleepy, moves all 4 extremities.       Assessment & Plan:

## 2012-10-16 ENCOUNTER — Ambulatory Visit (INDEPENDENT_AMBULATORY_CARE_PROVIDER_SITE_OTHER): Payer: Medicaid Other | Admitting: Surgery

## 2012-10-16 ENCOUNTER — Encounter (INDEPENDENT_AMBULATORY_CARE_PROVIDER_SITE_OTHER): Payer: Self-pay | Admitting: Surgery

## 2012-10-16 VITALS — BP 108/86 | HR 76 | Temp 97.4°F | Resp 16 | Ht 66.0 in | Wt 240.8 lb

## 2012-10-16 DIAGNOSIS — L732 Hidradenitis suppurativa: Secondary | ICD-10-CM

## 2012-10-16 NOTE — Progress Notes (Signed)
Subjective:     Patient ID: Cody [name], male   DOB: Aug 11, 1965, 47 y.o.   MRN: [id number]  HPI He is here for a followup of his chronic right buttock hidradenitis. He has had a wide excision of this area by Dr. Zachery Dakins but still occasionally is draining areas. He has been on chronic antibiotics.  Review of Systems     Objective:   Physical Exam Today, on examination the wound is completely closed. There is a large area of scar on his right buttock    Assessment:     Chronic hidradenitis of the right buttock     Plan:     I will now proceed with a formal wide excision of this area with hopes of closing the defect

## 2012-10-24 ENCOUNTER — Encounter (HOSPITAL_BASED_OUTPATIENT_CLINIC_OR_DEPARTMENT_OTHER): Payer: Self-pay | Admitting: *Deleted

## 2012-10-24 NOTE — Progress Notes (Signed)
Pt had card cath 1/14-no stents put in then-has 3-had total knee 5/13-did well-had ankle surgery here 11/12- Called dr Sharyn Lull for notes-pt has severe OSA=using cpap- now-told him to bring all meds, cpap, and overnight bag just in case he needs to stay overnight- To come in 5/16 for bmet since sugery first Monday am.

## 2012-10-26 NOTE — Progress Notes (Signed)
Did review with dr Charlena Cross

## 2012-10-27 ENCOUNTER — Encounter (HOSPITAL_BASED_OUTPATIENT_CLINIC_OR_DEPARTMENT_OTHER)
Admission: RE | Admit: 2012-10-27 | Discharge: 2012-10-27 | Disposition: A | Discharge status: Home / Self Care | Payer: Medicaid Other | Source: Ambulatory Visit | Attending: Surgery | Admitting: Surgery

## 2012-10-27 LAB — BASIC METABOLIC PANEL
Calcium: 9.3 mg/dL (ref 8.4–10.5)
GFR calc Af Amer: >90 mL/min (ref >90)
GFR calc non Af Amer: >90 mL/min (ref >90)
Glucose, Bld: 102 mg/dL — ABNORMAL HIGH (ref 70–99)
Potassium: 4.5 mEq/L (ref 3.5–5.1)
Sodium: 135 mEq/L (ref 135–145)

## 2012-10-30 ENCOUNTER — Ambulatory Visit (HOSPITAL_BASED_OUTPATIENT_CLINIC_OR_DEPARTMENT_OTHER)
Admission: RE | Admit: 2012-10-30 | Discharge: 2012-10-30 | Disposition: A | Discharge status: Home / Self Care | Payer: Medicaid Other | Source: Ambulatory Visit | Attending: Surgery | Admitting: Surgery

## 2012-10-30 ENCOUNTER — Encounter (HOSPITAL_BASED_OUTPATIENT_CLINIC_OR_DEPARTMENT_OTHER): Payer: Self-pay | Admitting: *Deleted

## 2012-10-30 ENCOUNTER — Encounter (HOSPITAL_BASED_OUTPATIENT_CLINIC_OR_DEPARTMENT_OTHER): Payer: Self-pay | Admitting: Certified Registered Nurse Anesthetist

## 2012-10-30 ENCOUNTER — Ambulatory Visit (HOSPITAL_BASED_OUTPATIENT_CLINIC_OR_DEPARTMENT_OTHER): Payer: Medicaid Other | Admitting: Certified Registered Nurse Anesthetist

## 2012-10-30 ENCOUNTER — Encounter (HOSPITAL_BASED_OUTPATIENT_CLINIC_OR_DEPARTMENT_OTHER)
Admission: RE | Disposition: A | Discharge status: Home / Self Care | Payer: Self-pay | Source: Ambulatory Visit | Attending: Surgery

## 2012-10-30 DIAGNOSIS — L732 Hidradenitis suppurativa: Secondary | ICD-10-CM | POA: Insufficient documentation

## 2012-10-30 DIAGNOSIS — F909 Attention-deficit hyperactivity disorder, unspecified type: Secondary | ICD-10-CM | POA: Insufficient documentation

## 2012-10-30 DIAGNOSIS — G4730 Sleep apnea, unspecified: Secondary | ICD-10-CM | POA: Insufficient documentation

## 2012-10-30 DIAGNOSIS — F3289 Other specified depressive episodes: Secondary | ICD-10-CM | POA: Insufficient documentation

## 2012-10-30 DIAGNOSIS — K219 Gastro-esophageal reflux disease without esophagitis: Secondary | ICD-10-CM | POA: Insufficient documentation

## 2012-10-30 DIAGNOSIS — Z9861 Coronary angioplasty status: Secondary | ICD-10-CM | POA: Insufficient documentation

## 2012-10-30 DIAGNOSIS — I252 Old myocardial infarction: Secondary | ICD-10-CM | POA: Insufficient documentation

## 2012-10-30 DIAGNOSIS — E785 Hyperlipidemia, unspecified: Secondary | ICD-10-CM | POA: Insufficient documentation

## 2012-10-30 DIAGNOSIS — E119 Type 2 diabetes mellitus without complications: Secondary | ICD-10-CM | POA: Insufficient documentation

## 2012-10-30 DIAGNOSIS — I10 Essential (primary) hypertension: Secondary | ICD-10-CM | POA: Insufficient documentation

## 2012-10-30 DIAGNOSIS — M129 Arthropathy, unspecified: Secondary | ICD-10-CM | POA: Insufficient documentation

## 2012-10-30 DIAGNOSIS — F329 Major depressive disorder, single episode, unspecified: Secondary | ICD-10-CM | POA: Insufficient documentation

## 2012-10-30 DIAGNOSIS — I2510 Atherosclerotic heart disease of native coronary artery without angina pectoris: Secondary | ICD-10-CM | POA: Insufficient documentation

## 2012-10-30 HISTORY — PX: HYDRADENITIS EXCISION: SHX5243

## 2012-10-30 LAB — GLUCOSE, CAPILLARY: Glucose-Capillary: 94 mg/dL (ref 70–99)

## 2012-10-30 SURGERY — EXCISION, HIDRADENITIS, INGUINAL REGION
Anesthesia: General | Site: Buttocks | Laterality: Right | Wound class: Clean Contaminated

## 2012-10-30 MED ORDER — OXYCODONE HCL 5 MG/5ML PO SOLN: 5.0000 mg | Freq: Once | ORAL | Status: AC | PRN

## 2012-10-30 MED ORDER — HYDROMORPHONE HCL PF 1 MG/ML IJ SOLN: 0.2500 mg | INTRAMUSCULAR | Status: DC | PRN

## 2012-10-30 MED ORDER — SODIUM CHLORIDE 0.9 % IJ SOLN: 3.0000 mL | INTRAMUSCULAR | Status: DC | PRN

## 2012-10-30 MED ORDER — SUCCINYLCHOLINE CHLORIDE 20 MG/ML IJ SOLN
INTRAMUSCULAR | Status: DC | PRN
  Administered 2012-10-30: 140 mg via INTRAVENOUS

## 2012-10-30 MED ORDER — ONDANSETRON HCL 4 MG/2ML IJ SOLN
INTRAMUSCULAR | Status: DC | PRN
  Administered 2012-10-30: 4 mg via INTRAVENOUS

## 2012-10-30 MED ORDER — DOXYCYCLINE HYCLATE 100 MG PO TABS: 100.0000 mg | ORAL_TABLET | Freq: Two times a day (BID) | ORAL | Status: DC

## 2012-10-30 MED ORDER — BUPIVACAINE-EPINEPHRINE 0.5% -1:200000 IJ SOLN
INTRAMUSCULAR | Status: DC | PRN
  Administered 2012-10-30: 20 mL

## 2012-10-30 MED ORDER — ONDANSETRON HCL 4 MG/2ML IJ SOLN: 4.0000 mg | Freq: Once | INTRAMUSCULAR | Status: DC | PRN

## 2012-10-30 MED ORDER — ONDANSETRON HCL 4 MG/2ML IJ SOLN: 4.0000 mg | Freq: Four times a day (QID) | INTRAMUSCULAR | Status: DC | PRN

## 2012-10-30 MED ORDER — MIDAZOLAM HCL 5 MG/5ML IJ SOLN
INTRAMUSCULAR | Status: DC | PRN
  Administered 2012-10-30: 2 mg via INTRAVENOUS

## 2012-10-30 MED ORDER — OXYCODONE HCL 5 MG PO TABS
5.0000 mg | ORAL_TABLET | Freq: Once | ORAL | Status: AC | PRN
Start: 2012-10-30 — End: 2012-10-30
  Administered 2012-10-30: 5 mg via ORAL

## 2012-10-30 MED ORDER — FENTANYL CITRATE 0.05 MG/ML IJ SOLN
INTRAMUSCULAR | Status: DC | PRN
  Administered 2012-10-30 (×2): 100 ug via INTRAVENOUS

## 2012-10-30 MED ORDER — OXYCODONE HCL 5 MG PO TABS: 5.0000 mg | ORAL_TABLET | ORAL | Status: DC | PRN

## 2012-10-30 MED ORDER — ACETAMINOPHEN 325 MG PO TABS: 650.0000 mg | ORAL_TABLET | ORAL | Status: DC | PRN

## 2012-10-30 MED ORDER — GLYCOPYRROLATE 0.2 MG/ML IJ SOLN
INTRAMUSCULAR | Status: DC | PRN
  Administered 2012-10-30: 0.2 mg via INTRAVENOUS

## 2012-10-30 MED ORDER — DEXAMETHASONE SODIUM PHOSPHATE 4 MG/ML IJ SOLN
INTRAMUSCULAR | Status: DC | PRN
  Administered 2012-10-30: 5 mg via INTRAVENOUS

## 2012-10-30 MED ORDER — SODIUM CHLORIDE 0.9 % IJ SOLN: 3.0000 mL | Freq: Two times a day (BID) | INTRAMUSCULAR | Status: DC

## 2012-10-30 MED ORDER — DEXTROSE 5 % IV SOLN
3.0000 g | INTRAVENOUS | Status: AC
  Administered 2012-10-30: 3 g via INTRAVENOUS

## 2012-10-30 MED ORDER — PROPOFOL 10 MG/ML IV BOLUS
INTRAVENOUS | Status: DC | PRN
  Administered 2012-10-30: 200 mg via INTRAVENOUS

## 2012-10-30 MED ORDER — LIDOCAINE HCL (CARDIAC) 20 MG/ML IV SOLN
INTRAVENOUS | Status: DC | PRN
  Administered 2012-10-30: 100 mg via INTRAVENOUS

## 2012-10-30 MED ORDER — SODIUM CHLORIDE 0.9 % IV SOLN: 250.0000 mL | INTRAVENOUS | Status: DC | PRN

## 2012-10-30 MED ORDER — LACTATED RINGERS IV SOLN
INTRAVENOUS | Status: DC
  Administered 2012-10-30 (×2): via INTRAVENOUS

## 2012-10-30 MED ORDER — ACETAMINOPHEN 650 MG RE SUPP: 650.0000 mg | RECTAL | Status: DC | PRN

## 2012-10-30 MED ORDER — OXYCODONE-ACETAMINOPHEN 5-325 MG PO TABS: 2.0000 | ORAL_TABLET | ORAL | Status: DC | PRN

## 2012-10-30 MED ORDER — MORPHINE SULFATE 4 MG/ML IJ SOLN: 4.0000 mg | INTRAMUSCULAR | Status: DC | PRN

## 2012-10-30 SURGICAL SUPPLY — 39 items
BLADE HEX COATED 2.75 (ELECTRODE) ×2 IMPLANT
BLADE SURG 15 STRL LF DISP TIS (BLADE) ×1 IMPLANT
BLADE SURG 15 STRL SS (BLADE) ×2
BLADE SURG ROTATE 9660 (MISCELLANEOUS) IMPLANT
CANISTER SUCTION 1200CC (MISCELLANEOUS) ×2 IMPLANT
CHLORAPREP W/TINT 26ML (MISCELLANEOUS) ×1 IMPLANT
CLOTH BEACON ORANGE TIMEOUT ST (SAFETY) ×2 IMPLANT
COVER MAYO STAND STRL (DRAPES) ×2 IMPLANT
COVER TABLE BACK 60X90 (DRAPES) ×2 IMPLANT
DECANTER SPIKE VIAL GLASS SM (MISCELLANEOUS) IMPLANT
DRAPE LAPAROTOMY T 102X78X121 (DRAPES) IMPLANT
DRAPE PED LAPAROTOMY (DRAPES) ×2 IMPLANT
DRAPE UTILITY XL STRL (DRAPES) ×1 IMPLANT
DRSG PAD ABDOMINAL 8X10 ST (GAUZE/BANDAGES/DRESSINGS) ×1 IMPLANT
ELECT REM PT RETURN 9FT ADLT (ELECTROSURGICAL) ×2
ELECTRODE REM PT RTRN 9FT ADLT (ELECTROSURGICAL) ×1 IMPLANT
GAUZE SPONGE 4X4 12PLY STRL LF (GAUZE/BANDAGES/DRESSINGS) ×3 IMPLANT
GLOVE ECLIPSE 6.5 STRL STRAW (GLOVE) ×1 IMPLANT
GLOVE INDICATOR 7.0 STRL GRN (GLOVE) ×2 IMPLANT
GLOVE SURG SIGNA 7.5 PF LTX (GLOVE) ×2 IMPLANT
GOWN PREVENTION PLUS XLARGE (GOWN DISPOSABLE) ×2 IMPLANT
GOWN PREVENTION PLUS XXLARGE (GOWN DISPOSABLE) ×2 IMPLANT
NDL HYPO 25X1 1.5 SAFETY (NEEDLE) ×1 IMPLANT
NEEDLE HYPO 25X1 1.5 SAFETY (NEEDLE) ×2 IMPLANT
NS IRRIG 1000ML POUR BTL (IV SOLUTION) ×2 IMPLANT
PACK BASIN DAY SURGERY FS (CUSTOM PROCEDURE TRAY) ×2 IMPLANT
PENCIL BUTTON HOLSTER BLD 10FT (ELECTRODE) ×2 IMPLANT
SLEEVE SCD COMPRESS KNEE MED (MISCELLANEOUS) ×1 IMPLANT
SPONGE LAP 4X18 X RAY DECT (DISPOSABLE) ×2 IMPLANT
SUT ETHILON 2 0 FS 18 (SUTURE) ×2 IMPLANT
SUT ETHILON 3 0 FSL (SUTURE) IMPLANT
SUT VIC AB 2-0 SH 27 (SUTURE) ×2
SUT VIC AB 2-0 SH 27XBRD (SUTURE) IMPLANT
SYR BULB 3OZ (MISCELLANEOUS) ×1 IMPLANT
SYR CONTROL 10ML LL (SYRINGE) ×2 IMPLANT
TOWEL OR 17X24 6PK STRL BLUE (TOWEL DISPOSABLE) ×2 IMPLANT
TOWEL OR NON WOVEN STRL DISP B (DISPOSABLE) ×1 IMPLANT
TUBE CONNECTING 20X1/4 (TUBING) ×2 IMPLANT
YANKAUER SUCT BULB TIP NO VENT (SUCTIONS) ×2 IMPLANT

## 2012-10-30 NOTE — Interval H&P Note (Signed)
History and Physical Interval Note: no change in H and P  10/30/2012 6:44 AM  Cody [name]  has presented today for surgery, with the diagnosis of hidradentits   The various methods of treatment have been discussed with the patient and family. After consideration of risks, benefits and other options for treatment, the patient has consented to  Procedure(s): wide EXCISION HYDRADENITIS right buttock  (Right) as a surgical intervention .  [name] history has been reviewed, patient examined, no change in status, stable for surgery.  I have reviewed the patient's chart and labs.  Questions were answered to the patient's satisfaction.     Britteny Fiebelkorn A

## 2012-10-30 NOTE — Op Note (Signed)
wide EXCISION HYDRADENITIS right buttock   Procedure Note  Cody [name] 10/30/2012   Pre-op Diagnosis: hidradentits right buttock     Post-op Diagnosis: same  Procedure(s): wide EXCISION HYDRADENITIS right buttock (15cm)  Surgeon(s): Shelly Rubenstein, MD  Anesthesia: General  Staff:  Circulator: Aleen Sells, RN Scrub Person: Idell Pickles, CST  Estimated Blood Loss: Minimal               Specimens:   Indications: This is a 47 year old gentleman who has had wide excision of hidradenitis on his right buttock several years ago. He now has a chronic recurrent wound. The decision has been made to proceed with excision of this area  Procedure: The patient was brought to the operating room and identified as the correct patient. He was placed supine on the operating room table and general anesthesia was induced. The patient was then placed in the prone position. The right buttock was then prepped and draped in usual sterile fashion. Using a scalpel, I performed a 15 cm wide excision of the chronic draining sinus area. I took this down into the subcutaneous tissue with the electrocautery. I completed the wide excision with cautery. Specimen was then sent to pathology for evaluation. I thoroughly irrigated with saline. I anesthetized the area circumferentially with half percent Marcaine. I loosely reapproximated the subcutaneous tissue with interrupted 2-0 Vicryl sutures and closed the skin with interrupted 2-0 nylon sutures. Gauze and tape were applied.  The patient tolerated the procedure well. All the counts were correct at the end of the procedure. The patient was then extubated in the operating room and taken in a stable condition to the recovery room.          [name] A   Date: 10/30/2012  Time: 8:16 AM

## 2012-10-30 NOTE — H&P (Signed)
Cody [name] is an 47 y.o. male.   Chief Complaint: chronic hidradenitis with right buttock wound HPI: This gentleman has a history of chronic hidradenitis.  He has had wide excision of a chronic draining area on his right buttocks. This was approximately 2 years ago.  Past Medical History  Diagnosis Date  . Hyperlipidemia     takes Lipitor nightly  . 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 Stented coronary artery 2000, 2010  . Heart attack 2000  . Shortness of breath     with exertion  . Peripheral edema     takes Furosemide daily  . Pneumonia     hx of >65yrs ago  . Headache   . Arthritis   . Joint pain   . Joint swelling   . Bruises easily     takes Effient daily  . GERD (gastroesophageal reflux disease)     takes Prilosec prn  . Urinary urgency   . Nocturia   . Diabetes mellitus     takes Januvia,Metformin,and Glipizide daily  . ADD (attention deficit disorder with hyperactivity)   . Anxiety     takes Xanax bid  . Depression     takes Celexa daily  . ADD (attention deficit disorder with hyperactivity)     takes Adderall daily  . Sleep apnea     uses CPAP nightly  . Hypertension   . Coronary artery disease     last cath 1/14    Past Surgical History  Procedure Laterality Date  . Knee surgery  24 yrs. ago    right  . Application of wound vac  11/20/2010    and suture of bleeding post-op wound right buttock  . Rectal surgery  11/20/2010    exc. of infection perianal area right buttock (I & D)  . Ankle arthroscopy  05/13/2011    Procedure: ANKLE ARTHROSCOPY;  Surgeon: Loreta Ave, MD;  Location: [facility];  Service: Orthopedics;  Laterality: Right;  right ankle arthroscopy with extensive debridement synovectomy and chondroplasty  . Colonoscopy    . Total knee arthroplasty  10/13/2011    Procedure: TOTAL KNEE ARTHROPLASTY;  Surgeon: Loreta Ave, MD;  Location: [facility] OR;  Service: Orthopedics;  Laterality: Right;  DR MURPHY WANTS  90 MINUTES FOR THIS CASE  . Cardiac catheterization  12/05/2008    and in 2000  . Coronary angioplasty      2 stents  . Cardiac catheterization  1/14    no stent put in    Family History  Problem Relation Age of Onset  . Anesthesia problems Neg Hx   . Hypotension Neg Hx   . Malignant hyperthermia Neg Hx   . Pseudochol deficiency Neg Hx    Social History:  reports that he has been smoking Cigarettes.  He has a 25 pack-year smoking history. He has never used smokeless tobacco. He reports that he does not drink alcohol or use illicit drugs.  Allergies: No Known Allergies  Medications Prior to Admission  Medication Sig Dispense Refill  . ALPRAZolam (XANAX) 1 MG tablet Take 1 mg by mouth 3 (three) times daily as needed for anxiety.      Marland Kitchen amphetamine-dextroamphetamine (ADDERALL XR) 25 MG 24 hr capsule Take 25 mg by mouth every morning.       Marland Kitchen aspirin 81 MG tablet Take 81 mg by mouth daily.      Marland Kitchen atorvastatin (LIPITOR) 40 MG tablet  Take 40 mg by mouth daily.      . citalopram (CELEXA) 40 MG tablet Take 40 mg by mouth daily. PM      . doxycycline (VIBRA-TABS) 100 MG tablet TAKE 1 TABLET BY MOUTH TWICE A DAY FOR 3 WEEKS  42 tablet  1  . furosemide (LASIX) 40 MG tablet Take 40 mg by mouth daily.      Marland Kitchen glipiZIDE (GLUCOTROL) 10 MG tablet Take 10 mg by mouth 2 (two) times daily before a meal.       . metFORMIN (GLUCOPHAGE) 1000 MG tablet Take 2,500 mg by mouth daily. Pt take 1000mg  qam, 500mg  at lunch and 1000mg  at dinner      . omeprazole (PRILOSEC) 40 MG capsule Take 40 mg by mouth daily.      . potassium chloride SA (K-DUR,KLOR-CON) 20 MEQ tablet Take 20 mEq by mouth daily.      . ramipril (ALTACE) 10 MG capsule Take 10 mg by mouth daily.       . sitaGLIPtin (JANUVIA) 100 MG tablet Take 100 mg by mouth every morning.         No results found for this or any previous visit (from the past 48 hour(s)). No results found.  Review of Systems  All other systems reviewed and are  negative.    Height 5\' 6"  (1.676 m), weight 240 lb (108.863 kg). Physical Exam  Constitutional: He is oriented to person, place, and time. He appears well-developed and well-nourished. No distress.  HENT:  Head: Normocephalic and atraumatic.  Eyes: Conjunctivae are normal. Pupils are equal, round, and reactive to light.  Neck: Normal range of motion.  Cardiovascular: Normal rate, regular rhythm, normal heart sounds and intact distal pulses.   Respiratory: Effort normal and breath sounds normal. No respiratory distress.  GI: Soft. Bowel sounds are normal. There is no tenderness.  Musculoskeletal: Normal range of motion.  Neurological: He is alert and oriented to person, place, and time.  Skin: No rash noted. No erythema.  Chronic wound with large amount of scarring of the right buttock  Psychiatric: His behavior is normal.     Assessment/Plan Chronic hidradenitis of the right buttock  I will now proceed with another wide excision of this area. The risks were discussed in detail. These include but are not limited to bleeding, infection, recurrence, wound breakdown, need for further wound care, need for further surgery, et Karie Soda. He understands and wishes to proceed.  Javanna Patin A 10/30/2012, 6:40 AM   .

## 2012-10-30 NOTE — Anesthesia Preprocedure Evaluation (Addendum)
Anesthesia Evaluation  Patient identified by MRN, date of birth, ID band Patient awake    Reviewed: Allergy & Precautions, H&P , NPO status , Patient's Chart, lab work & pertinent test results  History of Anesthesia Complications Negative for: history of anesthetic complications  Airway Mallampati: II TM Distance: >3 FB Neck ROM: Full  Mouth opening: Limited Mouth Opening  Dental  (+) Teeth Intact, Missing and Dental Advisory Given   Pulmonary shortness of breath, sleep apnea and Continuous Positive Airway Pressure Ventilation , pneumonia -, Current Smoker,  breath sounds clear to auscultation        Cardiovascular hypertension, Pt. on medications + CAD and + Past MI Rhythm:Regular Rate:Normal     Neuro/Psych  Headaches, Anxiety Depression    GI/Hepatic   Endo/Other  diabetes, Well Controlled, Type 2, Oral Hypoglycemic AgentsMorbid obesity  Renal/GU      Musculoskeletal   Abdominal   Peds  Hematology   Anesthesia Other Findings   Reproductive/Obstetrics                        Anesthesia Physical Anesthesia Plan  ASA: III  Anesthesia Plan: General   Post-op Pain Management:    Induction: Intravenous  Airway Management Planned: Oral ETT and Video Laryngoscope Planned  Additional Equipment:   Intra-op Plan:   Post-operative Plan: Extubation in OR  Informed Consent: I have reviewed the patients History and Physical, chart, labs and discussed the procedure including the risks, benefits and alternatives for the proposed anesthesia with the patient or authorized representative who has indicated his/her understanding and acceptance.   Dental advisory given  Plan Discussed with: CRNA, Anesthesiologist and Surgeon  Anesthesia Plan Comments:         Anesthesia Quick Evaluation

## 2012-10-30 NOTE — Anesthesia Procedure Notes (Signed)
Procedure Name: Intubation Date/Time: 10/30/2012 8:43 AM Performed by: Burna Cash Pre-anesthesia Checklist: Patient identified, Emergency Drugs available, Suction available and Patient being monitored Patient Re-evaluated:Patient Re-evaluated prior to inductionOxygen Delivery Method: Circle System Utilized Preoxygenation: Pre-oxygenation with 100% oxygen Intubation Type: IV induction Ventilation: Mask ventilation without difficulty Laryngoscope Size: Mac and 3 Grade View: Grade I Tube type: Oral Tube size: 8.0 mm Number of attempts: 1 Airway Equipment and Method: stylet and Video-laryngoscopy Placement Confirmation: ETT inserted through vocal cords under direct vision,  positive ETCO2 and breath sounds checked- equal and bilateral Secured at: 22 cm Tube secured with: Tape Dental Injury: Teeth and Oropharynx as per pre-operative assessment

## 2012-10-30 NOTE — Transfer of Care (Signed)
Immediate Anesthesia Transfer of Care Note  Patient: Cody [name]  Procedure(s) Performed: Procedure(s): wide EXCISION HYDRADENITIS right buttock  (Right)  Patient Location: PACU  Anesthesia Type:General  Level of Consciousness: awake, alert  and oriented  Airway & Oxygen Therapy: Patient Spontanous Breathing and Patient connected to face mask oxygen  Post-op Assessment: Report given to PACU RN and Post -op Vital signs reviewed and stable  Post vital signs: Reviewed and stable  Complications: No apparent anesthesia complications

## 2012-10-30 NOTE — Anesthesia Postprocedure Evaluation (Signed)
  Anesthesia Post-op Note  Patient: Cody [name]  Procedure(s) Performed: Procedure(s): wide EXCISION HYDRADENITIS right buttock  (Right)  Patient Location: PACU  Anesthesia Type:General  Level of Consciousness: awake, alert  and oriented  Airway and Oxygen Therapy: Patient Spontanous Breathing and Patient connected to face mask oxygen  Post-op Pain: mild  Post-op Assessment: Post-op Vital signs reviewed  Post-op Vital Signs: Reviewed  Complications: No apparent anesthesia complications

## 2012-10-31 ENCOUNTER — Encounter (HOSPITAL_BASED_OUTPATIENT_CLINIC_OR_DEPARTMENT_OTHER): Payer: Self-pay | Admitting: Surgery

## 2012-10-31 ENCOUNTER — Telehealth (INDEPENDENT_AMBULATORY_CARE_PROVIDER_SITE_OTHER): Payer: Self-pay | Admitting: *Deleted

## 2012-10-31 ENCOUNTER — Emergency Department (HOSPITAL_COMMUNITY)
Admission: EM | Admit: 2012-10-31 | Discharge: 2012-10-31 | Disposition: A | Discharge status: Home / Self Care | Payer: Medicaid Other | Attending: Emergency Medicine | Admitting: Emergency Medicine

## 2012-10-31 DIAGNOSIS — Z8701 Personal history of pneumonia (recurrent): Secondary | ICD-10-CM | POA: Insufficient documentation

## 2012-10-31 DIAGNOSIS — T888XXA Other specified complications of surgical and medical care, not elsewhere classified, initial encounter: Secondary | ICD-10-CM

## 2012-10-31 DIAGNOSIS — I252 Old myocardial infarction: Secondary | ICD-10-CM | POA: Insufficient documentation

## 2012-10-31 DIAGNOSIS — Z87828 Personal history of other (healed) physical injury and trauma: Secondary | ICD-10-CM | POA: Insufficient documentation

## 2012-10-31 DIAGNOSIS — Z8739 Personal history of other diseases of the musculoskeletal system and connective tissue: Secondary | ICD-10-CM | POA: Insufficient documentation

## 2012-10-31 DIAGNOSIS — Z7982 Long term (current) use of aspirin: Secondary | ICD-10-CM | POA: Insufficient documentation

## 2012-10-31 DIAGNOSIS — F909 Attention-deficit hyperactivity disorder, unspecified type: Secondary | ICD-10-CM | POA: Insufficient documentation

## 2012-10-31 DIAGNOSIS — I2510 Atherosclerotic heart disease of native coronary artery without angina pectoris: Secondary | ICD-10-CM | POA: Insufficient documentation

## 2012-10-31 DIAGNOSIS — E785 Hyperlipidemia, unspecified: Secondary | ICD-10-CM | POA: Insufficient documentation

## 2012-10-31 DIAGNOSIS — K219 Gastro-esophageal reflux disease without esophagitis: Secondary | ICD-10-CM | POA: Insufficient documentation

## 2012-10-31 DIAGNOSIS — F411 Generalized anxiety disorder: Secondary | ICD-10-CM | POA: Insufficient documentation

## 2012-10-31 DIAGNOSIS — Z792 Long term (current) use of antibiotics: Secondary | ICD-10-CM | POA: Insufficient documentation

## 2012-10-31 DIAGNOSIS — Y838 Other surgical procedures as the cause of abnormal reaction of the patient, or of later complication, without mention of misadventure at the time of the procedure: Secondary | ICD-10-CM | POA: Insufficient documentation

## 2012-10-31 DIAGNOSIS — Z8669 Personal history of other diseases of the nervous system and sense organs: Secondary | ICD-10-CM | POA: Insufficient documentation

## 2012-10-31 DIAGNOSIS — F3289 Other specified depressive episodes: Secondary | ICD-10-CM | POA: Insufficient documentation

## 2012-10-31 DIAGNOSIS — Z79899 Other long term (current) drug therapy: Secondary | ICD-10-CM | POA: Insufficient documentation

## 2012-10-31 DIAGNOSIS — IMO0002 Reserved for concepts with insufficient information to code with codable children: Secondary | ICD-10-CM | POA: Insufficient documentation

## 2012-10-31 DIAGNOSIS — Z9861 Coronary angioplasty status: Secondary | ICD-10-CM | POA: Insufficient documentation

## 2012-10-31 DIAGNOSIS — E119 Type 2 diabetes mellitus without complications: Secondary | ICD-10-CM | POA: Insufficient documentation

## 2012-10-31 DIAGNOSIS — Z9981 Dependence on supplemental oxygen: Secondary | ICD-10-CM | POA: Insufficient documentation

## 2012-10-31 DIAGNOSIS — F17200 Nicotine dependence, unspecified, uncomplicated: Secondary | ICD-10-CM | POA: Insufficient documentation

## 2012-10-31 DIAGNOSIS — M129 Arthropathy, unspecified: Secondary | ICD-10-CM | POA: Insufficient documentation

## 2012-10-31 DIAGNOSIS — F329 Major depressive disorder, single episode, unspecified: Secondary | ICD-10-CM | POA: Insufficient documentation

## 2012-10-31 DIAGNOSIS — G4730 Sleep apnea, unspecified: Secondary | ICD-10-CM | POA: Insufficient documentation

## 2012-10-31 LAB — BASIC METABOLIC PANEL
BUN: 10 mg/dL (ref 6–23)
Calcium: 8.8 mg/dL (ref 8.4–10.5)
Chloride: 102 mEq/L (ref 96–112)
Creatinine, Ser: 0.85 mg/dL (ref 0.50–1.35)
GFR calc Af Amer: >90 mL/min (ref >90)

## 2012-10-31 LAB — CBC WITH DIFFERENTIAL/PLATELET
Basophils Relative: 0 % (ref 0–1)
Eosinophils Absolute: 0.1 10*3/uL (ref 0.0–0.7)
Eosinophils Relative: 1 % (ref 0–5)
HCT: 39.6 % (ref 39.0–52.0)
Hemoglobin: 13.4 g/dL (ref 13.0–17.0)
MCH: 29.3 pg (ref 26.0–34.0)
MCHC: 33.8 g/dL (ref 30.0–36.0)
MCV: 86.5 fL (ref 78.0–100.0)
Monocytes Absolute: 0.9 10*3/uL (ref 0.1–1.0)
Monocytes Relative: 8 % (ref 3–12)
Neutro Abs: 5.7 10*3/uL (ref 1.7–7.7)

## 2012-10-31 LAB — SAMPLE TO BLOOD BANK

## 2012-10-31 MED ORDER — FENTANYL CITRATE 0.05 MG/ML IJ SOLN
50.0000 ug | Freq: Once | INTRAMUSCULAR | Status: AC
  Administered 2012-10-31: 50 ug via INTRAVENOUS
  Filled 2012-10-31: qty 2

## 2012-10-31 MED ORDER — SODIUM CHLORIDE 0.9 % IV BOLUS (SEPSIS)
1000.0000 mL | Freq: Once | INTRAVENOUS | Status: AC
  Administered 2012-10-31: 1000 mL via INTRAVENOUS

## 2012-10-31 MED ORDER — SODIUM CHLORIDE 0.9 % IV SOLN
INTRAVENOUS | Status: DC
  Administered 2012-10-31: 20:00:00 via INTRAVENOUS

## 2012-10-31 MED ORDER — SODIUM CHLORIDE 0.9 % IV BOLUS (SEPSIS): 1000.0000 mL | Freq: Once | INTRAVENOUS | Status: DC

## 2012-10-31 NOTE — Telephone Encounter (Signed)
Patient called to report excessive bleeding from his incision site.  Patient states he has bleed through 3 gauze and his pants.  Spoke to [place] who agreed with the severe amount of bleeding patient should be examined in the ED.  Patient states understanding and agreeable at this time.

## 2012-10-31 NOTE — ED Notes (Signed)
Pt in c/o increased bleeding from surgical site, pt had a rectal cyst removed yesterday and this afternoon he noted bleeding though his pants, states he has not had bleeding at that level since the procedure. Was told by on call RN to come to hospital. No changes in pain since procedure.

## 2012-10-31 NOTE — ED Provider Notes (Signed)
History     CSN: [id number]  Arrival date & time 10/31/12  1707   First MD Initiated Contact with Patient 10/31/12 2000      Chief Complaint  Patient presents with  . Post-op Problem    (Consider location/radiation/quality/duration/timing/severity/associated sxs/prior treatment) HPI Patient here with bleeding from a week postoperative wound on his right buttock. Patient had a return hidradenitis was incised yesterday in the operating room. Since that time he has noted increased drainage as well as pain. Denies any fever. Symptoms have been persistent and worse with standing. Called his doctor and was told to come in for further evaluation Past Medical History  Diagnosis Date  . Hyperlipidemia     takes Lipitor nightly  . 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 Stented coronary artery 2000, 2010  . Heart attack 2000  . Shortness of breath     with exertion  . Peripheral edema     takes Furosemide daily  . Pneumonia     hx of >23yrs ago  . Headache   . Arthritis   . Joint pain   . Joint swelling   . Bruises easily     takes Effient daily  . GERD (gastroesophageal reflux disease)     takes Prilosec prn  . Urinary urgency   . Nocturia   . Diabetes mellitus     takes Januvia,Metformin,and Glipizide daily  . ADD (attention deficit disorder with hyperactivity)   . Anxiety     takes Xanax bid  . Depression     takes Celexa daily  . ADD (attention deficit disorder with hyperactivity)     takes Adderall daily  . Sleep apnea     uses CPAP nightly  . Hypertension   . Coronary artery disease     last cath 1/14    Past Surgical History  Procedure Laterality Date  . Knee surgery  24 yrs. ago    right  . Application of wound vac  11/20/2010    and suture of bleeding post-op wound right buttock  . Rectal surgery  11/20/2010    exc. of infection perianal area right buttock (I & D)  . Ankle arthroscopy  05/13/2011    Procedure: ANKLE ARTHROSCOPY;   Surgeon: Loreta Ave, MD;  Location: [facility];  Service: Orthopedics;  Laterality: Right;  right ankle arthroscopy with extensive debridement synovectomy and chondroplasty  . Colonoscopy    . Total knee arthroplasty  10/13/2011    Procedure: TOTAL KNEE ARTHROPLASTY;  Surgeon: Loreta Ave, MD;  Location: [facility] OR;  Service: Orthopedics;  Laterality: Right;  DR MURPHY WANTS 90 MINUTES FOR THIS CASE  . Cardiac catheterization  12/05/2008    and in 2000  . Coronary angioplasty      2 stents  . Cardiac catheterization  1/14    no stent put in  . Hydradenitis excision Right 10/30/2012    Procedure: wide EXCISION HYDRADENITIS right buttock ;  Surgeon: Shelly Rubenstein, MD;  Location:  SURGERY CENTER;  Service: General;  Laterality: Right;    Family History  Problem Relation Age of Onset  . Anesthesia problems Neg Hx   . Hypotension Neg Hx   . Malignant hyperthermia Neg Hx   . Pseudochol deficiency Neg Hx     History  Substance Use Topics  . Smoking status: Current Every Day Smoker -- 1.00 packs/day for 25 years    Types: Cigarettes  .  Smokeless tobacco: Never Used  . Alcohol Use: No      Review of Systems  All other systems reviewed and are negative.    Allergies  Review of patient's allergies indicates no known allergies.  Home Medications   Current Outpatient Rx  Name  Route  Sig  Dispense  Refill  . ALPRAZolam (XANAX) 1 MG tablet   Oral   Take 1 mg by mouth 3 (three) times daily as needed for anxiety.         Marland Kitchen amphetamine-dextroamphetamine (ADDERALL XR) 25 MG 24 hr capsule   Oral   Take 25 mg by mouth every morning.          Marland Kitchen aspirin 81 MG chewable tablet   Oral   Chew 81 mg by mouth daily.         Marland Kitchen atorvastatin (LIPITOR) 40 MG tablet   Oral   Take 40 mg by mouth daily.         . calcium carbonate (OS-CAL - DOSED IN MG OF ELEMENTAL CALCIUM) 1250 MG tablet   Oral   Take 1 tablet by mouth daily.         . citalopram  (CELEXA) 40 MG tablet   Oral   Take 40 mg by mouth daily. PM         . doxycycline (VIBRA-TABS) 100 MG tablet   Oral   Take 1 tablet (100 mg total) by mouth 2 (two) times daily.   14 tablet   2   . furosemide (LASIX) 40 MG tablet   Oral   Take 40 mg by mouth daily.         Marland Kitchen glipiZIDE (GLUCOTROL) 10 MG tablet   Oral   Take 10 mg by mouth 2 (two) times daily before a meal.          . metFORMIN (GLUCOPHAGE) 1000 MG tablet   Oral   Take 500-1,000 mg by mouth 3 (three) times daily. Pt take 1000mg  qam, 500mg  at lunch and 1000mg  at dinner         . omeprazole (PRILOSEC) 40 MG capsule   Oral   Take 40 mg by mouth daily.         Marland Kitchen oxyCODONE-acetaminophen (ROXICET) 5-325 MG per tablet   Oral   Take 2 tablets by mouth every 4 (four) hours as needed for pain.   50 tablet   0   . potassium chloride SA (K-DUR,KLOR-CON) 20 MEQ tablet   Oral   Take 20 mEq by mouth daily.         . ramipril (ALTACE) 10 MG capsule   Oral   Take 10 mg by mouth daily.          . sitaGLIPtin (JANUVIA) 100 MG tablet   Oral   Take 100 mg by mouth every morning.            BP 130/84  Pulse 67  Temp(Src) 98.2 F (36.8 C) (Oral)  Resp 20  SpO2 100%  Physical Exam  Nursing note and vitals reviewed. Constitutional: He is oriented to person, place, and time. He appears well-developed and well-nourished.  Non-toxic appearance. No distress.  HENT:  Head: Normocephalic and atraumatic.  Eyes: Conjunctivae, EOM and lids are normal. Pupils are equal, round, and reactive to light.  Neck: Normal range of motion. Neck supple. No tracheal deviation present. No mass present.  Cardiovascular: Normal rate, regular rhythm and normal heart sounds.  Exam reveals no gallop.   No  murmur heard. Pulmonary/Chest: Effort normal and breath sounds normal. No stridor. No respiratory distress. He has no decreased breath sounds. He has no wheezes. He has no rhonchi. He has no rales.  Abdominal: Soft. Normal  appearance and bowel sounds are normal. He exhibits no distension. There is no tenderness. There is no rebound and no CVA tenderness.  Genitourinary:     Musculoskeletal: Normal range of motion. He exhibits no edema and no tenderness.  Neurological: He is alert and oriented to person, place, and time. He has normal strength. No cranial nerve deficit or sensory deficit. GCS eye subscore is 4. GCS verbal subscore is 5. GCS motor subscore is 6.  Skin: Skin is warm and dry. No abrasion and no rash noted.  Psychiatric: He has a normal mood and affect. His speech is normal and behavior is normal.    ED Course  Procedures (including critical care time)  Labs Reviewed  CBC WITH DIFFERENTIAL  BASIC METABOLIC PANEL  SAMPLE TO BLOOD BANK   No results found.   No diagnosis found.    MDM  Patient given IV fluids and food towards low blood sugar. Also given pain medication as well. Reexamined and is much better at this time. We'll followup with the surgeon as needed        Toy Baker, MD 10/31/12 2238

## 2012-11-01 ENCOUNTER — Telehealth (INDEPENDENT_AMBULATORY_CARE_PROVIDER_SITE_OTHER): Payer: Self-pay

## 2012-11-01 NOTE — Telephone Encounter (Signed)
Patient called to ask if we received report from his ER visit yesterday. I told him it is in our system.

## 2012-11-08 ENCOUNTER — Ambulatory Visit (INDEPENDENT_AMBULATORY_CARE_PROVIDER_SITE_OTHER): Payer: Medicaid Other | Admitting: Surgery

## 2012-11-08 ENCOUNTER — Encounter (INDEPENDENT_AMBULATORY_CARE_PROVIDER_SITE_OTHER): Payer: Self-pay | Admitting: Surgery

## 2012-11-08 VITALS — BP 110/72 | HR 78 | Temp 97.4°F | Resp 18 | Ht 67.0 in | Wt 240.6 lb

## 2012-11-08 DIAGNOSIS — Z9889 Other specified postprocedural states: Secondary | ICD-10-CM

## 2012-11-08 NOTE — Progress Notes (Signed)
Subjective:     Patient ID: Cody [name], male   DOB: 09-20-1965, 47 y.o.   MRN: [id number]  HPI He is here for his first postop visit status post excision of a chronic wound from hidradenitis on his buttock. He had some drainage and presented to the emergency department. He now No longer has any  drainage. He has finished his antibiotics  Review of Systems     Objective:   Physical Exam On exam, the sutures are still intact. There is no evidence of infection. The sutures are not ready to come out yet    Assessment:     Patient stable postop     Plan:     I will see him back next week to remove the sutures

## 2012-11-14 ENCOUNTER — Emergency Department (HOSPITAL_BASED_OUTPATIENT_CLINIC_OR_DEPARTMENT_OTHER)
Admission: EM | Admit: 2012-11-14 | Discharge: 2012-11-14 | Disposition: A | Discharge status: Home Health | Payer: Medicaid Other | Attending: Emergency Medicine | Admitting: Emergency Medicine

## 2012-11-14 ENCOUNTER — Ambulatory Visit (INDEPENDENT_AMBULATORY_CARE_PROVIDER_SITE_OTHER): Payer: Medicaid Other | Admitting: Surgery

## 2012-11-14 ENCOUNTER — Encounter (HOSPITAL_BASED_OUTPATIENT_CLINIC_OR_DEPARTMENT_OTHER): Payer: Self-pay | Admitting: *Deleted

## 2012-11-14 ENCOUNTER — Encounter (INDEPENDENT_AMBULATORY_CARE_PROVIDER_SITE_OTHER): Payer: Self-pay | Admitting: Surgery

## 2012-11-14 ENCOUNTER — Telehealth (INDEPENDENT_AMBULATORY_CARE_PROVIDER_SITE_OTHER): Payer: Self-pay

## 2012-11-14 VITALS — BP 125/70 | HR 76 | Temp 97.6°F | Resp 17 | Ht 66.0 in | Wt 246.2 lb

## 2012-11-14 DIAGNOSIS — Z8674 Personal history of sudden cardiac arrest: Secondary | ICD-10-CM | POA: Insufficient documentation

## 2012-11-14 DIAGNOSIS — Z7982 Long term (current) use of aspirin: Secondary | ICD-10-CM | POA: Insufficient documentation

## 2012-11-14 DIAGNOSIS — G4730 Sleep apnea, unspecified: Secondary | ICD-10-CM | POA: Insufficient documentation

## 2012-11-14 DIAGNOSIS — T8131XA Disruption of external operation (surgical) wound, not elsewhere classified, initial encounter: Secondary | ICD-10-CM | POA: Insufficient documentation

## 2012-11-14 DIAGNOSIS — I2510 Atherosclerotic heart disease of native coronary artery without angina pectoris: Secondary | ICD-10-CM | POA: Insufficient documentation

## 2012-11-14 DIAGNOSIS — M129 Arthropathy, unspecified: Secondary | ICD-10-CM | POA: Insufficient documentation

## 2012-11-14 DIAGNOSIS — F329 Major depressive disorder, single episode, unspecified: Secondary | ICD-10-CM | POA: Insufficient documentation

## 2012-11-14 DIAGNOSIS — Z9889 Other specified postprocedural states: Secondary | ICD-10-CM

## 2012-11-14 DIAGNOSIS — E785 Hyperlipidemia, unspecified: Secondary | ICD-10-CM | POA: Insufficient documentation

## 2012-11-14 DIAGNOSIS — F3289 Other specified depressive episodes: Secondary | ICD-10-CM | POA: Insufficient documentation

## 2012-11-14 DIAGNOSIS — Z8701 Personal history of pneumonia (recurrent): Secondary | ICD-10-CM | POA: Insufficient documentation

## 2012-11-14 DIAGNOSIS — Z87828 Personal history of other (healed) physical injury and trauma: Secondary | ICD-10-CM | POA: Insufficient documentation

## 2012-11-14 DIAGNOSIS — E119 Type 2 diabetes mellitus without complications: Secondary | ICD-10-CM | POA: Insufficient documentation

## 2012-11-14 DIAGNOSIS — F411 Generalized anxiety disorder: Secondary | ICD-10-CM | POA: Insufficient documentation

## 2012-11-14 DIAGNOSIS — K219 Gastro-esophageal reflux disease without esophagitis: Secondary | ICD-10-CM | POA: Insufficient documentation

## 2012-11-14 DIAGNOSIS — Z9861 Coronary angioplasty status: Secondary | ICD-10-CM | POA: Insufficient documentation

## 2012-11-14 DIAGNOSIS — I10 Essential (primary) hypertension: Secondary | ICD-10-CM | POA: Insufficient documentation

## 2012-11-14 DIAGNOSIS — F17200 Nicotine dependence, unspecified, uncomplicated: Secondary | ICD-10-CM | POA: Insufficient documentation

## 2012-11-14 DIAGNOSIS — Y838 Other surgical procedures as the cause of abnormal reaction of the patient, or of later complication, without mention of misadventure at the time of the procedure: Secondary | ICD-10-CM | POA: Insufficient documentation

## 2012-11-14 DIAGNOSIS — Z79899 Other long term (current) drug therapy: Secondary | ICD-10-CM | POA: Insufficient documentation

## 2012-11-14 DIAGNOSIS — F909 Attention-deficit hyperactivity disorder, unspecified type: Secondary | ICD-10-CM | POA: Insufficient documentation

## 2012-11-14 LAB — COMPREHENSIVE METABOLIC PANEL
Alkaline Phosphatase: 61 U/L (ref 39–117)
BUN: 8 mg/dL (ref 6–23)
CO2: 26 mEq/L (ref 19–32)
Chloride: 99 mEq/L (ref 96–112)
GFR calc Af Amer: >90 mL/min (ref >90)
GFR calc non Af Amer: >90 mL/min (ref >90)
Glucose, Bld: 195 mg/dL — ABNORMAL HIGH (ref 70–99)
Potassium: 4.3 mEq/L (ref 3.5–5.1)
Total Bilirubin: 0.2 mg/dL — ABNORMAL LOW (ref 0.3–1.2)
Total Protein: 6 g/dL (ref 6.0–8.3)

## 2012-11-14 LAB — CBC WITH DIFFERENTIAL/PLATELET
Eosinophils Absolute: 0.1 10*3/uL (ref 0.0–0.7)
Hemoglobin: 12.8 g/dL — ABNORMAL LOW (ref 13.0–17.0)
Lymphs Abs: 2.5 10*3/uL (ref 0.7–4.0)
MCH: 29.7 pg (ref 26.0–34.0)
Monocytes Relative: 9 % (ref 3–12)
Neutrophils Relative %: 56 % (ref 43–77)
RBC: 4.31 MIL/uL (ref 4.22–5.81)

## 2012-11-14 MED ORDER — AMOXICILLIN-POT CLAVULANATE 500-125 MG PO TABS: 1.0000 | ORAL_TABLET | Freq: Three times a day (TID) | ORAL | Status: DC

## 2012-11-14 MED ORDER — MORPHINE SULFATE 4 MG/ML IJ SOLN
4.0000 mg | Freq: Once | INTRAMUSCULAR | Status: AC
  Administered 2012-11-14: 4 mg via INTRAVENOUS
  Filled 2012-11-14: qty 1

## 2012-11-14 MED ORDER — OXYCODONE-ACETAMINOPHEN 5-325 MG PO TABS: 2.0000 | ORAL_TABLET | ORAL | Status: DC | PRN

## 2012-11-14 MED ORDER — SODIUM CHLORIDE 0.9 % IV SOLN
3.0000 g | Freq: Once | INTRAVENOUS | Status: AC
  Administered 2012-11-14: 3 g via INTRAVENOUS
  Filled 2012-11-14: qty 3

## 2012-11-14 NOTE — ED Provider Notes (Signed)
History     CSN: [id number]  Arrival date & time 11/14/12  1649   First MD Initiated Contact with Patient 11/14/12 1704      Chief Complaint  Patient presents with  . Wound Check    (Consider location/radiation/quality/duration/timing/severity/associated sxs/prior treatment) HPI Comments: Patient underwent buttock cyst removal surgery 3 1/2 weeks ago by Dr. Magnus Ivan.  Went for follow up today.  Sutures were removed and everything seemed okay.  Shortly after arriving home, the wound opened up and began bleeding.  His wife reports that he is in severe pain.  No fevers or chills.    The history is provided by the patient.    Past Medical History  Diagnosis Date  . Hyperlipidemia     takes Lipitor nightly  . 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 Stented coronary artery 2000, 2010  . Heart attack 2000  . Shortness of breath     with exertion  . Peripheral edema     takes Furosemide daily  . Pneumonia     hx of >19yrs ago  . Headache(784.0)   . Arthritis   . Joint pain   . Joint swelling   . Bruises easily     takes Effient daily  . GERD (gastroesophageal reflux disease)     takes Prilosec prn  . Urinary urgency   . Nocturia   . Diabetes mellitus     takes Januvia,Metformin,and Glipizide daily  . ADD (attention deficit disorder with hyperactivity)   . Anxiety     takes Xanax bid  . Depression     takes Celexa daily  . ADD (attention deficit disorder with hyperactivity)     takes Adderall daily  . Sleep apnea     uses CPAP nightly  . Hypertension   . Coronary artery disease     last cath 1/14    Past Surgical History  Procedure Laterality Date  . Knee surgery  24 yrs. ago    right  . Application of wound vac  11/20/2010    and suture of bleeding post-op wound right buttock  . Rectal surgery  11/20/2010    exc. of infection perianal area right buttock (I & D)  . Ankle arthroscopy  05/13/2011    Procedure: ANKLE ARTHROSCOPY;  Surgeon:  Loreta Ave, MD;  Location: [facility];  Service: Orthopedics;  Laterality: Right;  right ankle arthroscopy with extensive debridement synovectomy and chondroplasty  . Colonoscopy    . Total knee arthroplasty  10/13/2011    Procedure: TOTAL KNEE ARTHROPLASTY;  Surgeon: Loreta Ave, MD;  Location: [facility] OR;  Service: Orthopedics;  Laterality: Right;  DR MURPHY WANTS 90 MINUTES FOR THIS CASE  . Cardiac catheterization  12/05/2008    and in 2000  . Coronary angioplasty      2 stents  . Cardiac catheterization  1/14    no stent put in  . Hydradenitis excision Right 10/30/2012    Procedure: wide EXCISION HYDRADENITIS right buttock ;  Surgeon: Shelly Rubenstein, MD;  Location: [facility];  Service: General;  Laterality: Right;    Family History  Problem Relation Age of Onset  . Anesthesia problems Neg Hx   . Hypotension Neg Hx   . Malignant hyperthermia Neg Hx   . Pseudochol deficiency Neg Hx     History  Substance Use Topics  . Smoking status: Current Every Day Smoker -- 1.00 packs/day for 25  years    Types: Cigarettes  . Smokeless tobacco: Never Used  . Alcohol Use: No      Review of Systems  All other systems reviewed and are negative.    Allergies  Review of patient's allergies indicates no known allergies.  Home Medications   Current Outpatient Rx  Name  Route  Sig  Dispense  Refill  . ALPRAZolam (XANAX) 1 MG tablet   Oral   Take 1 mg by mouth 3 (three) times daily as needed for anxiety.         Marland Kitchen amphetamine-dextroamphetamine (ADDERALL XR) 25 MG 24 hr capsule   Oral   Take 25 mg by mouth every morning.          Marland Kitchen aspirin 81 MG chewable tablet   Oral   Chew 81 mg by mouth daily.         Marland Kitchen atorvastatin (LIPITOR) 40 MG tablet   Oral   Take 40 mg by mouth daily.         . calcium carbonate (OS-CAL - DOSED IN MG OF ELEMENTAL CALCIUM) 1250 MG tablet   Oral   Take 1 tablet by mouth daily.         . citalopram (CELEXA)  40 MG tablet   Oral   Take 40 mg by mouth daily. PM         . furosemide (LASIX) 40 MG tablet   Oral   Take 40 mg by mouth daily.         Marland Kitchen glipiZIDE (GLUCOTROL) 10 MG tablet   Oral   Take 10 mg by mouth 2 (two) times daily before a meal.          . metFORMIN (GLUCOPHAGE) 1000 MG tablet   Oral   Take 500-1,000 mg by mouth 3 (three) times daily. Pt take 1000mg  qam, 500mg  at lunch and 1000mg  at dinner         . omeprazole (PRILOSEC) 40 MG capsule   Oral   Take 40 mg by mouth daily.         Marland Kitchen oxyCODONE-acetaminophen (ROXICET) 5-325 MG per tablet   Oral   Take 2 tablets by mouth every 4 (four) hours as needed for pain.   50 tablet   0   . potassium chloride SA (K-DUR,KLOR-CON) 20 MEQ tablet   Oral   Take 20 mEq by mouth daily.         . ramipril (ALTACE) 10 MG capsule   Oral   Take 10 mg by mouth daily.          . sitaGLIPtin (JANUVIA) 100 MG tablet   Oral   Take 100 mg by mouth every morning.            BP 105/52  Pulse 74  Temp(Src) 98.5 F (36.9 C) (Oral)  Resp 18  Ht 5\' 7"  (1.702 m)  Wt 245 lb (111.131 kg)  BMI 38.36 kg/m2  SpO2 98%  Physical Exam  Nursing note and vitals reviewed. Constitutional: He is oriented to person, place, and time. He appears well-developed and well-nourished. No distress.  HENT:  Head: Normocephalic and atraumatic.  Mouth/Throat: Oropharynx is clear and moist.  Neck: Normal range of motion. Neck supple.  Musculoskeletal: Normal range of motion. He exhibits no edema.  Neurological: He is alert and oriented to person, place, and time.  Skin: Skin is warm and dry. He is not diaphoretic.  There is a large incision to the right buttock measuring approximately  10 cm.  The wound edges of the central 3 cm are noted to have dehisced with serous drainage.  There is no active bleeding.  There is mild surrounding erythema.     ED Course  Procedures (including critical care time)  Labs Reviewed  CBC WITH DIFFERENTIAL   COMPREHENSIVE METABOLIC PANEL   No results found.   No diagnosis found.    MDM  There appears to be a wound dehisence.  He was given iv antibiotics and with be started on augmentin.  The wound was dressed with a wet-to-dry dressing and his pain has been addressed.  To call Dr. Magnus Ivan tomorrow to arrange a follow up in the next two days.          Geoffery Lyons, MD 11/14/12 [phone]

## 2012-11-14 NOTE — Progress Notes (Signed)
Subjective:     Patient ID: Cody [name], male   DOB: 1966/05/12, 47 y.o.   MRN: [id number]  HPI He is here for another visit to remove the rest of his sutures. He reports minimal drainage   Review of Systems     Objective:   Physical Exam On exam, there still some open areas. I went ahead and removed all the sutures. There is no evidence of infection    Assessment:     Patient stable postop     Plan:     He may resume his normal activity. I will see him back at the end of June

## 2012-11-14 NOTE — ED Notes (Signed)
Pt reports stitches removed today in office , wound opened up x 2 hrs ago

## 2012-11-14 NOTE — Telephone Encounter (Signed)
Lupita Leash called stating husbands incision opened up about the size of a quarter after his appointment today and is bleeding heavily. She states blood is all over his clothes and the bathroom floor and she taped a maxi pad over wound and she thinks he will fill pad within 30 minutes. I advised her to take him to ED since he is bleeding that heavy and since he is on blood thinner he should be evaluated in ED. She agreed.

## 2012-11-15 ENCOUNTER — Telehealth (INDEPENDENT_AMBULATORY_CARE_PROVIDER_SITE_OTHER): Payer: Self-pay | Admitting: General Surgery

## 2012-11-15 NOTE — Telephone Encounter (Signed)
[facility] for patient to call back and ask for Pattricia Boss, he needs a apt to come in on Friday to see Dr Magnus Ivan 11-17-12

## 2012-11-17 ENCOUNTER — Ambulatory Visit (INDEPENDENT_AMBULATORY_CARE_PROVIDER_SITE_OTHER): Payer: Medicaid Other | Admitting: Surgery

## 2012-11-17 ENCOUNTER — Encounter (INDEPENDENT_AMBULATORY_CARE_PROVIDER_SITE_OTHER): Payer: Self-pay | Admitting: Surgery

## 2012-11-17 VITALS — BP 128/78 | HR 82 | Temp 97.9°F | Resp 18 | Ht 66.0 in | Wt 242.6 lb

## 2012-11-17 DIAGNOSIS — Z9889 Other specified postprocedural states: Secondary | ICD-10-CM

## 2012-11-17 NOTE — Progress Notes (Signed)
Subjective:     Patient ID: Cody [name], male   DOB: [birth year], 47 y.o.   MRN: [id number]  HPI He is here for a wound check. He had central breakdown of the right buttock wound after suture removal. He is now undergoing wet-to-dry dressing changes  Review of Systems     Objective:   Physical Exam On exam, there is a 4 cm x 2 cm open area. I cleaned the wound and treated with silver nitrate    Assessment:     Patient stable postop with open wound     Plan:     He will continue twice a day wet to dry dressing changes and I will see him back in 3 weeks

## 2012-11-21 ENCOUNTER — Encounter: Payer: Self-pay | Admitting: Pulmonary Disease

## 2012-11-28 ENCOUNTER — Ambulatory Visit: Payer: Medicaid Other | Admitting: Pulmonary Disease

## 2012-12-06 ENCOUNTER — Emergency Department (HOSPITAL_COMMUNITY): Payer: Medicaid Other

## 2012-12-06 ENCOUNTER — Observation Stay (HOSPITAL_COMMUNITY)
Admission: EM | Admit: 2012-12-06 | Discharge: 2012-12-08 | Disposition: A | Discharge status: Home / Self Care | Payer: Medicaid Other | Attending: Cardiology | Admitting: Cardiology

## 2012-12-06 ENCOUNTER — Other Ambulatory Visit: Payer: Self-pay

## 2012-12-06 ENCOUNTER — Encounter (HOSPITAL_COMMUNITY): Payer: Self-pay | Admitting: *Deleted

## 2012-12-06 DIAGNOSIS — Z9861 Coronary angioplasty status: Secondary | ICD-10-CM | POA: Insufficient documentation

## 2012-12-06 DIAGNOSIS — E119 Type 2 diabetes mellitus without complications: Secondary | ICD-10-CM | POA: Insufficient documentation

## 2012-12-06 DIAGNOSIS — R079 Chest pain, unspecified: Secondary | ICD-10-CM | POA: Insufficient documentation

## 2012-12-06 DIAGNOSIS — Z23 Encounter for immunization: Secondary | ICD-10-CM | POA: Insufficient documentation

## 2012-12-06 DIAGNOSIS — Z79899 Other long term (current) drug therapy: Secondary | ICD-10-CM | POA: Insufficient documentation

## 2012-12-06 DIAGNOSIS — I10 Essential (primary) hypertension: Secondary | ICD-10-CM | POA: Insufficient documentation

## 2012-12-06 DIAGNOSIS — E662 Morbid (severe) obesity with alveolar hypoventilation: Secondary | ICD-10-CM | POA: Insufficient documentation

## 2012-12-06 DIAGNOSIS — F17200 Nicotine dependence, unspecified, uncomplicated: Secondary | ICD-10-CM | POA: Insufficient documentation

## 2012-12-06 DIAGNOSIS — I252 Old myocardial infarction: Secondary | ICD-10-CM | POA: Insufficient documentation

## 2012-12-06 DIAGNOSIS — I209 Angina pectoris, unspecified: Secondary | ICD-10-CM | POA: Insufficient documentation

## 2012-12-06 DIAGNOSIS — L98499 Non-pressure chronic ulcer of skin of other sites with unspecified severity: Secondary | ICD-10-CM | POA: Insufficient documentation

## 2012-12-06 DIAGNOSIS — I2510 Atherosclerotic heart disease of native coronary artery without angina pectoris: Principal | ICD-10-CM | POA: Insufficient documentation

## 2012-12-06 DIAGNOSIS — I200 Unstable angina: Secondary | ICD-10-CM

## 2012-12-06 DIAGNOSIS — R0602 Shortness of breath: Secondary | ICD-10-CM | POA: Insufficient documentation

## 2012-12-06 LAB — CBC WITH DIFFERENTIAL/PLATELET
Basophils Absolute: 0 10*3/uL (ref 0.0–0.1)
Basophils Relative: 0 % (ref 0–1)
Lymphocytes Relative: 40 % (ref 12–46)
Neutro Abs: 3.6 10*3/uL (ref 1.7–7.7)
Platelets: 214 10*3/uL (ref 150–400)
RDW: 16.1 % — ABNORMAL HIGH (ref 11.5–15.5)
WBC: 7.5 10*3/uL (ref 4.0–10.5)

## 2012-12-06 LAB — COMPREHENSIVE METABOLIC PANEL
ALT: 16 U/L (ref 0–53)
AST: 13 U/L (ref 0–37)
Albumin: 3.5 g/dL (ref 3.5–5.2)
CO2: 27 mEq/L (ref 19–32)
Calcium: 8.7 mg/dL (ref 8.4–10.5)
Chloride: 102 mEq/L (ref 96–112)
GFR calc non Af Amer: >90 mL/min (ref >90)
Sodium: 136 mEq/L (ref 135–145)
Total Bilirubin: 0.3 mg/dL (ref 0.3–1.2)

## 2012-12-06 MED ORDER — ONDANSETRON HCL 4 MG/2ML IJ SOLN
4.0000 mg | Freq: Once | INTRAMUSCULAR | Status: AC
  Administered 2012-12-06: 4 mg via INTRAVENOUS
  Filled 2012-12-06: qty 2

## 2012-12-06 MED ORDER — MORPHINE SULFATE 4 MG/ML IJ SOLN
4.0000 mg | Freq: Once | INTRAMUSCULAR | Status: AC
  Administered 2012-12-06: 4 mg via INTRAVENOUS
  Filled 2012-12-06: qty 1

## 2012-12-06 NOTE — ED Notes (Signed)
The pt arrived by gems from home lt chest and lt shoulder pain for 2 hours.  The pain is worse with movement and with inspiration. Nitro x3 sl aspirin 4  Given by ems.  Iv per ems  Non compliant with meds

## 2012-12-06 NOTE — ED Notes (Signed)
ekg within 10 minutes of pts arrival here

## 2012-12-06 NOTE — ED Notes (Addendum)
No change in pain after the sl nitro enroute

## 2012-12-06 NOTE — ED Provider Notes (Signed)
History    CSN: [id number] Arrival date & time 12/06/12  2054  First MD Initiated Contact with Patient 12/06/12 2110     Chief Complaint  Patient presents with  . Chest Pain   (Consider location/radiation/quality/duration/timing/severity/associated sxs/prior Treatment) HPI Comments: Presents by ambulance for evaluation of chest pain. Patient is history of heart disease including stenting. Patient reports that he has a residual blockage that was unreachable. Patient had pain that started approximately 3 hours before coming to the ER. He was given 3 sublingual nitroglycerin and improved pain. Patient also took 4 low-dose aspirin before coming to the ER. Patient reports mild shortness of breath. No nausea or diaphoresis. No abdominal pain or discomfort.  Patient is a 47 y.o. male presenting with chest pain.  Chest Pain Associated symptoms: shortness of breath    Past Medical History  Diagnosis Date  . Hyperlipidemia     takes Lipitor nightly  . 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 Stented coronary artery 2000, 2010  . Heart attack 2000  . Shortness of breath     with exertion  . Peripheral edema     takes Furosemide daily  . Pneumonia     hx of >70yrs ago  . Headache(784.0)   . Arthritis   . Joint pain   . Joint swelling   . Bruises easily     takes Effient daily  . GERD (gastroesophageal reflux disease)     takes Prilosec prn  . Urinary urgency   . Nocturia   . Diabetes mellitus     takes Januvia,Metformin,and Glipizide daily  . ADD (attention deficit disorder with hyperactivity)   . Anxiety     takes Xanax bid  . Depression     takes Celexa daily  . ADD (attention deficit disorder with hyperactivity)     takes Adderall daily  . Sleep apnea     uses CPAP nightly  . Hypertension   . Coronary artery disease     last cath 1/14   Past Surgical History  Procedure Laterality Date  . Knee surgery  24 yrs. ago    right  . Application of  wound vac  11/20/2010    and suture of bleeding post-op wound right buttock  . Rectal surgery  11/20/2010    exc. of infection perianal area right buttock (I & D)  . Ankle arthroscopy  05/13/2011    Procedure: ANKLE ARTHROSCOPY;  Surgeon: Loreta Ave, MD;  Location: [facility];  Service: Orthopedics;  Laterality: Right;  right ankle arthroscopy with extensive debridement synovectomy and chondroplasty  . Colonoscopy    . Total knee arthroplasty  10/13/2011    Procedure: TOTAL KNEE ARTHROPLASTY;  Surgeon: Loreta Ave, MD;  Location: [facility] OR;  Service: Orthopedics;  Laterality: Right;  DR MURPHY WANTS 90 MINUTES FOR THIS CASE  . Cardiac catheterization  12/05/2008    and in 2000  . Coronary angioplasty      2 stents  . Cardiac catheterization  1/14    no stent put in  . Hydradenitis excision Right 10/30/2012    Procedure: wide EXCISION HYDRADENITIS right buttock ;  Surgeon: Shelly Rubenstein, MD;  Location: [facility];  Service: General;  Laterality: Right;   Family History  Problem Relation Age of Onset  . Anesthesia problems Neg Hx   . Hypotension Neg Hx   . Malignant hyperthermia Neg Hx   . Pseudochol deficiency  Neg Hx    History  Substance Use Topics  . Smoking status: Current Every Day Smoker -- 1.00 packs/day for 25 years    Types: Cigarettes  . Smokeless tobacco: Never Used  . Alcohol Use: No    Review of Systems  Respiratory: Positive for shortness of breath.   Cardiovascular: Positive for chest pain.  All other systems reviewed and are negative.    Allergies  Review of patient's allergies indicates no known allergies.  Home Medications   Current Outpatient Rx  Name  Route  Sig  Dispense  Refill  . ALPRAZolam (XANAX) 1 MG tablet   Oral   Take 1 mg by mouth 2 (two) times daily as needed for anxiety.          Marland Kitchen amphetamine-dextroamphetamine (ADDERALL XR) 25 MG 24 hr capsule   Oral   Take 25 mg by mouth every morning.           Marland Kitchen aspirin 81 MG chewable tablet   Oral   Chew 81 mg by mouth 2 (two) times daily.          Marland Kitchen atorvastatin (LIPITOR) 40 MG tablet   Oral   Take 40 mg by mouth every evening.          . calcium carbonate (OS-CAL - DOSED IN MG OF ELEMENTAL CALCIUM) 1250 MG tablet   Oral   Take 1 tablet by mouth 2 (two) times daily with a meal.          . citalopram (CELEXA) 40 MG tablet   Oral   Take 40 mg by mouth every evening. PM         . furosemide (LASIX) 40 MG tablet   Oral   Take 40 mg by mouth daily.         Marland Kitchen glipiZIDE (GLUCOTROL) 10 MG tablet   Oral   Take 10 mg by mouth 2 (two) times daily before a meal.          . metFORMIN (GLUCOPHAGE) 1000 MG tablet   Oral   Take 1,000 mg by mouth 2 (two) times daily. Patient takes 1000 mg in morning and 1000mg  in evening, daily.         . Multiple Vitamin (MULTIVITAMIN WITH MINERALS) TABS   Oral   Take 1 tablet by mouth daily.         Marland Kitchen neomycin-bacitracin-polymyxin (NEOSPORIN) ointment   Topical   Apply 1 application topically every 12 (twelve) hours.          Marland Kitchen omeprazole (PRILOSEC) 40 MG capsule   Oral   Take 40 mg by mouth daily.         Marland Kitchen oxyCODONE-acetaminophen (PERCOCET) 10-325 MG per tablet   Oral   Take 1 tablet by mouth every 4 (four) hours as needed for pain.         . potassium chloride SA (K-DUR,KLOR-CON) 20 MEQ tablet   Oral   Take 20 mEq by mouth daily.         . ramipril (ALTACE) 10 MG capsule   Oral   Take 10 mg by mouth daily.          . sitaGLIPtin (JANUVIA) 100 MG tablet   Oral   Take 100 mg by mouth every morning.           BP 101/64  Pulse 71  Temp(Src) 97.7 F (36.5 C) (Oral)  Resp 20  SpO2 91% Physical Exam  Constitutional: He is oriented to  person, place, and time. He appears well-developed and well-nourished. No distress.  HENT:  Head: Normocephalic and atraumatic.  Right Ear: Hearing normal.  Left Ear: Hearing normal.  Nose: Nose normal.  Mouth/Throat:  Oropharynx is clear and moist and mucous membranes are normal.  Eyes: Conjunctivae and EOM are normal. Pupils are equal, round, and reactive to light.  Neck: Normal range of motion. Neck supple.  Cardiovascular: Regular rhythm, S1 normal and S2 normal.  Exam reveals no gallop and no friction rub.   No murmur heard. Pulmonary/Chest: Effort normal and breath sounds normal. No respiratory distress. He exhibits no tenderness.  Abdominal: Soft. Normal appearance and bowel sounds are normal. There is no hepatosplenomegaly. There is no tenderness. There is no rebound, no guarding, no tenderness at McBurney's point and negative Murphy's sign. No hernia.  Musculoskeletal: Normal range of motion.  Neurological: He is alert and oriented to person, place, and time. He has normal strength. No cranial nerve deficit or sensory deficit. Coordination normal. GCS eye subscore is 4. GCS verbal subscore is 5. GCS motor subscore is 6.  Skin: Skin is warm, dry and intact. No rash noted. No cyanosis.  Psychiatric: He has a normal mood and affect. His speech is normal and behavior is normal. Thought content normal.    ED Course  Procedures (including critical care time)  EKG:  Date: 12/06/2012  Rate: 67  Rhythm: normal sinus rhythm  QRS Axis: normal  Intervals: normal  ST/T Wave abnormalities: normal  Conduction Disutrbances:none  Narrative Interpretation:      Labs Reviewed  CBC WITH DIFFERENTIAL - Abnormal; Notable for the following:    RDW 16.1 (*)    All other components within normal limits  COMPREHENSIVE METABOLIC PANEL - Abnormal; Notable for the following:    BUN 5 (*)    All other components within normal limits  TROPONIN I   Dg Chest 2 View  12/06/2012   *RADIOLOGY REPORT*  Clinical Data: Chest pain.  Hypertension.  Diabetic.  CHEST - 2 VIEW  Comparison: 05/11/2011  Findings: Slight peribronchial thickening.  Heart is upper limits normal in size.  Lungs are clear.  No effusions or acute bony  abnormality.  IMPRESSION: Slight bronchitic changes.   Original Report Authenticated By: Charlett Nose, M.D.   Diagnosis: Chest pain  MDM  Patient presents to the ER for evaluation of chest pain. Patient is a diabetic with a previous history of coronary artery disease including stenting who presents to the ER with chest pain. Patient reports the pain started 2 hours before arrival. It began at rest and he did not exert himself to be able to exertion worsens the pain. Pain radiates to left shoulder. He improved significantly when he was given nitroglycerin by EMS. EKG arrival was normal. Troponin is negative.  She has a history of coronary artery disease, and medical noncompliance and multiple risk factors for worsening of his heart disease. Case discussed with Doctor Sharyn Lull, we'll admit to the hospital. Doctor Sharyn Lull asked that the patient initiated on heparin and nitroglycerin.    Gilda Crease, MD 12/07/12 [phone]

## 2012-12-07 LAB — CBC WITH DIFFERENTIAL/PLATELET
Eosinophils Absolute: 0.1 10*3/uL (ref 0.0–0.7)
Eosinophils Relative: 2 % (ref 0–5)
HCT: 38.1 % — ABNORMAL LOW (ref 39.0–52.0)
Lymphocytes Relative: 44 % (ref 12–46)
Lymphs Abs: 3.2 10*3/uL (ref 0.7–4.0)
MCH: 29.2 pg (ref 26.0–34.0)
MCV: 86.8 fL (ref 78.0–100.0)
Monocytes Absolute: 0.6 10*3/uL (ref 0.1–1.0)
Platelets: 206 10*3/uL (ref 150–400)
RBC: 4.39 MIL/uL (ref 4.22–5.81)

## 2012-12-07 LAB — CBC
Hemoglobin: 12.7 g/dL — ABNORMAL LOW (ref 13.0–17.0)
MCH: 28.7 pg (ref 26.0–34.0)
MCHC: 32.6 g/dL (ref 30.0–36.0)

## 2012-12-07 LAB — MRSA PCR SCREENING: MRSA by PCR: NEGATIVE

## 2012-12-07 LAB — GLUCOSE, CAPILLARY
Glucose-Capillary: 184 mg/dL — ABNORMAL HIGH (ref 70–99)
Glucose-Capillary: 199 mg/dL — ABNORMAL HIGH (ref 70–99)

## 2012-12-07 LAB — HEPARIN LEVEL (UNFRACTIONATED): Heparin Unfractionated: 0.41 IU/mL (ref 0.30–0.70)

## 2012-12-07 MED ORDER — ALPRAZOLAM 0.5 MG PO TABS: 1.0000 mg | ORAL_TABLET | Freq: Two times a day (BID) | ORAL | Status: DC | PRN

## 2012-12-07 MED ORDER — SODIUM CHLORIDE 0.9 % IV SOLN
INTRAVENOUS | Status: AC
  Administered 2012-12-07: 01:00:00 via INTRAVENOUS

## 2012-12-07 MED ORDER — PNEUMOCOCCAL VAC POLYVALENT 25 MCG/0.5ML IJ INJ
0.5000 mL | INJECTION | INTRAMUSCULAR | Status: AC
  Administered 2012-12-08: 0.5 mL via INTRAMUSCULAR
  Filled 2012-12-07: qty 0.5

## 2012-12-07 MED ORDER — ASPIRIN 300 MG RE SUPP
300.0000 mg | RECTAL | Status: AC
  Filled 2012-12-07: qty 1

## 2012-12-07 MED ORDER — METOPROLOL TARTRATE 12.5 MG HALF TABLET
12.5000 mg | ORAL_TABLET | Freq: Two times a day (BID) | ORAL | Status: DC
  Administered 2012-12-07 – 2012-12-08 (×2): 12.5 mg via ORAL
  Filled 2012-12-07 (×3): qty 1

## 2012-12-07 MED ORDER — PANTOPRAZOLE SODIUM 40 MG PO TBEC
40.0000 mg | DELAYED_RELEASE_TABLET | Freq: Every day | ORAL | Status: DC
  Administered 2012-12-08: 40 mg via ORAL
  Filled 2012-12-07: qty 1

## 2012-12-07 MED ORDER — NITROGLYCERIN 0.4 MG SL SUBL: 0.4000 mg | SUBLINGUAL_TABLET | SUBLINGUAL | Status: DC | PRN

## 2012-12-07 MED ORDER — ASPIRIN 81 MG PO CHEW: 81.0000 mg | CHEWABLE_TABLET | Freq: Two times a day (BID) | ORAL | Status: DC

## 2012-12-07 MED ORDER — LEVALBUTEROL HCL 1.25 MG/0.5ML IN NEBU
1.2500 mg | INHALATION_SOLUTION | Freq: Three times a day (TID) | RESPIRATORY_TRACT | Status: DC
  Administered 2012-12-07: 1.25 mg via RESPIRATORY_TRACT
  Filled 2012-12-07 (×4): qty 0.5

## 2012-12-07 MED ORDER — ACETAMINOPHEN 325 MG PO TABS: 650.0000 mg | ORAL_TABLET | ORAL | Status: DC | PRN

## 2012-12-07 MED ORDER — ONDANSETRON HCL 4 MG/2ML IJ SOLN: 4.0000 mg | Freq: Four times a day (QID) | INTRAMUSCULAR | Status: DC | PRN

## 2012-12-07 MED ORDER — BACITRACIN-NEOMYCIN-POLYMYXIN 400-5-5000 EX OINT
1.0000 "application " | TOPICAL_OINTMENT | Freq: Two times a day (BID) | CUTANEOUS | Status: DC
  Administered 2012-12-07 – 2012-12-08 (×2): 1 via TOPICAL
  Filled 2012-12-07 (×3): qty 1

## 2012-12-07 MED ORDER — HEPARIN BOLUS VIA INFUSION
4000.0000 [IU] | Freq: Once | INTRAVENOUS | Status: AC
  Administered 2012-12-07: 4000 [IU] via INTRAVENOUS

## 2012-12-07 MED ORDER — OXYCODONE-ACETAMINOPHEN 5-325 MG PO TABS
1.0000 | ORAL_TABLET | Freq: Three times a day (TID) | ORAL | Status: DC | PRN
  Administered 2012-12-07: 1 via ORAL
  Filled 2012-12-07: qty 1

## 2012-12-07 MED ORDER — NITROGLYCERIN IN D5W 200-5 MCG/ML-% IV SOLN
10.0000 ug/min | INTRAVENOUS | Status: DC
  Administered 2012-12-07: 5 ug/min via INTRAVENOUS
  Filled 2012-12-07: qty 250

## 2012-12-07 MED ORDER — ASPIRIN 81 MG PO CHEW
324.0000 mg | CHEWABLE_TABLET | ORAL | Status: AC
  Administered 2012-12-07: 324 mg via ORAL
  Filled 2012-12-07: qty 4

## 2012-12-07 MED ORDER — ATORVASTATIN CALCIUM 40 MG PO TABS
40.0000 mg | ORAL_TABLET | Freq: Every evening | ORAL | Status: DC
  Administered 2012-12-07: 40 mg via ORAL
  Filled 2012-12-07 (×2): qty 1

## 2012-12-07 MED ORDER — CITALOPRAM HYDROBROMIDE 40 MG PO TABS
40.0000 mg | ORAL_TABLET | Freq: Every evening | ORAL | Status: DC
  Administered 2012-12-07: 40 mg via ORAL
  Filled 2012-12-07 (×2): qty 1

## 2012-12-07 MED ORDER — LEVALBUTEROL HCL 1.25 MG/0.5ML IN NEBU
1.2500 mg | INHALATION_SOLUTION | Freq: Two times a day (BID) | RESPIRATORY_TRACT | Status: DC
  Administered 2012-12-07 – 2012-12-08 (×2): 1.25 mg via RESPIRATORY_TRACT
  Filled 2012-12-07 (×4): qty 0.5

## 2012-12-07 MED ORDER — HYDROMORPHONE HCL PF 1 MG/ML IJ SOLN: 1.0000 mg | INTRAMUSCULAR | Status: DC | PRN

## 2012-12-07 MED ORDER — INSULIN ASPART 100 UNIT/ML ~~LOC~~ SOLN
0.0000 [IU] | Freq: Three times a day (TID) | SUBCUTANEOUS | Status: DC
Start: 2012-12-08 — End: 2012-12-08
  Administered 2012-12-08: 2 [IU] via SUBCUTANEOUS

## 2012-12-07 MED ORDER — HEPARIN (PORCINE) IN NACL 100-0.45 UNIT/ML-% IJ SOLN
1450.0000 [IU]/h | INTRAMUSCULAR | Status: DC
  Administered 2012-12-07 (×2): 1450 [IU]/h via INTRAVENOUS
  Filled 2012-12-07 (×2): qty 250

## 2012-12-07 MED ORDER — HEPARIN (PORCINE) IN NACL 100-0.45 UNIT/ML-% IJ SOLN
1250.0000 [IU]/h | INTRAMUSCULAR | Status: DC
  Administered 2012-12-07: 1250 [IU]/h via INTRAVENOUS
  Filled 2012-12-07 (×2): qty 250

## 2012-12-07 MED ORDER — ASPIRIN EC 81 MG PO TBEC
81.0000 mg | DELAYED_RELEASE_TABLET | Freq: Every day | ORAL | Status: DC
  Administered 2012-12-08: 81 mg via ORAL
  Filled 2012-12-07: qty 1

## 2012-12-07 MED ORDER — ONDANSETRON HCL 4 MG/2ML IJ SOLN: 4.0000 mg | Freq: Three times a day (TID) | INTRAMUSCULAR | Status: AC | PRN

## 2012-12-07 NOTE — Progress Notes (Signed)
Dr Sharyn Lull made aware of BP 77/44, 97/55. Received order to discontinue Nitro Drip.Will continue to monitor blood pressure closely.Pt asleep at this time.

## 2012-12-07 NOTE — Progress Notes (Signed)
Utilization Review Completed.Cody [name] T6/26/2014  

## 2012-12-07 NOTE — Progress Notes (Signed)
ANTICOAGULATION CONSULT NOTE - Initial Consult  Pharmacy Consult for heparin Indication: chest pain/ACS  No Known Allergies  Patient Measurements: Height: 5\' 6"  (167.6 cm) Weight: 242 lb 8.1 oz (110 kg) IBW/kg (Calculated) : 63.8 Heparin Dosing Weight: 90kg  Vital Signs: Temp: 97.7 F (36.5 C) (06/25 2048) Temp src: Oral (06/25 2048) BP: 121/74 mmHg (06/25 2331) Pulse Rate: 62 (06/25 2331)  Labs:  Recent Labs  12/06/12 2228 12/06/12 2230  HGB 13.2  --   HCT 39.3  --   PLT 214  --   CREATININE 0.92  --   TROPONINI  --  <0.30    Estimated Creatinine Clearance: 119.3 ml/min (by C-G formula based on Cr of 0.92).   Medical History: Past Medical History  Diagnosis Date  . Hyperlipidemia     takes Lipitor nightly  . 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 Stented coronary artery 2000, 2010  . Heart attack 2000  . Shortness of breath     with exertion  . Peripheral edema     takes Furosemide daily  . Pneumonia     hx of >40yrs ago  . Headache(784.0)   . Arthritis   . Joint pain   . Joint swelling   . Bruises easily     takes Effient daily  . GERD (gastroesophageal reflux disease)     takes Prilosec prn  . Urinary urgency   . Nocturia   . Diabetes mellitus     takes Januvia,Metformin,and Glipizide daily  . ADD (attention deficit disorder with hyperactivity)   . Anxiety     takes Xanax bid  . Depression     takes Celexa daily  . ADD (attention deficit disorder with hyperactivity)     takes Adderall daily  . Sleep apnea     uses CPAP nightly  . Hypertension   . Coronary artery disease     last cath 1/14     Assessment: 47yo male c/o CP radiating to shoulder x2hr, initial troponin negative, to begin heparin.  Goal of Therapy:  Heparin level 0.3-0.7 units/ml Monitor platelets by anticoagulation protocol: Yes   Plan:  Will give heparin 4000 units IV bolus x1 followed by gtt at 1250 units/hr and monitor heparin levels and  CBC.  Vernard Gambles, PharmD, BCPS  12/07/2012,12:33 AM

## 2012-12-07 NOTE — Progress Notes (Signed)
ANTICOAGULATION CONSULT NOTE  Pharmacy Consult for heparin Indication: chest pain/ACS  No Known Allergies  Patient Measurements: Height: 5\' 6"  (167.6 cm) Weight: 236 lb 5.3 oz (107.2 kg) IBW/kg (Calculated) : 63.8 Heparin Dosing Weight: 90kg  Vital Signs: Temp: 97.6 F (36.4 C) (06/26 2002) Temp src: Oral (06/26 2002) BP: 112/71 mmHg (06/26 2002) Pulse Rate: 65 (06/26 2002)  Labs:  Recent Labs  12/06/12 2228 12/06/12 2230 12/07/12 0735 12/07/12 1347 12/07/12 2220  HGB 13.2  --  12.7*  --   --   HCT 39.3  --  38.9*  --   --   PLT 214  --  201  --   --   HEPARINUNFRC  --   --  0.31 0.23* 0.41  CREATININE 0.92  --   --   --   --   TROPONINI  --  <0.30  --   --   --     Estimated Creatinine Clearance: 117.7 ml/min (by C-G formula based on Cr of 0.92).  Assessment: 47 yo male with chest pain for heparin  Goal of Therapy:  Heparin level 0.3-0.7 units/ml Monitor platelets by anticoagulation protocol: Yes   Plan:  Continue Heparin at current rate  Geannie Risen, PharmD, BCPS   12/07/2012 11:00 PM

## 2012-12-07 NOTE — Progress Notes (Signed)
Nutrition Brief Note  Malnutrition Screening Tool result is inaccurate.  Please consult if nutrition needs are identified.  Lyndsi Altic MS, RD, LDN Pager: [phone] After-hours pager: [phone]    

## 2012-12-07 NOTE — H&P (Signed)
Cody [name] is an 47 y.o. male.   Chief Complaint: Recurrent chest pain HPI: Patient is 47 year old male with past medical history significant for coronary artery disease history of anteroseptal wall myocardial infarction in 1990s chronically occluded LAD, hypertension, non-insulin-dependent diabetes mellitus, hypercholesteremia, obstructive sleep apnea/obesity hypoventilation syndrome, morbid obesity, tobacco abuse, came to the ER by EMS complaining of recurrent retrosternal chest pain grade 6-7/10 took aspirin and sublingual nitroglycerin with partial relief called EMS and came to the ER her patient denies any nausea vomiting diaphoresis denies palpitation lightheadedness or syncope patient also gives history of exertional chest pain relieved with rest. Denies any nocturnal or rest angina. EKG done in the ER showed no acute ischemic changes first set of troponin I. is negative. Patient had the stress test in December of 2013 showed mild ischemia in the anteroseptal wall and fixed apical perfusion defect with EF of 51% patient subsequently had cardiac catheterization in January of 2014 which showed chronically occluded LAD filling faintly by collaterals from left to assist him. And was opted for medical management.  Past Medical History  Diagnosis Date  . Hyperlipidemia     takes Lipitor nightly  . 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 Stented coronary artery 2000, 2010  . Heart attack 2000  . Shortness of breath     with exertion  . Peripheral edema     takes Furosemide daily  . Pneumonia     hx of >40yrs ago  . Headache(784.0)   . Arthritis   . Joint pain   . Joint swelling   . Bruises easily     takes Effient daily  . GERD (gastroesophageal reflux disease)     takes Prilosec prn  . Urinary urgency   . Nocturia   . Diabetes mellitus     takes Januvia,Metformin,and Glipizide daily  . ADD (attention deficit disorder with hyperactivity)   . Anxiety     takes  Xanax bid  . Depression     takes Celexa daily  . ADD (attention deficit disorder with hyperactivity)     takes Adderall daily  . Sleep apnea     uses CPAP nightly  . Hypertension   . Coronary artery disease     last cath 1/14    Past Surgical History  Procedure Laterality Date  . Knee surgery  24 yrs. ago    right  . Application of wound vac  11/20/2010    and suture of bleeding post-op wound right buttock  . Rectal surgery  11/20/2010    exc. of infection perianal area right buttock (I & D)  . Ankle arthroscopy  05/13/2011    Procedure: ANKLE ARTHROSCOPY;  Surgeon: Loreta Ave, MD;  Location: [facility];  Service: Orthopedics;  Laterality: Right;  right ankle arthroscopy with extensive debridement synovectomy and chondroplasty  . Colonoscopy    . Total knee arthroplasty  10/13/2011    Procedure: TOTAL KNEE ARTHROPLASTY;  Surgeon: Loreta Ave, MD;  Location: [facility] OR;  Service: Orthopedics;  Laterality: Right;  DR MURPHY WANTS 90 MINUTES FOR THIS CASE  . Cardiac catheterization  12/05/2008    and in 2000  . Coronary angioplasty      2 stents  . Cardiac catheterization  1/14    no stent put in  . Hydradenitis excision Right 10/30/2012    Procedure: wide EXCISION HYDRADENITIS right buttock ;  Surgeon: Shelly Rubenstein, MD;  Location: [facility]  [employer];  Service: General;  Laterality: Right;    Family History  Problem Relation Age of Onset  . Anesthesia problems Neg Hx   . Hypotension Neg Hx   . Malignant hyperthermia Neg Hx   . Pseudochol deficiency Neg Hx    Social History:  reports that he has been smoking Cigarettes.  He has a 25 pack-year smoking history. He has never used smokeless tobacco. He reports that he does not drink alcohol or use illicit drugs.  Allergies: No Known Allergies  Medications Prior to Admission  Medication Sig Dispense Refill  . ALPRAZolam (XANAX) 1 MG tablet Take 1 mg by mouth 2 (two) times daily as needed for anxiety.        Marland Kitchen amphetamine-dextroamphetamine (ADDERALL XR) 25 MG 24 hr capsule Take 25 mg by mouth every morning.       Marland Kitchen aspirin 81 MG chewable tablet Chew 81 mg by mouth 2 (two) times daily.       Marland Kitchen atorvastatin (LIPITOR) 40 MG tablet Take 40 mg by mouth every evening.       . calcium carbonate (OS-CAL - DOSED IN MG OF ELEMENTAL CALCIUM) 1250 MG tablet Take 1 tablet by mouth 2 (two) times daily with a meal.       . citalopram (CELEXA) 40 MG tablet Take 40 mg by mouth every evening. PM      . furosemide (LASIX) 40 MG tablet Take 40 mg by mouth daily.      Marland Kitchen glipiZIDE (GLUCOTROL) 10 MG tablet Take 10 mg by mouth 2 (two) times daily before a meal.       . metFORMIN (GLUCOPHAGE) 1000 MG tablet Take 1,000 mg by mouth 2 (two) times daily. Patient takes 1000 mg in morning and 1000mg  in evening, daily.      . Multiple Vitamin (MULTIVITAMIN WITH MINERALS) TABS Take 1 tablet by mouth daily.      Marland Kitchen neomycin-bacitracin-polymyxin (NEOSPORIN) ointment Apply 1 application topically every 12 (twelve) hours.       Marland Kitchen omeprazole (PRILOSEC) 40 MG capsule Take 40 mg by mouth daily.      Marland Kitchen oxyCODONE-acetaminophen (PERCOCET) 10-325 MG per tablet Take 1 tablet by mouth every 4 (four) hours as needed for pain.      . potassium chloride SA (K-DUR,KLOR-CON) 20 MEQ tablet Take 20 mEq by mouth daily.      . ramipril (ALTACE) 10 MG capsule Take 10 mg by mouth daily.       . sitaGLIPtin (JANUVIA) 100 MG tablet Take 100 mg by mouth every morning.         Results for orders placed during the hospital encounter of 12/06/12 (from the past 48 hour(s))  CBC WITH DIFFERENTIAL     Status: Abnormal   Collection Time    12/06/12 10:28 PM      Result Value Range   WBC 7.5  4.0 - 10.5 K/uL   RBC 4.54  4.22 - 5.81 MIL/uL   Hemoglobin 13.2  13.0 - 17.0 g/dL   HCT 16.1  09.6 - 04.5 %   MCV 86.6  78.0 - 100.0 fL   MCH 29.1  26.0 - 34.0 pg   MCHC 33.6  30.0 - 36.0 g/dL   RDW 40.9 (*) 81.1 - 91.4 %   Platelets 214  150 - 400 K/uL    Neutrophils Relative % 48  43 - 77 %   Neutro Abs 3.6  1.7 - 7.7 K/uL   Lymphocytes Relative 40  12 - 46 %   Lymphs Abs 3.0  0.7 - 4.0 K/uL   Monocytes Relative 10  3 - 12 %   Monocytes Absolute 0.8  0.1 - 1.0 K/uL   Eosinophils Relative 2  0 - 5 %   Eosinophils Absolute 0.2  0.0 - 0.7 K/uL   Basophils Relative 0  0 - 1 %   Basophils Absolute 0.0  0.0 - 0.1 K/uL  COMPREHENSIVE METABOLIC PANEL     Status: Abnormal   Collection Time    12/06/12 10:28 PM      Result Value Range   Sodium 136  135 - 145 mEq/L   Potassium 4.3  3.5 - 5.1 mEq/L   Chloride 102  96 - 112 mEq/L   CO2 27  19 - 32 mEq/L   Glucose, Bld 88  70 - 99 mg/dL   BUN 5 (*) 6 - 23 mg/dL   Creatinine, Ser 6.04  0.50 - 1.35 mg/dL   Calcium 8.7  8.4 - 54.0 mg/dL   Total Protein 6.1  6.0 - 8.3 g/dL   Albumin 3.5  3.5 - 5.2 g/dL   AST 13  0 - 37 U/L   ALT 16  0 - 53 U/L   Alkaline Phosphatase 57  39 - 117 U/L   Total Bilirubin 0.3  0.3 - 1.2 mg/dL   GFR calc non Af Amer >90  >90 mL/min   GFR calc Af Amer >90  >90 mL/min   Comment:            The eGFR has been calculated     using the CKD EPI equation.     This calculation has not been     validated in all clinical     situations.     eGFR's persistently     <90 mL/min signify     possible Chronic Kidney Disease.  TROPONIN I     Status: None   Collection Time    12/06/12 10:30 PM      Result Value Range   Troponin I <0.30  <0.30 ng/mL   Comment:            Due to the release kinetics of cTnI,     a negative result within the first hours     of the onset of symptoms does not rule out     myocardial infarction with certainty.     If myocardial infarction is still suspected,     repeat the test at appropriate intervals.  MRSA PCR SCREENING     Status: None   Collection Time    12/07/12  2:35 AM      Result Value Range   MRSA by PCR NEGATIVE  NEGATIVE   Comment:            The GeneXpert MRSA Assay (FDA     approved for NASAL specimens     only), is one  component of a     comprehensive MRSA colonization     surveillance program. It is not     intended to diagnose MRSA     infection nor to guide or     monitor treatment for     MRSA infections.  GLUCOSE, CAPILLARY     Status: None   Collection Time    12/07/12  5:25 AM      Result Value Range   Glucose-Capillary 71  70 - 99 mg/dL   Dg Chest 2 View  9/81/1914   *  RADIOLOGY REPORT*  Clinical Data: Chest pain.  Hypertension.  Diabetic.  CHEST - 2 VIEW  Comparison: 05/11/2011  Findings: Slight peribronchial thickening.  Heart is upper limits normal in size.  Lungs are clear.  No effusions or acute bony abnormality.  IMPRESSION: Slight bronchitic changes.   Original Report Authenticated By: Charlett Nose, M.D.    Review of Systems  Constitutional: Negative for fever, chills and weight loss.  Eyes: Negative for blurred vision and double vision.  Respiratory: Negative for cough, hemoptysis and sputum production.   Cardiovascular: Positive for chest pain. Negative for palpitations, orthopnea, claudication and leg swelling.  Gastrointestinal: Negative for nausea, vomiting and abdominal pain.  Genitourinary: Negative for dysuria.  Neurological: Negative for dizziness and headaches.    Blood pressure 92/61, pulse 60, temperature 97.5 F (36.4 C), temperature source Oral, resp. rate 17, height 5\' 6"  (1.676 m), weight 107.2 kg (236 lb 5.3 oz), SpO2 94.00%. Physical Exam  Constitutional: He is oriented to person, place, and time. He appears well-developed and well-nourished.  HENT:  Head: Normocephalic and atraumatic.  Eyes: Conjunctivae are normal. Pupils are equal, round, and reactive to light. Left eye exhibits no discharge. No scleral icterus.  Neck: Normal range of motion. Neck supple. No JVD present. No tracheal deviation present. Thyromegaly present.  Cardiovascular: Normal rate and regular rhythm.   Murmur (soft systolic murmur noted no S3 gallop) heard. Respiratory: Effort normal. No  respiratory distress. He has wheezes (Faint expiratory wheezing).  GI: Soft. Bowel sounds are normal. He exhibits no distension. There is no tenderness. There is no rebound.  Musculoskeletal: He exhibits no edema and no tenderness.  Neurological: He is alert and oriented to person, place, and time.     Assessment/Plan Unstable angina rule out MI Coronary artery disease history of anteroseptal wall myocardial infarction with chronically occluded LAD Hypertension Non-insulin-dependent diabetes mellitus Morbid obesity Obesity hypoventilation syndrome/obstructive sleep apnea History of tobacco abuse History of seizure disorder Chronic nonhealing ulcer right buttock Plan As per orders We'll review the angiogram done in January of 2013 and discuss further management.  Carter Kassel N 12/07/2012, 7:19 AM

## 2012-12-07 NOTE — Progress Notes (Signed)
ANTICOAGULATION CONSULT NOTE - Follow Up Consult  Pharmacy Consult for heparin Indication: chest pain/ACS  No Known Allergies  Patient Measurements: Height: 5\' 6"  (167.6 cm) Weight: 236 lb 5.3 oz (107.2 kg) IBW/kg (Calculated) : 63.8 Heparin Dosing Weight: 90kg  Vital Signs: Temp: 97.3 F (36.3 C) (06/26 1217) Temp src: Oral (06/26 1217) BP: 91/51 mmHg (06/26 1217) Pulse Rate: 63 (06/26 1217)  Labs:  Recent Labs  12/06/12 2228 12/06/12 2230 12/07/12 0735 12/07/12 1347  HGB 13.2  --  12.7*  --   HCT 39.3  --  38.9*  --   PLT 214  --  201  --   HEPARINUNFRC  --   --  0.31 0.23*  CREATININE 0.92  --   --   --   TROPONINI  --  <0.30  --   --     Estimated Creatinine Clearance: 117.7 ml/min (by C-G formula based on Cr of 0.92).   Medications:  Scheduled:  . levalbuterol  1.25 mg Nebulization BID  . [START ON 12/08/2012] pneumococcal 23 valent vaccine  0.5 mL Intramuscular Tomorrow-1000   Infusions:  . heparin 1,250 Units/hr (12/07/12 0101)    Assessment: 47 yo male with CP on heparin and heparin level is 0.23 on second check today.  Goal of Therapy:  Heparin level 0.3-0.7 units/ml Monitor platelets by anticoagulation protocol: Yes   Plan:  -Increase heparin to 1450 units/hr -Heparin level in 6hrs  Harland German, Pharm D 12/07/2012 2:30 PM

## 2012-12-07 NOTE — Progress Notes (Signed)
ANTICOAGULATION CONSULT NOTE - Follow Up Consult  Pharmacy Consult for heparin Indication: chest pain/ACS  No Known Allergies  Patient Measurements: Height: 5\' 6"  (167.6 cm) Weight: 236 lb 5.3 oz (107.2 kg) IBW/kg (Calculated) : 63.8 Heparin Dosing Weight: 90kg  Vital Signs: Temp: 98 F (36.7 C) (06/26 0806) Temp src: Oral (06/26 0806) BP: 109/72 mmHg (06/26 0806) Pulse Rate: 53 (06/26 0806)  Labs:  Recent Labs  12/06/12 2228 12/06/12 2230 12/07/12 0735  HGB 13.2  --  12.7*  HCT 39.3  --  38.9*  PLT 214  --  201  HEPARINUNFRC  --   --  0.31  CREATININE 0.92  --   --   TROPONINI  --  <0.30  --     Estimated Creatinine Clearance: 117.7 ml/min (by C-G formula based on Cr of 0.92).   Medications:  Scheduled:  . sodium chloride   Intravenous STAT  . [START ON 12/08/2012] pneumococcal 23 valent vaccine  0.5 mL Intramuscular Tomorrow-1000   Infusions:  . heparin 1,250 Units/hr (12/07/12 0101)    Assessment: 47 yo male with CP on heparin and heparin level is 0.31. Troponin is negative thus far, Hg= 12.7 and plt= 201.  Goal of Therapy:  Heparin level 0.3-0.7 units/ml Monitor platelets by anticoagulation protocol: Yes   Plan:  -No heparin changes -Will recheck a heparin level later today to assure at goal  Harland German, Pharm D 12/07/2012 9:10 AM

## 2012-12-08 LAB — CBC
Hemoglobin: 13.1 g/dL (ref 13.0–17.0)
MCHC: 33.3 g/dL (ref 30.0–36.0)
Platelets: 198 10*3/uL (ref 150–400)
RBC: 4.51 MIL/uL (ref 4.22–5.81)

## 2012-12-08 LAB — TSH: TSH: 0.605 u[IU]/mL (ref 0.350–4.500)

## 2012-12-08 LAB — COMPREHENSIVE METABOLIC PANEL
BUN: 7 mg/dL (ref 6–23)
CO2: 29 mEq/L (ref 19–32)
Calcium: 8.4 mg/dL (ref 8.4–10.5)
Chloride: 103 mEq/L (ref 96–112)
Creatinine, Ser: 0.89 mg/dL (ref 0.50–1.35)
GFR calc Af Amer: >90 mL/min (ref >90)
GFR calc non Af Amer: >90 mL/min (ref >90)
Glucose, Bld: 182 mg/dL — ABNORMAL HIGH (ref 70–99)
Total Bilirubin: 0.2 mg/dL — ABNORMAL LOW (ref 0.3–1.2)

## 2012-12-08 LAB — BASIC METABOLIC PANEL
GFR calc Af Amer: >90 mL/min (ref >90)
GFR calc non Af Amer: >90 mL/min (ref >90)
Potassium: 4.8 mEq/L (ref 3.5–5.1)
Sodium: 139 mEq/L (ref 135–145)

## 2012-12-08 LAB — GLUCOSE, CAPILLARY: Glucose-Capillary: 163 mg/dL — ABNORMAL HIGH (ref 70–99)

## 2012-12-08 LAB — HEPARIN LEVEL (UNFRACTIONATED): Heparin Unfractionated: 0.38 IU/mL (ref 0.30–0.70)

## 2012-12-08 LAB — TROPONIN I
Troponin I: <0.3 ng/mL (ref <0.30)
Troponin I: <0.3 ng/mL (ref <0.30)

## 2012-12-08 LAB — LIPID PANEL: Cholesterol: 139 mg/dL (ref 0–200)

## 2012-12-08 LAB — PROTIME-INR: Prothrombin Time: 13.1 seconds (ref 11.6–15.2)

## 2012-12-08 LAB — MAGNESIUM: Magnesium: 2 mg/dL (ref 1.5–2.5)

## 2012-12-08 LAB — HEMOGLOBIN A1C: Mean Plasma Glucose: 140 mg/dL — ABNORMAL HIGH (ref <117)

## 2012-12-08 MED ORDER — NITROGLYCERIN 0.4 MG SL SUBL: 0.4000 mg | SUBLINGUAL_TABLET | SUBLINGUAL | Status: DC | PRN

## 2012-12-08 MED ORDER — CARVEDILOL 3.125 MG PO TABS: 3.1250 mg | ORAL_TABLET | Freq: Two times a day (BID) | ORAL | Status: DC

## 2012-12-08 MED ORDER — RAMIPRIL 5 MG PO CAPS: 10.0000 mg | ORAL_CAPSULE | Freq: Every day | ORAL | Status: DC

## 2012-12-08 NOTE — Discharge Summary (Signed)
  Discharge summary dictated on 12/08/2012 dictation number wrist [phone]

## 2012-12-08 NOTE — Progress Notes (Signed)
Pt discharged to home. Family and belongings at bedside. Discharge, medication, when to call MD, and emergency instructions given to patient. Pt verbalized understanding. Prescriptions at bedside. VS stable at time of discharge. No current questions or comments at this time. IV removed, leads removed, CCMD notified. Tamyrah Burbage L

## 2012-12-09 NOTE — Discharge Summary (Signed)
NAMESHIVAAY, STORMONT                   ACCOUNT NO.:  [id number]  MEDICAL RECORD NO.:  [id number]  LOCATION:  2617                         FACILITY:  [facility]  PHYSICIAN:  Eduardo Osier. Sharyn Lull, M.D. DATE OF BIRTH:  May 01, 1966  DATE OF ADMISSION:  12/06/2012 DATE OF DISCHARGE:  12/08/2012                              DISCHARGE SUMMARY   ADMITTING DIAGNOSES: 1. Unstable angina, rule out myocardial infarction, coronary artery     disease, history of anteroseptal wall myocardial infarction in the     past with chronically occluded left anterior descending artery. 2. Hypertension. 3. Non-insulin-dependent diabetes mellitus. 4. Morbid obesity. 5. Obesity hypoventilation syndrome/obstructive sleep apnea. 6. History of tobacco abuse. 7. History of seizure disorder. 8. Chronic nonhealing ulcer, right foot.  DISCHARGE DIAGNOSES: 1. Stable angina, myocardial infarction ruled out. 2. Coronary artery disease, history of anteroseptal wall myocardial     infarction in the past with chronically occluded left anterior     descending artery, mildly abnormal nuclear stress test in recent     past. 3. Hypertension. 4. Non-insulin-dependent diabetes mellitus. 5. Morbid obesity. 6. Obesity hypoventilation syndrome/obstructive sleep apnea. 7. History of tobacco abuse. 8. History of seizure disorder. 9. Chronic nonhealing ulcer, right buttock.  DISCHARGE HOME MEDICATIONS: 1. Carvedilol 3.125 mg 1 tablet twice daily. 2. Nitrostat 0.4 mg sublingual use as directed. 3. Ramipril 5 mg 1 tablet daily. 4. Xanax 1 mg twice daily as needed for anxiety as before. 5. Adderall XR 25 mg daily. 6. Enteric-coated aspirin 81 mg 1 tablet daily. 7. Atorvastatin 40 mg daily. 8. Os-Cal 1 tablet by mouth twice daily. 9. Citalopram 40 mg daily. 10.Furosemide 40 mg daily. 11.Glipizide 10 mg twice daily. 12.Metformin 1000 mg twice daily. 13.Multivitamin 1 tablet daily. 14.Neosporin, apply locally twice daily. 15.Omeprazole  40 mg daily. 16.Percocet 1 tablet every 4 hours as needed for pain as before. 17.Potassium chloride 20 mEq daily. 18.Januvia 100 mg 1 tablet daily.  DIET:  Low salt, low cholesterol.  The patient has been advised to monitor blood pressure and blood sugar daily.  ACTIVITY:  As tolerated.  CONDITION AT DISCHARGE:  Stable.  BRIEF HISTORY AND HOSPITAL COURSE:  Mr. Noreen is a 47 year old male with past medical history significant for coronary artery disease, history of anteroseptal wall myocardial infarction in 1990s, chronically occluded LAD, hypertension, non-insulin-dependent diabetes mellitus, hypercholesteremia, obstructive sleep apnea, obesity hypoventilation syndrome, morbid obesity, tobacco abuse.  He came to the ER by EMS complaining of recurrent retrosternal chest pain, grade 6-7/10.  He took aspirin and sublingual nitro with partial relief.  Called the EMS, came to the ER.  The patient denies any nausea, vomiting, diaphoresis. Denies palpitation, lightheadedness, or syncope.  The patient also gives history of exertional chest pain, relieves with rest.  Denies any nocturnal or rest angina.  EKG done in the ER showed no acute ischemic changes.  First set of troponin-I was negative.  The patient had stress test in December of 2013, which showed mild ischemia in the anteroseptal wall and fixed apical perfusion defect, which showed mild ischemia in the anteroseptal wall and fixed perfusion defect in the apex with EF of 51%.  The patient subsequently  had cardiac cath in January of 2014, which showed chronically occluded LAD, faintly filling by collaterals from left system and RCA.  The patient opted for medical management.  PAST MEDICAL HISTORY:  As above.  Also history of attention deficit disorder, depression.  PAST SURGICAL HISTORY:  He had knee surgery in the past, had rectal surgery in the past, had nonhealing ulcer, right buttock and multiple debridement in the  past.  PHYSICAL EXAMINATION:  GENERAL:  He is alert, awake, and oriented x3. VITAL SIGNS:  Blood pressure was 92/61, pulse was 60, regular. HEENT:  Conjunctivae was pink. NECK:  Supple.  No JVD.  No bruit. LUNGS:  Clear to auscultation without rhonchi or rales. CARDIOVASCULAR:  S1, S2 was normal.  There was soft systolic murmur.  No S3, gallop. ABDOMEN:  Soft.  Bowel sounds were present, obese, nontender. EXTREMITIES:  There was no clubbing, cyanosis, or edema.  LABORATORY DATA:  Three sets of troponin-I are normal.  Cholesterol was 139, triglycerides 98, LDL 79, HDL 40.  Hemoglobin was 12.7, hematocrit 39.9, white count of 6.8.  Blood sugar ranged between 78-114.  Sodium was 138, potassium 4.1, BUN 7, creatinine 0.89.  TSH was 0.60. Hemoglobin A1c is pending.  Repeat EKG showed sinus bradycardia with no acute ischemic changes.  BRIEF HOSPITAL COURSE:  The patient was admitted to telemetry unit.  MI was ruled out by serial enzymes and EKG.  Discussed with the patient at length regarding various options of treatment, i.e., maximizing antianginal medications versus re-attempt to open up video.  The patient opted for medical management.  The patient did not have any further episodes of chest pain during the hospital stay.  The patient will be discharged home on above medications and will be followed up in my office in 1 week.     Eduardo Osier. Sharyn Lull, M.D.     [facility]  D:  12/08/2012  T:  12/09/2012  Job:  [id number]

## 2012-12-13 ENCOUNTER — Encounter (INDEPENDENT_AMBULATORY_CARE_PROVIDER_SITE_OTHER): Payer: Self-pay | Admitting: Surgery

## 2012-12-13 ENCOUNTER — Ambulatory Visit (INDEPENDENT_AMBULATORY_CARE_PROVIDER_SITE_OTHER): Payer: Medicaid Other | Admitting: Surgery

## 2012-12-13 VITALS — BP 89/68 | HR 68 | Temp 97.6°F | Resp 14 | Ht 66.0 in | Wt 238.4 lb

## 2012-12-13 DIAGNOSIS — Z9889 Other specified postprocedural states: Secondary | ICD-10-CM

## 2012-12-13 NOTE — Progress Notes (Signed)
Subjective:     Patient ID: Cody [name], male   DOB: [birth year], 47 y.o.   MRN: [id number]  HPI He is here for another postoperative visit. He continues to do wet-to-dry dressing changes twice a day.  Review of Systems     Objective:   Physical Exam  On exam, his incision Continued to contract well. It is approximately 3 cm x 1.5 cm in size    Assessment:     Nonhealing surgical wound     Plan:     We will continue wet-to-dry dressing changes and I will see him back in 4 weeks

## 2012-12-29 ENCOUNTER — Ambulatory Visit: Payer: Medicaid Other | Admitting: Pulmonary Disease

## 2013-01-02 ENCOUNTER — Encounter: Payer: Self-pay | Admitting: Pulmonary Disease

## 2013-01-02 ENCOUNTER — Ambulatory Visit (INDEPENDENT_AMBULATORY_CARE_PROVIDER_SITE_OTHER): Payer: Medicaid Other | Admitting: Pulmonary Disease

## 2013-01-02 VITALS — BP 100/70 | HR 80 | Temp 98.1°F | Ht 66.0 in | Wt 237.6 lb

## 2013-01-02 DIAGNOSIS — G4733 Obstructive sleep apnea (adult) (pediatric): Secondary | ICD-10-CM

## 2013-01-02 NOTE — Patient Instructions (Addendum)
Will get a download off your machine in order to set your device on a fixed pressure.  Will call you with results once I receive. Work on weight loss Keep up with mask changes and supplies. followup with me in 6mos.

## 2013-01-02 NOTE — Assessment & Plan Note (Signed)
The pt has very severe osa, and has been on cpap since the last visit on the auto setting.  We never received the download from his dme, and therefore need to get this to put him on a fixed pressure.  I have also encouraged him to work aggressively on weight loss.  Will see him back in 6mos.

## 2013-01-02 NOTE — Progress Notes (Signed)
Subjective:     Patient ID: Cody [name], male   DOB: 1965/10/15, 47 y.o.   MRN: [id number]  HPI The patient comes in today for followup of his obstructive sleep apnea.  At his last visit, we put his machine on the automatic setting, and I was to receive a download from his medical equipment company to establish a fixed pressure.  Unfortunately, this was never done.  The patient states that he has been wearing CPAP as much as she can on the automatic pressure, and his bed partner has not heard breakthrough snoring.  Feels that he sleeps better with the device, with increased daytime alertness.  He denies having any mask issues.  Review of Systems  Constitutional: Negative for fever and unexpected weight change.  HENT: Positive for congestion. Negative for ear pain, nosebleeds, sore throat, rhinorrhea, sneezing, trouble swallowing, dental problem, postnasal drip and sinus pressure.   Eyes: Negative for redness and itching.  Respiratory: Positive for cough, chest tightness, shortness of breath and wheezing.   Cardiovascular: Negative for palpitations and leg swelling.  Gastrointestinal: Negative for nausea and vomiting.  Genitourinary: Negative for dysuria.  Musculoskeletal: Negative for joint swelling.  Skin: Negative for rash.  Neurological: Positive for dizziness, light-headedness and headaches.  Hematological: Does not bruise/bleed easily.  Psychiatric/Behavioral: Negative for dysphoric mood. The patient is not nervous/anxious.        Objective:   Physical Exam Obese male in no acute distress Nose without purulence or discharge noted Neck without lymphadenopathy or thyromegaly Lower extremities without edema, cyanosis Awake, but appears mildly sleepy, moves all 4 extremities.

## 2013-01-09 ENCOUNTER — Encounter (INDEPENDENT_AMBULATORY_CARE_PROVIDER_SITE_OTHER): Payer: Medicaid Other | Admitting: Surgery

## 2013-02-05 ENCOUNTER — Encounter (INDEPENDENT_AMBULATORY_CARE_PROVIDER_SITE_OTHER): Payer: Medicaid Other | Admitting: Surgery

## 2013-02-13 ENCOUNTER — Encounter (INDEPENDENT_AMBULATORY_CARE_PROVIDER_SITE_OTHER): Payer: Medicaid Other | Admitting: Surgery

## 2013-02-28 ENCOUNTER — Institutional Professional Consult (permissible substitution): Payer: Medicaid Other | Admitting: Pulmonary Disease

## 2013-03-06 ENCOUNTER — Ambulatory Visit (INDEPENDENT_AMBULATORY_CARE_PROVIDER_SITE_OTHER): Payer: Medicaid Other | Admitting: Surgery

## 2013-03-06 ENCOUNTER — Encounter (INDEPENDENT_AMBULATORY_CARE_PROVIDER_SITE_OTHER): Payer: Self-pay | Admitting: Surgery

## 2013-03-06 VITALS — BP 124/76 | HR 68 | Temp 97.6°F | Resp 16 | Ht 66.0 in | Wt 238.6 lb

## 2013-03-06 DIAGNOSIS — L732 Hidradenitis suppurativa: Secondary | ICD-10-CM

## 2013-03-06 MED ORDER — DOXYCYCLINE HYCLATE 100 MG PO TABS: 100.0000 mg | ORAL_TABLET | Freq: Two times a day (BID) | ORAL | Status: DC

## 2013-03-06 NOTE — Progress Notes (Signed)
Subjective:     Patient ID: Cody [name], male   DOB: 30-Mar-1966, 47 y.o.   MRN: [id number]  HPI He is here for followup of his chronic hidradenitis status post wide excision multiple times of the right buttock. He is having increased discomfort at the scar of the right buttock  Review of Systems     Objective:   Physical Exam In the old scar, he can feel some slight induration and there appears to be an area where there has been some skin breakdown but there is no cellulitis or fluctuance    Assessment:     Chronic hidradenitis     Plan:     Hopefully this is an early flareup and can't be controlled with antibiotics. I will place him back on doxycycline because this has worked for him in the past. I will see him back in 3 weeks. He will come back sooner if the area worsens

## 2013-03-26 ENCOUNTER — Encounter (INDEPENDENT_AMBULATORY_CARE_PROVIDER_SITE_OTHER): Payer: Medicaid Other | Admitting: Surgery

## 2013-03-30 ENCOUNTER — Institutional Professional Consult (permissible substitution): Payer: Medicaid Other | Admitting: Pulmonary Disease

## 2013-04-02 ENCOUNTER — Encounter: Payer: Self-pay | Admitting: Pulmonary Disease

## 2013-04-02 ENCOUNTER — Ambulatory Visit (INDEPENDENT_AMBULATORY_CARE_PROVIDER_SITE_OTHER): Payer: Medicaid Other | Admitting: Pulmonary Disease

## 2013-04-02 VITALS — BP 104/78 | HR 72 | Temp 97.4°F | Ht 67.0 in | Wt 243.6 lb

## 2013-04-02 DIAGNOSIS — R0609 Other forms of dyspnea: Secondary | ICD-10-CM | POA: Insufficient documentation

## 2013-04-02 NOTE — Patient Instructions (Signed)
No change in meds for now. Will schedule for breathing studies, and will arrange followup once done.   Work on smoking cessation and weight loss.

## 2013-04-02 NOTE — Progress Notes (Signed)
  Subjective:    Patient ID: Cody [name], male    DOB: 1965/12/01, 47 y.o.   MRN: [id number]  HPI The patient is a 47 year old male who I've been asked to see for shortness of breath.  This is a long-standing issue, and currently the patient notes a one to 2 block dyspnea on exertion at a moderate pace on flat ground.  He will get winded on occasion bringing in groceries, but will definitely gets her breath walking up one flight of stairs.  He has a long history of tobacco abuse, but has cut way back.  He tells me that he has not had full pulmonary function studies, but a recent chest x-ray in June shows no acute process.  He has mild chronic cough, but no significant mucous production.  He has only intermittent lower extremity edema.  He does have known coronary artery disease with myocardial infarction, and a recent catheterization showed a chronically occluded LAD.  The patient opted for medical management.  He has been started on symbicort as well as Spiriva, and feels they have helped some.   Review of Systems  Constitutional: Negative for fever and unexpected weight change.  HENT: Negative for congestion, dental problem, ear pain, nosebleeds, postnasal drip, rhinorrhea, sinus pressure, sneezing, sore throat and trouble swallowing.   Eyes: Negative for redness and itching.  Respiratory: Positive for cough, chest tightness, shortness of breath and wheezing.   Cardiovascular: Positive for leg swelling. Negative for palpitations.  Gastrointestinal: Negative for nausea and vomiting.  Genitourinary: Negative for dysuria.  Musculoskeletal: Negative for joint swelling.  Skin: Negative for rash.  Neurological: Negative for headaches.  Hematological: Does not bruise/bleed easily.  Psychiatric/Behavioral: Negative for dysphoric mood. The patient is not nervous/anxious.        Objective:   Physical Exam Constitutional:  Morbidly obese male, no acute distress  HENT:  Nares patent without  discharge, mild crusting noted.   Oropharynx without exudate, palate and uvula are thick and elongated.   Eyes:  Perrla, eomi, no scleral icterus  Neck:  No JVD, no TMG  Cardiovascular:  Normal rate, regular rhythm, no rubs or gallops.  No murmurs        Intact distal pulses  Pulmonary :  Mildly decreased breath sounds, no stridor or respiratory distress   No rales, rhonchi, or wheezing  Abdominal:  Soft, nondistended, bowel sounds present.  No tenderness noted.   Musculoskeletal:  Minimal lower extremity edema noted.  Lymph Nodes:  No cervical lymphadenopathy noted  Skin:  No cyanosis noted  Neurologic:  Alert, appropriate, moves all 4 extremities without obvious deficit.         Assessment & Plan:

## 2013-04-02 NOTE — Assessment & Plan Note (Signed)
The patient has chronic dyspnea on exertion that I suspect is multifactorial.  He is clearly morbidly obese and deconditioned, has known coronary disease with a chronically occluded LAD, but it is unclear whether he has any significant lung disease.  He is continuing to smoke, but has decreased significantly.  We have discussed the importance of total smoking cessation.  Also discussed with him the importance of aggressive weight loss and some type of conditioning program.  He has a long history of smoking, but it is unclear at this point if he really has COPD or not.  He may just have chronic asthmatic bronchitis related to his ongoing smoking and airway inflammation.  We'll schedule the patient for full PFTs for further evaluation and arrange followup.

## 2013-04-16 ENCOUNTER — Ambulatory Visit (INDEPENDENT_AMBULATORY_CARE_PROVIDER_SITE_OTHER): Payer: Medicaid Other | Admitting: Pulmonary Disease

## 2013-04-16 DIAGNOSIS — R0609 Other forms of dyspnea: Secondary | ICD-10-CM

## 2013-04-16 LAB — PULMONARY FUNCTION TEST

## 2013-04-16 NOTE — Progress Notes (Signed)
PFT done today. 

## 2013-04-19 ENCOUNTER — Telehealth: Payer: Self-pay | Admitting: Pulmonary Disease

## 2013-04-19 NOTE — Telephone Encounter (Signed)
LMOM x 1 

## 2013-04-19 NOTE — Telephone Encounter (Signed)
Pt needs ov to review his pfts.

## 2013-04-20 NOTE — Telephone Encounter (Signed)
Pt schd apt on Monday at 1:30p with KC to go over PFT results.  Nothing further was needed at this time. Will sign off

## 2013-04-23 ENCOUNTER — Encounter: Payer: Self-pay | Admitting: Pulmonary Disease

## 2013-04-23 ENCOUNTER — Encounter (INDEPENDENT_AMBULATORY_CARE_PROVIDER_SITE_OTHER): Payer: Medicaid Other | Admitting: Surgery

## 2013-04-23 ENCOUNTER — Ambulatory Visit (INDEPENDENT_AMBULATORY_CARE_PROVIDER_SITE_OTHER): Payer: Medicaid Other | Admitting: Pulmonary Disease

## 2013-04-23 VITALS — BP 102/90 | HR 73 | Temp 97.8°F | Ht 67.0 in | Wt 249.0 lb

## 2013-04-23 DIAGNOSIS — R0609 Other forms of dyspnea: Secondary | ICD-10-CM

## 2013-04-23 NOTE — Patient Instructions (Signed)
You do not have copd by your breathing studies. Stop spiriva and symbicort Ok to still use albuterol as needed, but if you were to stop smoking, you would not require anything. Work on [employer] loss and exercise program.

## 2013-04-23 NOTE — Progress Notes (Signed)
  Subjective:    Patient ID: Cody [name], male    DOB: 11/28/1965, 47 y.o.   MRN: [id number]  HPI The patient comes in today for followup of his recent pulmonary function studies, as part of a workup for dyspnea on exertion.  He was found to have no air flow obstruction, mild restriction that is probably related to his obesity, and a very mild reduction in his diffusion capacity.  I have reviewed this study with him in detail, and answered all of his questions.  Unfortunately, he continues to smoke.   Review of Systems  Constitutional: Negative for fever and unexpected weight change.  HENT: Negative for congestion, dental problem, ear pain, nosebleeds, postnasal drip, rhinorrhea, sinus pressure, sneezing, sore throat and trouble swallowing.   Eyes: Negative for redness and itching.  Respiratory: Positive for cough, shortness of breath and wheezing. Negative for chest tightness.   Cardiovascular: Negative for palpitations and leg swelling.  Gastrointestinal: Negative for nausea and vomiting.  Genitourinary: Negative for dysuria.  Musculoskeletal: Negative for joint swelling.  Skin: Negative for rash.  Neurological: Negative for headaches.  Hematological: Does not bruise/bleed easily.  Psychiatric/Behavioral: Negative for dysphoric mood. The patient is not nervous/anxious.        Objective:   Physical Exam Obese male in no acute distress Nose without purulence or discharge noted Neck without lymphadenopathy or thyromegaly Lower extremities with mild edema, no cyanosis Alert and oriented, moves all 4 extremities.       Assessment & Plan:

## 2013-04-23 NOTE — Assessment & Plan Note (Signed)
The patient does not have air flow obstruction by his full pulmonary function studies, but does have restriction related to his centripetal obesity.  I have asked him to discontinue his Spiriva and symbicort, but can continue albuterol as needed.  I've also counseled him on the importance of total smoking cessation.  I suspect that weight loss and conditioning would do more for his breathing than any other medication.

## 2013-05-08 ENCOUNTER — Encounter (INDEPENDENT_AMBULATORY_CARE_PROVIDER_SITE_OTHER): Payer: Medicaid Other | Admitting: Surgery

## 2013-05-22 ENCOUNTER — Encounter (INDEPENDENT_AMBULATORY_CARE_PROVIDER_SITE_OTHER): Payer: Medicaid Other | Admitting: Surgery

## 2013-06-18 ENCOUNTER — Emergency Department (HOSPITAL_COMMUNITY)
Admission: EM | Admit: 2013-06-18 | Discharge: 2013-06-18 | Disposition: A | Discharge status: Home / Self Care | Payer: Medicaid Other | Attending: Emergency Medicine | Admitting: Emergency Medicine

## 2013-06-18 ENCOUNTER — Encounter (HOSPITAL_COMMUNITY): Payer: Self-pay | Admitting: Emergency Medicine

## 2013-06-18 DIAGNOSIS — E119 Type 2 diabetes mellitus without complications: Secondary | ICD-10-CM | POA: Insufficient documentation

## 2013-06-18 DIAGNOSIS — Z8669 Personal history of other diseases of the nervous system and sense organs: Secondary | ICD-10-CM | POA: Insufficient documentation

## 2013-06-18 DIAGNOSIS — E785 Hyperlipidemia, unspecified: Secondary | ICD-10-CM | POA: Insufficient documentation

## 2013-06-18 DIAGNOSIS — K219 Gastro-esophageal reflux disease without esophagitis: Secondary | ICD-10-CM | POA: Insufficient documentation

## 2013-06-18 DIAGNOSIS — Z8701 Personal history of pneumonia (recurrent): Secondary | ICD-10-CM | POA: Insufficient documentation

## 2013-06-18 DIAGNOSIS — F909 Attention-deficit hyperactivity disorder, unspecified type: Secondary | ICD-10-CM | POA: Insufficient documentation

## 2013-06-18 DIAGNOSIS — I2510 Atherosclerotic heart disease of native coronary artery without angina pectoris: Secondary | ICD-10-CM | POA: Insufficient documentation

## 2013-06-18 DIAGNOSIS — M129 Arthropathy, unspecified: Secondary | ICD-10-CM | POA: Insufficient documentation

## 2013-06-18 DIAGNOSIS — R5381 Other malaise: Secondary | ICD-10-CM | POA: Insufficient documentation

## 2013-06-18 DIAGNOSIS — Z9861 Coronary angioplasty status: Secondary | ICD-10-CM | POA: Insufficient documentation

## 2013-06-18 DIAGNOSIS — Z7982 Long term (current) use of aspirin: Secondary | ICD-10-CM | POA: Insufficient documentation

## 2013-06-18 DIAGNOSIS — I252 Old myocardial infarction: Secondary | ICD-10-CM | POA: Insufficient documentation

## 2013-06-18 DIAGNOSIS — G4730 Sleep apnea, unspecified: Secondary | ICD-10-CM | POA: Insufficient documentation

## 2013-06-18 DIAGNOSIS — F329 Major depressive disorder, single episode, unspecified: Secondary | ICD-10-CM | POA: Insufficient documentation

## 2013-06-18 DIAGNOSIS — R5383 Other fatigue: Secondary | ICD-10-CM

## 2013-06-18 DIAGNOSIS — F411 Generalized anxiety disorder: Secondary | ICD-10-CM | POA: Insufficient documentation

## 2013-06-18 DIAGNOSIS — F3289 Other specified depressive episodes: Secondary | ICD-10-CM | POA: Insufficient documentation

## 2013-06-18 DIAGNOSIS — I10 Essential (primary) hypertension: Secondary | ICD-10-CM | POA: Insufficient documentation

## 2013-06-18 DIAGNOSIS — Z79899 Other long term (current) drug therapy: Secondary | ICD-10-CM | POA: Insufficient documentation

## 2013-06-18 DIAGNOSIS — R739 Hyperglycemia, unspecified: Secondary | ICD-10-CM

## 2013-06-18 DIAGNOSIS — F17200 Nicotine dependence, unspecified, uncomplicated: Secondary | ICD-10-CM | POA: Insufficient documentation

## 2013-06-18 DIAGNOSIS — Z87448 Personal history of other diseases of urinary system: Secondary | ICD-10-CM | POA: Insufficient documentation

## 2013-06-18 LAB — GLUCOSE, CAPILLARY
GLUCOSE-CAPILLARY: 206 mg/dL — AB (ref 70–99)
GLUCOSE-CAPILLARY: 131 mg/dL — AB (ref 70–99)

## 2013-06-18 NOTE — ED Provider Notes (Signed)
CSN: [id number]     Arrival date & time 06/18/13  1513 History   First MD Initiated Contact with Patient 06/18/13 1916     Chief Complaint  Patient presents with  . Hyperglycemia   (Consider location/radiation/quality/duration/timing/severity/associated sxs/prior Treatment) HPI Comments: 48 yo male with DM, MI, stents, smoker presents with high glu for 3 days in 300s.  Pt has had mild fatigue since.  Sxs have improved since in ED and glu improved to low 100s.  Pt on metformin 1000 mg bid, no missed doses, not on inslulin.   Pt has no cp, vomiting, fevers, recent infections or other concerns.  Pt has an appetite and looking forward to going home, he keeps daily log for his cardiologist. Pt has been trying to exercise more.   The history is provided by the patient.    Past Medical History  Diagnosis Date  . Hyperlipidemia     takes Lipitor nightly  . 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 Stented coronary artery 2000, 2010  . Heart attack 2000  . Shortness of breath     with exertion  . Peripheral edema     takes Furosemide daily  . Pneumonia     hx of >25yrs ago  . Headache(784.0)   . Arthritis   . Joint pain   . Joint swelling   . Bruises easily     takes Effient daily  . GERD (gastroesophageal reflux disease)     takes Prilosec prn  . Urinary urgency   . Nocturia   . Diabetes mellitus     takes Januvia,Metformin,and Glipizide daily  . ADD (attention deficit disorder with hyperactivity)   . Anxiety     takes Xanax bid  . Depression     takes Celexa daily  . ADD (attention deficit disorder with hyperactivity)     takes Adderall daily  . Sleep apnea     uses CPAP nightly  . Hypertension   . Coronary artery disease     last cath 1/14   Past Surgical History  Procedure Laterality Date  . Knee surgery  24 yrs. ago    right  . Application of wound vac  11/20/2010    and suture of bleeding post-op wound right buttock  . Rectal surgery  11/20/2010     exc. of infection perianal area right buttock (I & D)  . Ankle arthroscopy  05/13/2011    Procedure: ANKLE ARTHROSCOPY;  Surgeon: Ninetta Lights, MD;  Location: [facility];  Service: Orthopedics;  Laterality: Right;  right ankle arthroscopy with extensive debridement synovectomy and chondroplasty  . Colonoscopy    . Total knee arthroplasty  10/13/2011    Procedure: TOTAL KNEE ARTHROPLASTY;  Surgeon: Ninetta Lights, MD;  Location: [place] [facility];  Service: Orthopedics;  Laterality: Right;  DR MURPHY WANTS 90 MINUTES FOR THIS CASE  . Cardiac catheterization  12/05/2008    and in 2000  . Coronary angioplasty      2 stents  . Cardiac catheterization  1/14    no stent put in  . Hydradenitis excision Right 10/30/2012    Procedure: wide EXCISION HYDRADENITIS right buttock ;  Surgeon: Harl Bowie, MD;  Location: [place];  Service: General;  Laterality: Right;   Family History  Problem Relation Age of Onset  . Anesthesia problems Neg Hx   . Hypotension Neg Hx   . Malignant hyperthermia Neg Hx   .  Pseudochol deficiency Neg Hx    History  Substance Use Topics  . Smoking status: Current Every Day Smoker -- 0.50 packs/day for 25 years    Types: Cigarettes  . Smokeless tobacco: Never Used  . Alcohol Use: No    Review of Systems  Constitutional: Positive for fatigue. Negative for fever and chills.  HENT: Negative for congestion.   Eyes: Negative for visual disturbance.  Respiratory: Negative for shortness of breath.   Cardiovascular: Negative for chest pain.  Gastrointestinal: Negative for vomiting and abdominal pain.  Genitourinary: Negative for dysuria and flank pain.  Musculoskeletal: Negative for back pain, neck pain and neck stiffness.  Skin: Negative for rash.  Neurological: Negative for light-headedness and headaches.    Allergies  Review of patient's allergies indicates no known allergies.  Home Medications   Current Outpatient Rx  Name  Route   Sig  Dispense  Refill  . albuterol (PROVENTIL HFA;VENTOLIN HFA) 108 (90 BASE) MCG/ACT inhaler   Inhalation   Inhale 2 puffs into the lungs every 6 (six) hours as needed for wheezing.         Marland Kitchen ALPRAZolam (XANAX) 1 MG tablet   Oral   Take 1 mg by mouth 2 (two) times daily as needed for anxiety.          Marland Kitchen aspirin 81 MG chewable tablet   Oral   Chew 81 mg by mouth 2 (two) times daily.          Marland Kitchen atorvastatin (LIPITOR) 40 MG tablet   Oral   Take 40 mg by mouth every evening.          . calcium carbonate (OS-CAL - DOSED IN MG OF ELEMENTAL CALCIUM) 1250 MG tablet   Oral   Take 1 tablet by mouth at bedtime.          . carvedilol (COREG) 3.125 MG tablet   Oral   Take 1 tablet (3.125 mg total) by mouth 2 (two) times daily with a meal.   60 tablet   3   . citalopram (CELEXA) 40 MG tablet   Oral   Take 40 mg by mouth every evening. PM         . dicyclomine (BENTYL) 20 MG tablet   Oral   Take 20 mg by mouth 2 (two) times daily.         . furosemide (LASIX) 40 MG tablet   Oral   Take 40 mg by mouth 2 (two) times daily.          Marland Kitchen glipiZIDE (GLUCOTROL) 10 MG tablet   Oral   Take 10 mg by mouth 2 (two) times daily before a meal.          . metFORMIN (GLUCOPHAGE) 1000 MG tablet   Oral   Take 1,000 mg by mouth 2 (two) times daily. Patient takes 1000 mg in morning and 1000mg  in evening, daily.         . Multiple Vitamin (MULTIVITAMIN WITH MINERALS) TABS   Oral   Take 1 tablet by mouth daily.         . nitroGLYCERIN (NITROSTAT) 0.4 MG SL tablet   Sublingual   Place 1 tablet (0.4 mg total) under the tongue every 5 (five) minutes x 3 doses as needed for chest pain.   25 tablet   12   . omeprazole (PRILOSEC) 40 MG capsule   Oral   Take 40 mg by mouth at bedtime.          Marland Kitchen  oxyCODONE-acetaminophen (PERCOCET) 10-325 MG per tablet   Oral   Take 1 tablet by mouth every 4 (four) hours as needed for pain.         . phenytoin (DILANTIN) 100 MG ER  capsule   Oral   Take 100 mg by mouth 3 (three) times daily.         . potassium chloride SA (K-DUR,KLOR-CON) 20 MEQ tablet   Oral   Take 20 mEq by mouth daily.         . ramipril (ALTACE) 5 MG capsule   Oral   Take 5 mg by mouth 2 (two) times daily.         . sitaGLIPtin (JANUVIA) 100 MG tablet   Oral   Take 100 mg by mouth every morning.           BP 111/78  Pulse 71  Temp(Src) 98.2 F (36.8 C) (Oral)  Resp 19  SpO2 98% Physical Exam  Nursing note and vitals reviewed. Constitutional: He is oriented to person, place, and time. He appears well-developed and well-nourished.  HENT:  Head: Normocephalic and atraumatic.  Eyes: Conjunctivae are normal. Right eye exhibits no discharge. Left eye exhibits no discharge.  Neck: Normal range of motion. Neck supple. No tracheal deviation present.  Cardiovascular: Normal rate and regular rhythm.   Pulmonary/Chest: Effort normal and breath sounds normal.  Abdominal: Soft. He exhibits no distension. There is no tenderness. There is no guarding.  Musculoskeletal: He exhibits no edema and no tenderness.  Neurological: He is alert and oriented to person, place, and time.  Skin: Skin is warm. No rash noted.  Psychiatric: He has a normal mood and affect.    ED Course  Procedures (including critical care time) Labs Review Labs Reviewed  GLUCOSE, CAPILLARY - Abnormal; Notable for the following:    Glucose-Capillary 206 (*)    All other components within normal limits  GLUCOSE, CAPILLARY - Abnormal; Notable for the following:    Glucose-Capillary 131 (*)    All other components within normal limits   Imaging Review No results found.  EKG Interpretation   None       MDM   1. Hyperglycemia   2. Fatigue    No signs of infection. No vomiting, well hydrated. Glu improved in ED. Long discussion regarding importance of glu control. Pt has close fup outpt.  No indication for labs at this time.  PO fluids and  home.  Results and differential diagnosis were discussed with the patient. Close follow up outpatient was discussed, patient comfortable with the plan.   Diagnosis: above    Mariea Clonts, MD 06/18/13 [phone]

## 2013-06-18 NOTE — Discharge Instructions (Signed)
Stay well hydrated with water. Keep a log of your glucose and discuss with your heart doctor. Avoid simple sugars (white breads, sweets, soda, et Ronney Asters) and continue to exercise.  If you were given medicines take as directed.  If you are on coumadin or contraceptives realize their levels and effectiveness is altered by many different medicines.  If you have any reaction (rash, tongues swelling, other) to the medicines stop taking and see a physician.   Please follow up as directed and return to the ER or see a physician for new or worsening symptoms.  Thank you.

## 2013-06-18 NOTE — ED Notes (Signed)
Pt c/o hyperglycemia and sent here by PCP; pt sts normally only takes PO meds and sts CBG running in 200's x 3 days

## 2013-06-22 ENCOUNTER — Encounter (INDEPENDENT_AMBULATORY_CARE_PROVIDER_SITE_OTHER): Payer: Medicaid Other | Admitting: Surgery

## 2013-07-06 ENCOUNTER — Ambulatory Visit: Payer: Medicaid Other | Admitting: Pulmonary Disease

## 2013-07-16 ENCOUNTER — Ambulatory Visit: Payer: Medicaid Other | Admitting: Pulmonary Disease

## 2013-07-23 ENCOUNTER — Encounter (INDEPENDENT_AMBULATORY_CARE_PROVIDER_SITE_OTHER): Payer: Medicaid Other | Admitting: Surgery

## 2013-09-05 ENCOUNTER — Encounter (INDEPENDENT_AMBULATORY_CARE_PROVIDER_SITE_OTHER): Payer: Self-pay | Admitting: Surgery

## 2013-09-07 ENCOUNTER — Encounter (INDEPENDENT_AMBULATORY_CARE_PROVIDER_SITE_OTHER): Payer: Self-pay

## 2013-09-07 ENCOUNTER — Telehealth (INDEPENDENT_AMBULATORY_CARE_PROVIDER_SITE_OTHER): Payer: Self-pay

## 2013-09-07 ENCOUNTER — Encounter (INDEPENDENT_AMBULATORY_CARE_PROVIDER_SITE_OTHER): Payer: Self-pay | Admitting: Surgery

## 2013-09-07 ENCOUNTER — Ambulatory Visit (INDEPENDENT_AMBULATORY_CARE_PROVIDER_SITE_OTHER): Payer: Medicaid Other | Admitting: Surgery

## 2013-09-07 VITALS — BP 134/70 | HR 76 | Temp 98.6°F | Resp 16 | Ht 67.0 in | Wt 248.2 lb

## 2013-09-07 DIAGNOSIS — K8020 Calculus of gallbladder without cholecystitis without obstruction: Secondary | ICD-10-CM

## 2013-09-07 MED ORDER — OXYCODONE-ACETAMINOPHEN 10-325 MG PO TABS: 1.0000 | ORAL_TABLET | ORAL | Status: DC | PRN

## 2013-09-07 NOTE — Addendum Note (Signed)
Addended by: Coralie Keens A on: 09/07/2013 12:07 PM   Modules accepted: Orders

## 2013-09-07 NOTE — Progress Notes (Signed)
Subjective:     Patient ID: Cody [name], male   DOB: [birth year], 48 y.o.   MRN: [id number]  HPI This is a patient well-known to me with a history of hidradenitis. He now has symptomatic cholelithiasis. He has been having right upper quadrant abdominal pain with occasional nausea for lease 4-5 months.  Review of Systems     Objective:   Physical Exam On exam, there is some mild tenderness and guarding in the right upper quadrant. He also has a small easily reducible umbilical hernia  His most recent ultrasound shows a contracted gallbladder with gallstones.The bile duct is normal    Assessment:     Systematic cholelithiasis     Plan:     He does have a significant pulmonary history but has recently seen his pulmonologist. He also has a cardiac history as well. We will have to get cardiac clearance before scheduling him for a laparoscopic cholecystectomy. Once he is cleared, I will schedule him for surgery. I did discuss surgery with him in detail

## 2013-09-07 NOTE — Telephone Encounter (Signed)
Pre-Op Cardiac Clearance faxed to Dr. Terrence Dupont @ [phone].  Faxed confirmation rec'd and attached to outgoing fax.  Clearance also sent via EPIC.

## 2013-09-12 ENCOUNTER — Other Ambulatory Visit: Payer: Self-pay | Admitting: Neurosurgery

## 2013-09-12 DIAGNOSIS — M5410 Radiculopathy, site unspecified: Secondary | ICD-10-CM

## 2013-09-12 DIAGNOSIS — M549 Dorsalgia, unspecified: Secondary | ICD-10-CM

## 2013-09-18 ENCOUNTER — Ambulatory Visit
Admission: RE | Admit: 2013-09-18 | Discharge: 2013-09-18 | Disposition: A | Discharge status: Home / Self Care | Payer: Medicaid Other | Source: Ambulatory Visit | Attending: Neurosurgery | Admitting: Neurosurgery

## 2013-09-18 VITALS — BP 95/58 | HR 61

## 2013-09-18 DIAGNOSIS — M5410 Radiculopathy, site unspecified: Secondary | ICD-10-CM

## 2013-09-18 DIAGNOSIS — M549 Dorsalgia, unspecified: Secondary | ICD-10-CM

## 2013-09-18 MED ORDER — DIAZEPAM 5 MG PO TABS
10.0000 mg | ORAL_TABLET | Freq: Once | ORAL | Status: AC
  Administered 2013-09-18: 10 mg via ORAL

## 2013-09-18 MED ORDER — IOHEXOL 180 MG/ML  SOLN
15.0000 mL | Freq: Once | INTRAMUSCULAR | Status: AC | PRN
  Administered 2013-09-18: 15 mL via INTRATHECAL

## 2013-09-18 NOTE — Discharge Instructions (Signed)
Myelogram Discharge Instructions  1. Go home and rest quietly for the next 24 hours.  It is important to lie flat for the next 24 hours.  Get up only to go to the restroom.  You may lie in the bed or on a couch on your back, your stomach, your left side or your right side.  You may have one pillow under your head.  You may have pillows between your knees while you are on your side or under your knees while you are on your back.  2. DO NOT drive today.  Recline the seat as far back as it will go, while still wearing your seat belt, on the way home.  3. You may get up to go to the bathroom as needed.  You may sit up for 10 minutes to eat.  You may resume your normal diet and medications unless otherwise indicated.  Drink lots of extra fluids today and tomorrow.  4. The incidence of headache, nausea, or vomiting is about 5% (one in 20 patients).  If you develop a headache, lie flat and drink plenty of fluids until the headache goes away.  Caffeinated beverages may be helpful.  If you develop severe nausea and vomiting or a headache that does not go away with flat bed rest, call [phone].  5. You may resume normal activities after your 24 hours of bed rest is over; however, do not exert yourself strongly or do any heavy lifting tomorrow. If when you get up you have a headache when standing, go back to bed and force fluids for another 24 hours.  6. Call your physician for a follow-up appointment.  The results of your myelogram will be sent directly to your physician by the following day.  7. If you have any questions or if complications develop after you arrive home, please call [phone].  Discharge instructions have been explained to the patient.  The patient, or the person responsible for the patient, fully understands these instructions.      May resume Celexa on September 19, 2013, after 11:00 am.

## 2013-09-18 NOTE — Progress Notes (Signed)
Pt states he has been off celexa for the past 4 days.  Dr. Gerilyn Pilgrim in to speak with pt. Discharge instructions explained to pt.

## 2013-09-24 ENCOUNTER — Other Ambulatory Visit (INDEPENDENT_AMBULATORY_CARE_PROVIDER_SITE_OTHER): Payer: Self-pay | Admitting: Surgery

## 2013-09-24 ENCOUNTER — Encounter (INDEPENDENT_AMBULATORY_CARE_PROVIDER_SITE_OTHER): Payer: Self-pay

## 2013-09-25 ENCOUNTER — Encounter (INDEPENDENT_AMBULATORY_CARE_PROVIDER_SITE_OTHER): Payer: Self-pay

## 2013-09-26 ENCOUNTER — Telehealth (INDEPENDENT_AMBULATORY_CARE_PROVIDER_SITE_OTHER): Payer: Self-pay | Admitting: General Surgery

## 2013-09-26 ENCOUNTER — Other Ambulatory Visit (INDEPENDENT_AMBULATORY_CARE_PROVIDER_SITE_OTHER): Payer: Self-pay | Admitting: General Surgery

## 2013-09-26 MED ORDER — OXYCODONE-ACETAMINOPHEN 5-325 MG PO TABS: 1.0000 | ORAL_TABLET | Freq: Four times a day (QID) | ORAL | Status: DC | PRN

## 2013-09-26 NOTE — Telephone Encounter (Signed)
Ok for someone in office to write for percocet 5/325  #30

## 2013-09-26 NOTE — Telephone Encounter (Signed)
Pt called to request pain med refill.  Has been waiting for cardiac clearance, now received.  On surgery schedule for 10/11/13.  Has only 3 Perocet left and is taking 3/day.  Please advise if he can get a refill.

## 2013-10-02 ENCOUNTER — Encounter (INDEPENDENT_AMBULATORY_CARE_PROVIDER_SITE_OTHER): Payer: Self-pay

## 2013-10-03 ENCOUNTER — Ambulatory Visit (INDEPENDENT_AMBULATORY_CARE_PROVIDER_SITE_OTHER): Payer: Medicaid Other | Admitting: Diagnostic Neuroimaging

## 2013-10-03 ENCOUNTER — Encounter: Payer: Self-pay | Admitting: Diagnostic Neuroimaging

## 2013-10-03 VITALS — BP 97/69 | HR 83 | Ht 67.0 in | Wt 248.0 lb

## 2013-10-03 DIAGNOSIS — F329 Major depressive disorder, single episode, unspecified: Secondary | ICD-10-CM

## 2013-10-03 DIAGNOSIS — F32A Depression, unspecified: Secondary | ICD-10-CM

## 2013-10-03 DIAGNOSIS — F3289 Other specified depressive episodes: Secondary | ICD-10-CM

## 2013-10-03 DIAGNOSIS — R413 Other amnesia: Secondary | ICD-10-CM

## 2013-10-03 DIAGNOSIS — I10 Essential (primary) hypertension: Secondary | ICD-10-CM | POA: Insufficient documentation

## 2013-10-03 DIAGNOSIS — F411 Generalized anxiety disorder: Secondary | ICD-10-CM

## 2013-10-03 DIAGNOSIS — S069XAA Unspecified intracranial injury with loss of consciousness status unknown, initial encounter: Secondary | ICD-10-CM | POA: Insufficient documentation

## 2013-10-03 DIAGNOSIS — I2510 Atherosclerotic heart disease of native coronary artery without angina pectoris: Secondary | ICD-10-CM

## 2013-10-03 DIAGNOSIS — S069X9A Unspecified intracranial injury with loss of consciousness of unspecified duration, initial encounter: Secondary | ICD-10-CM

## 2013-10-03 DIAGNOSIS — R42 Dizziness and giddiness: Secondary | ICD-10-CM

## 2013-10-03 DIAGNOSIS — F419 Anxiety disorder, unspecified: Secondary | ICD-10-CM

## 2013-10-03 DIAGNOSIS — E119 Type 2 diabetes mellitus without complications: Secondary | ICD-10-CM

## 2013-10-03 NOTE — Pre-Procedure Instructions (Addendum)
Cody [name]  10/03/2013   Your procedure is scheduled on:  Thursday October 11, 2013 at 9:00 AM.  Report to [facility] Short Stay Entrance "A"  Admitting at 6:00 AM.  Call this number if you have problems the morning of surgery: [phone]   Remember:   Do not eat food or drink liquids after midnight.   Take these medicines the morning of surgery with A SIP OF WATER: Albuterol inhaler if needed, Alprazolam (Xanax) if needed, Carvedilol (Coreg), Citalopram (Celexa), Omeprazole (Prilosec), Oxycodone (Percocet), Pantoprazole (Protonix)    Do NOT take any diabetic medications the morning of your surgery   Discontinue aspirin and herbal medications, Fish Oil 5 days before surgery   Do not wear jewelry.  Do not wear lotions, powders, or colognes.  Men may shave face and neck.  Do not bring valuables to the hospital.  [facility] is not responsible for any belongings or valuables.               Contacts, dentures or bridgework may not be worn into surgery.  Leave suitcase in the car. After surgery it may be brought to your room.  For patients admitted to the hospital, discharge time is determined by your treatment team.               Patients discharged the day of surgery will not be allowed to drive home.  Name and phone number of your driver: [employer]  Special Instructions: Please shower using CHG soap the night before and the morning of your surgery   Please read over the following fact sheets that you were given: Pain Booklet, Coughing and Deep Breathing and Surgical Site Infection Prevention

## 2013-10-03 NOTE — Patient Instructions (Signed)
I will check MRI brain.  Increase physical activity.  Try to use your CPAP machine for sleep apnea.  Cut down and quit smoking.
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
## 2013-10-04 ENCOUNTER — Encounter (HOSPITAL_COMMUNITY)
Admission: RE | Admit: 2013-10-04 | Discharge: 2013-10-04 | Disposition: A | Discharge status: Home / Self Care | Payer: Medicaid Other | Source: Ambulatory Visit | Attending: Surgery | Admitting: Surgery

## 2013-10-04 ENCOUNTER — Encounter (HOSPITAL_COMMUNITY): Payer: Self-pay

## 2013-10-04 DIAGNOSIS — Z01812 Encounter for preprocedural laboratory examination: Secondary | ICD-10-CM | POA: Insufficient documentation

## 2013-10-04 LAB — BASIC METABOLIC PANEL
BUN: 8 mg/dL (ref 6–23)
CO2: 25 mEq/L (ref 19–32)
CREATININE: 0.82 mg/dL (ref 0.50–1.35)
Calcium: 9.2 mg/dL (ref 8.4–10.5)
Chloride: 103 mEq/L (ref 96–112)
GFR calc non Af Amer: >90 mL/min (ref >90)
GLUCOSE: 108 mg/dL — AB (ref 70–99)
POTASSIUM: 5 meq/L (ref 3.7–5.3)
Sodium: 139 mEq/L (ref 137–147)

## 2013-10-04 LAB — CBC
HCT: 44.9 % (ref 39.0–52.0)
Hemoglobin: 14.7 g/dL (ref 13.0–17.0)
MCH: 29.8 pg (ref 26.0–34.0)
MCHC: 32.7 g/dL (ref 30.0–36.0)
MCV: 91.1 fL (ref 78.0–100.0)
Platelets: 214 10*3/uL (ref 150–400)
RBC: 4.93 MIL/uL (ref 4.22–5.81)
RDW: 16.1 % — AB (ref 11.5–15.5)
WBC: 6.8 10*3/uL (ref 4.0–10.5)

## 2013-10-04 NOTE — Progress Notes (Signed)
PCP is Dr Melford Aase Cardiologist is Dr Delaney Meigs Pt states that he had an EKG maybe last week. Request sent to Dr Lawerance Bach office for last office visit and last EKG, or any other heart test done. Pt voices understanding of pre-admit instructions, teach back complete. Ebony Hail to review chart due to cardiac hx.
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
## 2013-10-08 ENCOUNTER — Ambulatory Visit
Admission: RE | Admit: 2013-10-08 | Discharge: 2013-10-08 | Disposition: A | Discharge status: Home / Self Care | Payer: Medicaid Other | Source: Ambulatory Visit | Attending: Diagnostic Neuroimaging | Admitting: Diagnostic Neuroimaging

## 2013-10-08 DIAGNOSIS — R413 Other amnesia: Secondary | ICD-10-CM

## 2013-10-08 DIAGNOSIS — R42 Dizziness and giddiness: Secondary | ICD-10-CM

## 2013-10-10 MED ORDER — CEFAZOLIN SODIUM-DEXTROSE 2-3 GM-% IV SOLR
2.0000 g | INTRAVENOUS | Status: AC
  Administered 2013-10-11: 2 g via INTRAVENOUS
  Filled 2013-10-10: qty 50
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
## 2013-10-11 ENCOUNTER — Encounter (HOSPITAL_COMMUNITY): Payer: Medicaid Other | Admitting: Vascular Surgery

## 2013-10-11 ENCOUNTER — Ambulatory Visit (HOSPITAL_COMMUNITY): Payer: Medicaid Other | Admitting: Anesthesiology

## 2013-10-11 ENCOUNTER — Observation Stay (HOSPITAL_COMMUNITY)
Admission: RE | Admit: 2013-10-11 | Discharge: 2013-10-11 | Disposition: A | Discharge status: Home / Self Care | Payer: Medicaid Other | Source: Ambulatory Visit | Attending: Surgery | Admitting: Surgery

## 2013-10-11 ENCOUNTER — Encounter (HOSPITAL_COMMUNITY)
Admission: RE | Disposition: A | Discharge status: Home / Self Care | Payer: Self-pay | Source: Ambulatory Visit | Attending: Surgery

## 2013-10-11 ENCOUNTER — Encounter (HOSPITAL_COMMUNITY): Payer: Self-pay | Admitting: *Deleted

## 2013-10-11 DIAGNOSIS — G4730 Sleep apnea, unspecified: Secondary | ICD-10-CM | POA: Insufficient documentation

## 2013-10-11 DIAGNOSIS — K8020 Calculus of gallbladder without cholecystitis without obstruction: Secondary | ICD-10-CM | POA: Diagnosis present

## 2013-10-11 DIAGNOSIS — K8010 Calculus of gallbladder with chronic cholecystitis without obstruction: Secondary | ICD-10-CM

## 2013-10-11 DIAGNOSIS — Z96659 Presence of unspecified artificial knee joint: Secondary | ICD-10-CM | POA: Insufficient documentation

## 2013-10-11 DIAGNOSIS — I10 Essential (primary) hypertension: Secondary | ICD-10-CM | POA: Insufficient documentation

## 2013-10-11 DIAGNOSIS — M129 Arthropathy, unspecified: Secondary | ICD-10-CM | POA: Insufficient documentation

## 2013-10-11 DIAGNOSIS — R3915 Urgency of urination: Secondary | ICD-10-CM | POA: Insufficient documentation

## 2013-10-11 DIAGNOSIS — F329 Major depressive disorder, single episode, unspecified: Secondary | ICD-10-CM | POA: Insufficient documentation

## 2013-10-11 DIAGNOSIS — F3289 Other specified depressive episodes: Secondary | ICD-10-CM | POA: Insufficient documentation

## 2013-10-11 DIAGNOSIS — E119 Type 2 diabetes mellitus without complications: Secondary | ICD-10-CM | POA: Insufficient documentation

## 2013-10-11 DIAGNOSIS — F411 Generalized anxiety disorder: Secondary | ICD-10-CM | POA: Insufficient documentation

## 2013-10-11 DIAGNOSIS — F988 Other specified behavioral and emotional disorders with onset usually occurring in childhood and adolescence: Secondary | ICD-10-CM | POA: Insufficient documentation

## 2013-10-11 DIAGNOSIS — I252 Old myocardial infarction: Secondary | ICD-10-CM | POA: Insufficient documentation

## 2013-10-11 DIAGNOSIS — E785 Hyperlipidemia, unspecified: Secondary | ICD-10-CM | POA: Insufficient documentation

## 2013-10-11 DIAGNOSIS — I2510 Atherosclerotic heart disease of native coronary artery without angina pectoris: Secondary | ICD-10-CM | POA: Insufficient documentation

## 2013-10-11 DIAGNOSIS — R51 Headache: Secondary | ICD-10-CM | POA: Insufficient documentation

## 2013-10-11 DIAGNOSIS — K219 Gastro-esophageal reflux disease without esophagitis: Secondary | ICD-10-CM | POA: Insufficient documentation

## 2013-10-11 DIAGNOSIS — F17200 Nicotine dependence, unspecified, uncomplicated: Secondary | ICD-10-CM | POA: Insufficient documentation

## 2013-10-11 DIAGNOSIS — R609 Edema, unspecified: Secondary | ICD-10-CM | POA: Insufficient documentation

## 2013-10-11 DIAGNOSIS — Z9861 Coronary angioplasty status: Secondary | ICD-10-CM | POA: Insufficient documentation

## 2013-10-11 HISTORY — PX: CHOLECYSTECTOMY: SHX55

## 2013-10-11 LAB — GLUCOSE, CAPILLARY
Glucose-Capillary: 120 mg/dL — ABNORMAL HIGH (ref 70–99)
Glucose-Capillary: 95 mg/dL (ref 70–99)
Glucose-Capillary: 165 mg/dL — ABNORMAL HIGH (ref 70–99)
Glucose-Capillary: 161 mg/dL — ABNORMAL HIGH (ref 70–99)

## 2013-10-11 SURGERY — LAPAROSCOPIC CHOLECYSTECTOMY
Anesthesia: General | Site: Abdomen

## 2013-10-11 MED ORDER — ALBUTEROL SULFATE (2.5 MG/3ML) 0.083% IN NEBU: 3.0000 mL | INHALATION_SOLUTION | Freq: Four times a day (QID) | RESPIRATORY_TRACT | Status: DC | PRN

## 2013-10-11 MED ORDER — BUPIVACAINE-EPINEPHRINE 0.25% -1:200000 IJ SOLN
INTRAMUSCULAR | Status: DC | PRN
  Administered 2013-10-11: 20 mL

## 2013-10-11 MED ORDER — OXYCODONE HCL 5 MG PO TABS
ORAL_TABLET | ORAL | Status: AC
  Filled 2013-10-11: qty 1

## 2013-10-11 MED ORDER — MIDAZOLAM HCL 5 MG/5ML IJ SOLN
INTRAMUSCULAR | Status: DC | PRN
  Administered 2013-10-11: 2 mg via INTRAVENOUS

## 2013-10-11 MED ORDER — BUPIVACAINE-EPINEPHRINE PF 0.25-1:200000 % IJ SOLN
INTRAMUSCULAR | Status: AC
  Filled 2013-10-11: qty 30

## 2013-10-11 MED ORDER — IBUPROFEN 600 MG PO TABS: 600.0000 mg | ORAL_TABLET | Freq: Four times a day (QID) | ORAL | Status: DC | PRN

## 2013-10-11 MED ORDER — GLYCOPYRROLATE 0.2 MG/ML IJ SOLN
INTRAMUSCULAR | Status: DC | PRN
  Administered 2013-10-11: 0.4 mg via INTRAVENOUS

## 2013-10-11 MED ORDER — HYDROMORPHONE HCL PF 1 MG/ML IJ SOLN
INTRAMUSCULAR | Status: AC
  Filled 2013-10-11: qty 1

## 2013-10-11 MED ORDER — CARVEDILOL 3.125 MG PO TABS: 3.1250 mg | ORAL_TABLET | Freq: Two times a day (BID) | ORAL | Status: DC

## 2013-10-11 MED ORDER — MIDAZOLAM HCL 2 MG/2ML IJ SOLN
INTRAMUSCULAR | Status: AC
  Filled 2013-10-11: qty 2

## 2013-10-11 MED ORDER — LACTATED RINGERS IV SOLN
INTRAVENOUS | Status: DC
  Administered 2013-10-11: 07:00:00 via INTRAVENOUS

## 2013-10-11 MED ORDER — DEXAMETHASONE SODIUM PHOSPHATE 4 MG/ML IJ SOLN
INTRAMUSCULAR | Status: AC
  Filled 2013-10-11: qty 1

## 2013-10-11 MED ORDER — PROPOFOL 10 MG/ML IV BOLUS
INTRAVENOUS | Status: AC
  Filled 2013-10-11: qty 20

## 2013-10-11 MED ORDER — 0.9 % SODIUM CHLORIDE (POUR BTL) OPTIME
TOPICAL | Status: DC | PRN
  Administered 2013-10-11: 1000 mL

## 2013-10-11 MED ORDER — ONDANSETRON HCL 4 MG/2ML IJ SOLN: 4.0000 mg | Freq: Four times a day (QID) | INTRAMUSCULAR | Status: DC | PRN

## 2013-10-11 MED ORDER — POTASSIUM CHLORIDE IN NACL 20-0.9 MEQ/L-% IV SOLN
INTRAVENOUS | Status: DC
  Administered 2013-10-11: 14:00:00 via INTRAVENOUS
  Filled 2013-10-11: qty 1000

## 2013-10-11 MED ORDER — ONDANSETRON HCL 4 MG PO TABS
4.0000 mg | ORAL_TABLET | Freq: Four times a day (QID) | ORAL | Status: DC | PRN
Start: 2013-10-11 — End: 2013-10-11

## 2013-10-11 MED ORDER — MORPHINE SULFATE 2 MG/ML IJ SOLN
1.0000 mg | INTRAMUSCULAR | Status: DC | PRN
Start: 2013-10-11 — End: 2013-10-11

## 2013-10-11 MED ORDER — SODIUM CHLORIDE 0.9 % IR SOLN
Status: DC | PRN
  Administered 2013-10-11: 1

## 2013-10-11 MED ORDER — METOCLOPRAMIDE HCL 5 MG/ML IJ SOLN: 10.0000 mg | Freq: Once | INTRAMUSCULAR | Status: DC | PRN

## 2013-10-11 MED ORDER — FUROSEMIDE 40 MG PO TABS
40.0000 mg | ORAL_TABLET | Freq: Every day | ORAL | Status: DC
  Administered 2013-10-11: 40 mg via ORAL
  Filled 2013-10-11: qty 1

## 2013-10-11 MED ORDER — FENTANYL CITRATE 0.05 MG/ML IJ SOLN
INTRAMUSCULAR | Status: DC | PRN
  Administered 2013-10-11: 100 ug via INTRAVENOUS

## 2013-10-11 MED ORDER — DEXAMETHASONE SODIUM PHOSPHATE 4 MG/ML IJ SOLN
INTRAMUSCULAR | Status: DC | PRN
Start: 2013-10-11 — End: 2013-10-11
  Administered 2013-10-11: 4 mg via INTRAVENOUS

## 2013-10-11 MED ORDER — METFORMIN HCL 500 MG PO TABS: 1000.0000 mg | ORAL_TABLET | Freq: Two times a day (BID) | ORAL | Status: DC

## 2013-10-11 MED ORDER — OXYCODONE HCL 5 MG PO TABS
5.0000 mg | ORAL_TABLET | Freq: Once | ORAL | Status: AC | PRN
  Administered 2013-10-11: 5 mg via ORAL

## 2013-10-11 MED ORDER — OXYCODONE-ACETAMINOPHEN 10-325 MG PO TABS: 1.0000 | ORAL_TABLET | ORAL | Status: DC | PRN

## 2013-10-11 MED ORDER — OXYCODONE-ACETAMINOPHEN 5-325 MG PO TABS
1.0000 | ORAL_TABLET | ORAL | Status: DC | PRN
  Administered 2013-10-11: 2 via ORAL
  Filled 2013-10-11: qty 2

## 2013-10-11 MED ORDER — LIDOCAINE HCL (CARDIAC) 20 MG/ML IV SOLN
INTRAVENOUS | Status: AC
  Filled 2013-10-11: qty 5

## 2013-10-11 MED ORDER — PROPOFOL 10 MG/ML IV BOLUS
INTRAVENOUS | Status: DC | PRN
  Administered 2013-10-11: 200 mg via INTRAVENOUS

## 2013-10-11 MED ORDER — ONDANSETRON HCL 4 MG/2ML IJ SOLN
INTRAMUSCULAR | Status: DC | PRN
  Administered 2013-10-11: 4 mg via INTRAVENOUS

## 2013-10-11 MED ORDER — PANTOPRAZOLE SODIUM 40 MG PO TBEC: 40.0000 mg | DELAYED_RELEASE_TABLET | Freq: Every day | ORAL | Status: DC

## 2013-10-11 MED ORDER — NEOSTIGMINE METHYLSULFATE 1 MG/ML IJ SOLN
INTRAMUSCULAR | Status: DC | PRN
  Administered 2013-10-11: 3 mg via INTRAVENOUS

## 2013-10-11 MED ORDER — FENTANYL CITRATE 0.05 MG/ML IJ SOLN
INTRAMUSCULAR | Status: AC
  Filled 2013-10-11: qty 5

## 2013-10-11 MED ORDER — ENOXAPARIN SODIUM 40 MG/0.4ML ~~LOC~~ SOLN: 40.0000 mg | SUBCUTANEOUS | Status: DC

## 2013-10-11 MED ORDER — ROCURONIUM BROMIDE 100 MG/10ML IV SOLN
INTRAVENOUS | Status: DC | PRN
  Administered 2013-10-11: 40 mg via INTRAVENOUS

## 2013-10-11 MED ORDER — LIDOCAINE HCL (CARDIAC) 20 MG/ML IV SOLN
INTRAVENOUS | Status: DC | PRN
  Administered 2013-10-11: 60 mg via INTRAVENOUS

## 2013-10-11 MED ORDER — OXYCODONE HCL 5 MG/5ML PO SOLN: 5.0000 mg | Freq: Once | ORAL | Status: AC | PRN

## 2013-10-11 MED ORDER — METFORMIN HCL 500 MG PO TABS
1000.0000 mg | ORAL_TABLET | Freq: Two times a day (BID) | ORAL | Status: DC
  Administered 2013-10-11: 1000 mg via ORAL
  Filled 2013-10-11: qty 2

## 2013-10-11 MED ORDER — GLIPIZIDE 10 MG PO TABS: 10.0000 mg | ORAL_TABLET | Freq: Two times a day (BID) | ORAL | Status: DC

## 2013-10-11 MED ORDER — HYDROMORPHONE HCL PF 1 MG/ML IJ SOLN
0.2500 mg | INTRAMUSCULAR | Status: DC | PRN
  Administered 2013-10-11 (×2): 1 mg via INTRAVENOUS

## 2013-10-11 MED ORDER — RAMIPRIL 5 MG PO CAPS
5.0000 mg | ORAL_CAPSULE | Freq: Two times a day (BID) | ORAL | Status: DC
  Administered 2013-10-11: 5 mg via ORAL
  Filled 2013-10-11 (×2): qty 1

## 2013-10-11 SURGICAL SUPPLY — 44 items
APL SKNCLS STERI-STRIP NONHPOA (GAUZE/BANDAGES/DRESSINGS) ×1
APPLIER CLIP 5 13 M/L LIGAMAX5 (MISCELLANEOUS) ×3
APR CLP MED LRG 5 ANG JAW (MISCELLANEOUS) ×1
BAG SPEC RTRVL LRG 6X4 10 (ENDOMECHANICALS)
BANDAGE ADHESIVE 1X3 (GAUZE/BANDAGES/DRESSINGS) ×12 IMPLANT
BENZOIN TINCTURE PRP APPL 2/3 (GAUZE/BANDAGES/DRESSINGS) ×3 IMPLANT
CANISTER SUCTION 2500CC (MISCELLANEOUS) ×3 IMPLANT
CHLORAPREP W/TINT 26ML (MISCELLANEOUS) ×3 IMPLANT
CLIP APPLIE 5 13 M/L LIGAMAX5 (MISCELLANEOUS) ×1 IMPLANT
COVER MAYO STAND STRL (DRAPES) IMPLANT
COVER SURGICAL LIGHT HANDLE (MISCELLANEOUS) ×3 IMPLANT
DECANTER SPIKE VIAL GLASS SM (MISCELLANEOUS) ×3 IMPLANT
DRAPE C-ARM 42X72 X-RAY (DRAPES) IMPLANT
DRAPE UTILITY 15X26 W/TAPE STR (DRAPE) ×6 IMPLANT
ELECT REM PT RETURN 9FT ADLT (ELECTROSURGICAL) ×3
ELECTRODE REM PT RTRN 9FT ADLT (ELECTROSURGICAL) ×1 IMPLANT
GLOVE BIO SURGEON STRL SZ7 (GLOVE) ×2 IMPLANT
GLOVE BIO SURGEON STRL SZ7.5 (GLOVE) ×2 IMPLANT
GLOVE BIOGEL PI IND STRL 7.0 (GLOVE) IMPLANT
GLOVE BIOGEL PI IND STRL 7.5 (GLOVE) IMPLANT
GLOVE BIOGEL PI INDICATOR 7.0 (GLOVE) ×2
GLOVE BIOGEL PI INDICATOR 7.5 (GLOVE) ×2
GLOVE SURG SIGNA 7.5 PF LTX (GLOVE) ×3 IMPLANT
GOWN STRL REUS W/ TWL LRG LVL3 (GOWN DISPOSABLE) ×3 IMPLANT
GOWN STRL REUS W/ TWL XL LVL3 (GOWN DISPOSABLE) ×1 IMPLANT
GOWN STRL REUS W/TWL LRG LVL3 (GOWN DISPOSABLE) ×9
GOWN STRL REUS W/TWL XL LVL3 (GOWN DISPOSABLE) ×3
KIT BASIN OR (CUSTOM PROCEDURE TRAY) ×3 IMPLANT
KIT ROOM TURNOVER OR (KITS) ×3 IMPLANT
NS IRRIG 1000ML POUR BTL (IV SOLUTION) ×3 IMPLANT
PAD ARMBOARD 7.5X6 YLW CONV (MISCELLANEOUS) ×3 IMPLANT
POUCH SPECIMEN RETRIEVAL 10MM (ENDOMECHANICALS) IMPLANT
SCISSORS LAP 5X35 DISP (ENDOMECHANICALS) ×3 IMPLANT
SET CHOLANGIOGRAPH 5 50 .035 (SET/KITS/TRAYS/PACK) IMPLANT
SET IRRIG TUBING LAPAROSCOPIC (IRRIGATION / IRRIGATOR) ×3 IMPLANT
SLEEVE ENDOPATH XCEL 5M (ENDOMECHANICALS) ×6 IMPLANT
SPECIMEN JAR SMALL (MISCELLANEOUS) ×3 IMPLANT
SUT MON AB 4-0 PC3 18 (SUTURE) ×3 IMPLANT
TOWEL OR 17X24 6PK STRL BLUE (TOWEL DISPOSABLE) ×3 IMPLANT
TOWEL OR 17X26 10 PK STRL BLUE (TOWEL DISPOSABLE) ×3 IMPLANT
TRAY LAPAROSCOPIC (CUSTOM PROCEDURE TRAY) ×3 IMPLANT
TROCAR XCEL BLUNT TIP 100MML (ENDOMECHANICALS) ×3 IMPLANT
TROCAR XCEL NON-BLD 5MMX100MML (ENDOMECHANICALS) ×3 IMPLANT
WATER STERILE IRR 1000ML POUR (IV SOLUTION) IMPLANT

## 2013-10-11 NOTE — Interval H&P Note (Signed)
History and Physical Interval Note: no change in H and P  10/11/2013 6:51 AM  Cody [name]  has presented today for surgery, with the diagnosis of gall stones   The various methods of treatment have been discussed with the patient and family. After consideration of risks, benefits and other options for treatment, the patient has consented to  Procedure(s): LAPAROSCOPIC CHOLECYSTECTOMY  (N/A) as a surgical intervention .  [name] history has been reviewed, patient examined, no change in status, stable for surgery.  I have reviewed the patient's chart and labs.  Questions were answered to the patient's satisfaction.     Harl Bowie

## 2013-10-11 NOTE — Op Note (Signed)
Laparoscopic Cholecystectomy Procedure Note  Indications: This patient presents with symptomatic gallbladder disease and will undergo laparoscopic cholecystectomy.  Pre-operative Diagnosis: Calculus of gallbladder without mention of cholecystitis or obstruction  Post-operative Diagnosis: Same  Surgeon: Harl Bowie   Assistants: 0  Anesthesia: General endotracheal anesthesia  ASA Class: 3  Procedure Details  The patient was seen again in the Holding Room. The risks, benefits, complications, treatment options, and expected outcomes were discussed with the patient. The possibilities of reaction to medication, pulmonary aspiration, perforation of viscus, bleeding, recurrent infection, finding a normal gallbladder, the need for additional procedures, failure to diagnose a condition, the possible need to convert to an open procedure, and creating a complication requiring transfusion or operation were discussed with the patient. The likelihood of improving the patient's symptoms with return to their baseline status is good.  The patient and/or family concurred with the proposed plan, giving informed consent. The site of surgery properly noted. The patient was taken to Operating Room, identified as KARON HECKENDORN and the procedure verified as Laparoscopic Cholecystectomy with Intraoperative Cholangiogram. A Time Out was held and the above information confirmed.  Prior to the induction of general anesthesia, antibiotic prophylaxis was administered. General endotracheal anesthesia was then administered and tolerated well. After the induction, the abdomen was prepped with Chloraprep and draped in sterile fashion. The patient was positioned in the supine position.  Local anesthetic agent was injected into the skin near the umbilicus and an incision made. We dissected down to the abdominal fascia with blunt dissection.  The fascia was incised vertically and we entered the peritoneal cavity bluntly.  A  pursestring suture of 0-Vicryl was placed around the fascial opening.  The Hasson cannula was inserted and secured with the stay suture.  Pneumoperitoneum was then created with CO2 and tolerated well without any adverse changes in the patient's vital signs. An 11-mm port was placed in the subxiphoid position.  Two 5-mm ports were placed in the right upper quadrant. All skin incisions were infiltrated with a local anesthetic agent before making the incision and placing the trocars.   We positioned the patient in reverse Trendelenburg, tilted slightly to the patient's left.  The gallbladder was identified, the fundus grasped and retracted cephalad. Adhesions were lysed bluntly and with the electrocautery where indicated, taking care not to injure any adjacent organs or viscus. The infundibulum was grasped and retracted laterally, exposing the peritoneum overlying the triangle of Calot. This was then divided and exposed in a blunt fashion. The cystic duct was clearly identified and bluntly dissected circumferentially. A critical view of the cystic duct and cystic artery was obtained.  The cystic duct was then ligated with clips and divided. The cystic artery was, dissected free, ligated with clips and divided as well.   The gallbladder was dissected from the liver bed in retrograde fashion with the electrocautery. The gallbladder was removed and placed in an Endocatch sac. The liver bed was irrigated and inspected. Hemostasis was achieved with the electrocautery. Copious irrigation was utilized and was repeatedly aspirated until clear.  The gallbladder and Endocatch sac were then removed through the umbilical port site.  The pursestring suture was used to close the umbilical fascia.    We again inspected the right upper quadrant for hemostasis.  Pneumoperitoneum was released as we removed the trocars.  4-0 Monocryl was used to close the skin.   Benzoin, steri-strips, and clean dressings were applied. The patient  was then extubated and brought to the recovery  room in stable condition. Instrument, sponge, and needle counts were correct at closure and at the conclusion of the case.   Findings: Chronic Cholecystitis with Cholelithiasis  Estimated Blood Loss: Minimal         Drains: 0         Specimens: Gallbladder           Complications: None; patient tolerated the procedure well.         Disposition: PACU - hemodynamically stable.         Condition: stable

## 2013-10-11 NOTE — Anesthesia Procedure Notes (Signed)
Procedure Name: Intubation Date/Time: 10/11/2013 9:00 AM Performed by: Julian Reil Pre-anesthesia Checklist: Patient identified, Emergency Drugs available, Suction available and Patient being monitored Patient Re-evaluated:Patient Re-evaluated prior to inductionOxygen Delivery Method: Circle system utilized Preoxygenation: Pre-oxygenation with 100% oxygen Intubation Type: IV induction Ventilation: Mask ventilation without difficulty Grade View: Grade II Tube type: Oral Tube size: 7.5 mm Number of attempts: 2 Airway Equipment and Method: Video-laryngoscopy and Stylet Placement Confirmation: ETT inserted through vocal cords under direct vision,  positive ETCO2 and breath sounds checked- equal and bilateral Secured at: 23 cm Tube secured with: Tape Dental Injury: Teeth and Oropharynx as per pre-operative assessment  Difficulty Due To: Difficulty was anticipated, Difficult Airway- due to limited oral opening, Difficult Airway- due to dentition and Difficult Airway- due to large tongue Comments: Easy mask airway. DL x 1 with MAC 4 blade. Grade III view, esophageal intubation immediately recognized. DL with Glidescope, grade II view. Atraumatic oral intubation.

## 2013-10-11 NOTE — Anesthesia Postprocedure Evaluation (Signed)
Anesthesia Post Note  Patient: Cody [name]  Procedure(s) Performed: Procedure(s) (LRB): LAPAROSCOPIC CHOLECYSTECTOMY  (N/A)  Anesthesia type: General  Patient location: PACU  Post pain: Pain level controlled  Post assessment: Patient's Cardiovascular Status Stable  Last Vitals:  Filed Vitals:   10/11/13 1045  BP: 112/96  Pulse: 62  Temp: 36.4 C  Resp: 16    Post vital signs: Reviewed and stable  Level of consciousness: alert  Complications: No apparent anesthesia complications

## 2013-10-11 NOTE — Anesthesia Preprocedure Evaluation (Signed)
Anesthesia Evaluation  Patient identified by MRN, date of birth, ID band Patient awake    Reviewed: Allergy & Precautions, H&P , NPO status , Patient's Chart, lab work & pertinent test results, reviewed documented beta blocker date and time   Airway Mallampati: II TM Distance: >3 FB Neck ROM: full    Dental   Pulmonary neg pulmonary ROS, shortness of breath, sleep apnea , pneumonia -, resolved, Current Smoker,  breath sounds clear to auscultation        Cardiovascular hypertension, On Medications and On Home Beta Blockers + CAD, + Past MI, + Cardiac Stents and + DOE Rhythm:regular     Neuro/Psych  Headaches, PSYCHIATRIC DISORDERS    GI/Hepatic Neg liver ROS, GERD-  Medicated and Controlled,  Endo/Other  diabetes, Oral Hypoglycemic Agents  Renal/GU negative Renal ROS  negative genitourinary   Musculoskeletal   Abdominal   Peds  Hematology negative hematology ROS (+)   Anesthesia Other Findings See surgeon's H&P   Reproductive/Obstetrics negative OB ROS                           Anesthesia Physical Anesthesia Plan  ASA: III  Anesthesia Plan: General   Post-op Pain Management:    Induction: Intravenous  Airway Management Planned: Oral ETT  Additional Equipment:   Intra-op Plan:   Post-operative Plan: Extubation in OR  Informed Consent: I have reviewed the patients History and Physical, chart, labs and discussed the procedure including the risks, benefits and alternatives for the proposed anesthesia with the patient or authorized representative who has indicated his/her understanding and acceptance.   Dental Advisory Given  Plan Discussed with: CRNA and Surgeon  Anesthesia Plan Comments:         Anesthesia Quick Evaluation

## 2013-10-11 NOTE — Discharge Instructions (Signed)
[facility] ______[facility] LAPAROSCOPIC SURGERY: POST OP INSTRUCTIONS Always review your discharge instruction sheet given to you by the facility where your surgery was performed. IF YOU HAVE DISABILITY OR FAMILY LEAVE FORMS, YOU MUST BRING THEM TO THE OFFICE FOR PROCESSING.   DO NOT GIVE THEM TO YOUR DOCTOR.  1. A prescription for pain medication may be given to you upon discharge.  Take your pain medication as prescribed, if needed.  If narcotic pain medicine is not needed, then you may take acetaminophen (Tylenol) or ibuprofen (Advil) as needed. 2. Take your usually prescribed medications unless otherwise directed. 3. If you need a refill on your pain medication, please contact your pharmacy.  They will contact our office to request authorization. Prescriptions will not be filled after 5pm or on week-ends. 4. You should follow a light diet the first few days after arrival home, such as soup and crackers, etc.  Be sure to include lots of fluids daily. 5. Most patients will experience some swelling and bruising in the area of the incisions.  Ice packs will help.  Swelling and bruising can take several days to resolve.  6. It is common to experience some constipation if taking pain medication after surgery.  Increasing fluid intake and taking a stool softener (such as Colace) will usually help or prevent this problem from occurring.  A mild laxative (Milk of Magnesia or Miralax) should be taken according to package instructions if there are no bowel movements after 48 hours. 7. Unless discharge instructions indicate otherwise, you may remove your bandages 24-48 hours after surgery, and you may shower at that time.  You may have steri-strips (small skin tapes) in place directly over the incision.  These strips should be left on the skin for 7-10 days.  If your surgeon used skin glue on the incision, you may shower in 24 hours.  The glue will flake off over the next 2-3 weeks.  Any sutures or  staples will be removed at the office during your follow-up visit. 8. ACTIVITIES:  You may resume regular (light) daily activities beginning the next day--such as daily self-care, walking, climbing stairs--gradually increasing activities as tolerated.  You may have sexual intercourse when it is comfortable.  Refrain from any heavy lifting or straining until approved by your doctor. a. You may drive when you are no longer taking prescription pain medication, you can comfortably wear a seatbelt, and you can safely maneuver your car and apply brakes. b. RETURN TO WORK:  __________________________________________________________ 9. You should see your doctor in the office for a follow-up appointment approximately 2-3 weeks after your surgery.  Make sure that you call for this appointment within a day or two after you arrive home to insure a convenient appointment time. 10. OTHER INSTRUCTIONS: ____USE CPAP TONIGHT 11. ICE PACK ALSO FOR PAIN 12. NO LIFTING MORE THAN 15 POUNDS FOR 3 WEEKS. 13. ______________________________________________________________________________________________________________________ __________________________________________________________________________________________________________________________ WHEN TO CALL YOUR DOCTOR: 1. Fever over 101.0 2. Inability to urinate 3. Continued bleeding from incision. 4. Increased pain, redness, or drainage from the incision. 5. Increasing abdominal pain  The clinic staff is available to answer your questions during regular business hours.  Please dont hesitate to call and ask to speak to one of the nurses for clinical concerns.  If you have a medical emergency, go to the nearest emergency room or call 911.  A surgeon from [facility] is always on call at the hospital. [address] [place], [place], [place]  [address] ? [address], [place], [place]   [address] [phone] ? [phone] ? FAX [phone] Web  site: [email or web address]

## 2013-10-11 NOTE — Transfer of Care (Signed)
Immediate Anesthesia Transfer of Care Note  Patient: Cody [name]  Procedure(s) Performed: Procedure(s): LAPAROSCOPIC CHOLECYSTECTOMY  (N/A)  Patient Location: PACU  Anesthesia Type:General  Level of Consciousness: awake, alert , oriented and patient cooperative  Airway & Oxygen Therapy: Patient Spontanous Breathing and Patient connected to face mask oxygen  Post-op Assessment: Report given to PACU RN, Post -op Vital signs reviewed and stable and Patient moving all extremities  Post vital signs: Reviewed and stable  Complications: No apparent anesthesia complications

## 2013-10-11 NOTE — Discharge Planning (Signed)
Copy of AVS and rx to patient who verbalizes understanding. Has tolerated soft food, voided x 2 and percocet is relieving pain.  At 1600 dc'd per wc to private car home with all personal belongings, acomp. By family.

## 2013-10-11 NOTE — Progress Notes (Signed)
Patient ID: Cody [name], male   DOB: 10-10-1965, 48 y.o.   MRN: [id number]  He is doing very well and wants to go home He's tolerating po He promises to use his CPAP device  Will discharge

## 2013-10-11 NOTE — Discharge Summary (Signed)
Physician Discharge Summary  Patient ID: Cody [name] MRN: [id number] DOB/AGE: 46-May-1967 46 y.o.  Admit date: 10/11/2013 Discharge date: 10/11/2013  Admission Diagnoses:  Discharge Diagnoses:  Active Problems:   Symptomatic cholelithiasis   Discharged Condition: good  Hospital Course: Uneventful post op recovery  Consults: None  Significant Diagnostic Studies:   Treatments: surgery: lap chole  Discharge Exam: Blood pressure 127/79, pulse 62, temperature 97.7 F (36.5 C), temperature source Oral, resp. rate 16, height 5\' 7"  (1.702 m), weight 245 lb (111.131 kg), SpO2 95.00%. General appearance: alert, cooperative and no distress Resp: clear to auscultation bilaterally Cardio: regular rate and rhythm, S1, S2 normal, no murmur, click, rub or gallop Incision/Wound:clean  Disposition: 01-Home or Self Care   Future Appointments Provider Department Dept Phone   01/04/2014 11:30 AM Penni Bombard, MD [facility] [phone]       Medication List         albuterol 108 (90 BASE) MCG/ACT inhaler  Commonly known as:  PROVENTIL HFA;VENTOLIN HFA  Inhale 2 puffs into the lungs every 6 (six) hours as needed for wheezing.     ALPRAZolam 1 MG tablet  Commonly known as:  XANAX  Take 0.5 mg by mouth 2 (two) times daily as needed for anxiety.     aspirin 81 MG chewable tablet  Chew 81 mg by mouth 2 (two) times daily.     atorvastatin 40 MG tablet  Commonly known as:  LIPITOR  Take 40 mg by mouth every evening.     calcium carbonate 1250 MG tablet  Commonly known as:  OS-CAL - dosed in mg of elemental calcium  Take 1 tablet by mouth at bedtime.     carvedilol 3.125 MG tablet  Commonly known as:  COREG  Take 1 tablet (3.125 mg total) by mouth 2 (two) times daily with a meal.     citalopram 40 MG tablet  Commonly known as:  CELEXA  Take 40 mg by mouth every evening. PM     dicyclomine 20 MG tablet  Commonly known as:  BENTYL  Take 20 mg by mouth 2  (two) times daily as needed for spasms.     FISH OIL PO  Take 1 tablet by mouth daily.     furosemide 40 MG tablet  Commonly known as:  LASIX  Take 40 mg by mouth daily.     glipiZIDE 10 MG tablet  Commonly known as:  GLUCOTROL  Take 10 mg by mouth 2 (two) times daily before a meal.     ibuprofen 200 MG tablet  Commonly known as:  ADVIL,MOTRIN  Take 200 mg by mouth every 6 (six) hours as needed for moderate pain.     metFORMIN 1000 MG tablet  Commonly known as:  GLUCOPHAGE  Take 1,000 mg by mouth 2 (two) times daily. Patient takes 1000 mg in morning and 1000mg  in evening, daily.     multivitamin with minerals Tabs tablet  Take 1 tablet by mouth daily.     nitroGLYCERIN 0.4 MG SL tablet  Commonly known as:  NITROSTAT  Place 1 tablet (0.4 mg total) under the tongue every 5 (five) minutes x 3 doses as needed for chest pain.     omeprazole 40 MG capsule  Commonly known as:  PRILOSEC  Take 80 mg by mouth at bedtime.     oxyCODONE-acetaminophen 10-325 MG per tablet  Commonly known as:  PERCOCET  Take 1 tablet by mouth every 4 (four) hours as needed for  pain.     pantoprazole 40 MG tablet  Commonly known as:  PROTONIX  Take 40 mg by mouth daily.     potassium chloride SA 20 MEQ tablet  Commonly known as:  K-DUR,KLOR-CON  Take 20 mEq by mouth daily.     ramipril 5 MG capsule  Commonly known as:  ALTACE  Take 5 mg by mouth 2 (two) times daily.     sitaGLIPtin 100 MG tablet  Commonly known as:  JANUVIA  Take 100 mg by mouth every morning.     triamcinolone ointment 0.5 %  Commonly known as:  KENALOG  Apply 1 application topically 2 (two) times daily as needed (dry skin).           Follow-up Information   Follow up with [facility] A, MD. Schedule an appointment as soon as possible for a visit in 3 weeks.   Specialty:  General Surgery   Contact information:   [address] [place] [address] [phone]       Signed: Harl Bowie 10/11/2013, 3:15 PM

## 2013-10-12 ENCOUNTER — Encounter (HOSPITAL_COMMUNITY): Payer: Self-pay | Admitting: Surgery

## 2013-10-15 ENCOUNTER — Telehealth (INDEPENDENT_AMBULATORY_CARE_PROVIDER_SITE_OTHER): Payer: Self-pay

## 2013-10-15 NOTE — Telephone Encounter (Signed)
Returned pt's call. I notified him that Dr Ninfa Linden did respond to the message. Per Dr Ninfa Linden ok to have injections now if he wants it will have no effect on the gallbladder surgery. The pt understands.

## 2013-10-15 NOTE — Telephone Encounter (Signed)
Pt is a s/p lap chole done on 4/30 by Dr Ninfa Linden. The pt is wanting to know if he can get his back injections for his bulging disc issues with Dr Murphy/Wainer. Their office advised him to call us b/c they wanted to make sure the epidermal injections that they would give the pt would not interfere with healing from the gallbladder sx. The pt has a f/u appt with Dr Ninfa Linden on 10/30/13 so he said he could wait if he needed to in order to get cleared for the injections. Please advise.

## 2013-10-15 NOTE — Telephone Encounter (Signed)
He can have his injections now if he wants. It will have no effect on his gallbladder surgery

## 2013-10-26 ENCOUNTER — Telehealth: Payer: Self-pay | Admitting: Diagnostic Neuroimaging

## 2013-10-26 NOTE — Telephone Encounter (Signed)
Patient calling to get MRI results.  Please return call.  Thanks

## 2013-10-30 ENCOUNTER — Encounter (INDEPENDENT_AMBULATORY_CARE_PROVIDER_SITE_OTHER): Payer: Self-pay | Admitting: Surgery

## 2013-10-30 ENCOUNTER — Ambulatory Visit (INDEPENDENT_AMBULATORY_CARE_PROVIDER_SITE_OTHER): Payer: Medicaid Other | Admitting: Surgery

## 2013-10-30 VITALS — BP 127/80 | HR 72 | Temp 97.7°F | Resp 16 | Ht 67.0 in | Wt 243.4 lb

## 2013-10-30 DIAGNOSIS — Z9889 Other specified postprocedural states: Secondary | ICD-10-CM

## 2013-10-30 NOTE — Progress Notes (Signed)
Subjective:     Patient ID: Cody [name], male   DOB: 06-Sep-1965, 48 y.o.   MRN: [id number]  HPI He is here for his first post op visit s/p lap chole.  He is doing fairly well expect for incisional pain at the umbilicus.  Review of Systems     Objective:   Physical Exam His abdomen is soft and his incisions are well healed    Assessment:     Stable post op     Plan:     He may resume normal activity and will see [place] back PRN

## 2013-10-30 NOTE — Telephone Encounter (Signed)
Pt calling requesting MRI results. Please advise °

## 2013-11-01 ENCOUNTER — Telehealth: Payer: Self-pay | Admitting: Diagnostic Neuroimaging

## 2013-11-01 NOTE — Telephone Encounter (Signed)
Calling for his MRI results please call

## 2013-11-01 NOTE — Telephone Encounter (Signed)
Pt calling requesting MRI results. Please advise °

## 2013-11-09 NOTE — Telephone Encounter (Signed)
I called patient with MRI results. Nothing acute. Continue current plan. -VRP

## 2014-01-04 ENCOUNTER — Ambulatory Visit: Payer: Medicaid Other | Admitting: Diagnostic Neuroimaging

## 2014-02-28 ENCOUNTER — Ambulatory Visit: Payer: Medicaid Other | Admitting: Diagnostic Neuroimaging

## 2014-05-23 ENCOUNTER — Encounter (HOSPITAL_COMMUNITY): Payer: Self-pay | Admitting: Cardiology

## 2014-10-16 ENCOUNTER — Other Ambulatory Visit: Payer: Self-pay | Admitting: Cardiology

## 2014-10-16 DIAGNOSIS — R0789 Other chest pain: Secondary | ICD-10-CM

## 2014-10-23 ENCOUNTER — Encounter (HOSPITAL_COMMUNITY)
Admission: RE | Admit: 2014-10-23 | Discharge: 2014-10-23 | Disposition: A | Discharge status: Home / Self Care | Payer: Medicare Other | Source: Ambulatory Visit | Attending: Cardiology | Admitting: Cardiology

## 2014-10-23 DIAGNOSIS — R0789 Other chest pain: Secondary | ICD-10-CM

## 2014-10-23 LAB — NM MYOCAR MULTI W/SPECT W/WALL MOTION / EF
LV dias vol: 109 mL
LVSYSVOL: 60 mL
NUC STRESS TID: 1.16
Nuc Stress EF: 45 %
RATE: 0.37
SDS: 1
SRS: 10
SSS: 11

## 2014-10-23 MED ORDER — REGADENOSON 0.4 MG/5ML IV SOLN
INTRAVENOUS | Status: AC
  Filled 2014-10-23: qty 5

## 2014-10-23 MED ORDER — REGADENOSON 0.4 MG/5ML IV SOLN
0.4000 mg | Freq: Once | INTRAVENOUS | Status: AC
  Administered 2014-10-23: 0.4 mg via INTRAVENOUS

## 2014-10-23 MED ORDER — TECHNETIUM TC 99M SESTAMIBI GENERIC - CARDIOLITE
30.0000 | Freq: Once | INTRAVENOUS | Status: AC | PRN
  Administered 2014-10-23: 30 via INTRAVENOUS

## 2014-10-23 MED ORDER — TECHNETIUM TC 99M SESTAMIBI GENERIC - CARDIOLITE
10.0000 | Freq: Once | INTRAVENOUS | Status: AC | PRN
  Administered 2014-10-23: 10 via INTRAVENOUS

## 2014-10-23 MED ORDER — SODIUM CHLORIDE 0.9 % IV BOLUS (SEPSIS)
250.0000 mL | Freq: Once | INTRAVENOUS | Status: AC
  Administered 2014-10-23: 250 mL via INTRAVENOUS

## 2015-06-07 ENCOUNTER — Emergency Department (HOSPITAL_BASED_OUTPATIENT_CLINIC_OR_DEPARTMENT_OTHER)
Admission: EM | Admit: 2015-06-07 | Discharge: 2015-06-07 | Disposition: A | Discharge status: Home / Self Care | Payer: Medicare Other | Attending: Emergency Medicine | Admitting: Emergency Medicine

## 2015-06-07 ENCOUNTER — Encounter (HOSPITAL_BASED_OUTPATIENT_CLINIC_OR_DEPARTMENT_OTHER): Payer: Self-pay | Admitting: Emergency Medicine

## 2015-06-07 DIAGNOSIS — E785 Hyperlipidemia, unspecified: Secondary | ICD-10-CM | POA: Diagnosis not present

## 2015-06-07 DIAGNOSIS — K219 Gastro-esophageal reflux disease without esophagitis: Secondary | ICD-10-CM | POA: Insufficient documentation

## 2015-06-07 DIAGNOSIS — F419 Anxiety disorder, unspecified: Secondary | ICD-10-CM | POA: Insufficient documentation

## 2015-06-07 DIAGNOSIS — Z9861 Coronary angioplasty status: Secondary | ICD-10-CM | POA: Diagnosis not present

## 2015-06-07 DIAGNOSIS — Z9889 Other specified postprocedural states: Secondary | ICD-10-CM | POA: Diagnosis not present

## 2015-06-07 DIAGNOSIS — J069 Acute upper respiratory infection, unspecified: Secondary | ICD-10-CM | POA: Insufficient documentation

## 2015-06-07 DIAGNOSIS — I2510 Atherosclerotic heart disease of native coronary artery without angina pectoris: Secondary | ICD-10-CM | POA: Insufficient documentation

## 2015-06-07 DIAGNOSIS — I10 Essential (primary) hypertension: Secondary | ICD-10-CM | POA: Diagnosis not present

## 2015-06-07 DIAGNOSIS — F909 Attention-deficit hyperactivity disorder, unspecified type: Secondary | ICD-10-CM | POA: Insufficient documentation

## 2015-06-07 DIAGNOSIS — M1990 Unspecified osteoarthritis, unspecified site: Secondary | ICD-10-CM | POA: Diagnosis not present

## 2015-06-07 DIAGNOSIS — Z87828 Personal history of other (healed) physical injury and trauma: Secondary | ICD-10-CM | POA: Diagnosis not present

## 2015-06-07 DIAGNOSIS — E119 Type 2 diabetes mellitus without complications: Secondary | ICD-10-CM | POA: Diagnosis not present

## 2015-06-07 DIAGNOSIS — Z8701 Personal history of pneumonia (recurrent): Secondary | ICD-10-CM | POA: Diagnosis not present

## 2015-06-07 DIAGNOSIS — G4730 Sleep apnea, unspecified: Secondary | ICD-10-CM | POA: Insufficient documentation

## 2015-06-07 DIAGNOSIS — I252 Old myocardial infarction: Secondary | ICD-10-CM | POA: Insufficient documentation

## 2015-06-07 DIAGNOSIS — F329 Major depressive disorder, single episode, unspecified: Secondary | ICD-10-CM | POA: Diagnosis not present

## 2015-06-07 DIAGNOSIS — R52 Pain, unspecified: Secondary | ICD-10-CM | POA: Diagnosis present

## 2015-06-07 LAB — CBG MONITORING, ED: Glucose-Capillary: 117 mg/dL — ABNORMAL HIGH (ref 65–99)

## 2015-06-07 MED ORDER — IBUPROFEN 800 MG PO TABS
800.0000 mg | ORAL_TABLET | Freq: Once | ORAL | Status: AC
  Administered 2015-06-07: 800 mg via ORAL
  Filled 2015-06-07: qty 1

## 2015-06-07 NOTE — ED Provider Notes (Signed)
CSN: [id number]     Arrival date & time 06/07/15  1635 History   First MD Initiated Contact with Patient 06/07/15 1941     Chief Complaint  Patient presents with  . Generalized Body Aches   HPI Cody [name] is a 49 y.o. F PMH significant for DM, HDL, MI, CAD, anxiety presenting with a 1 day history of body aches, ear fullness, subjective fever, and diarrhea. His wife is here with similar complaints.  No cough, sore throat, CP, abdominal pain, N/V, no alleviation attempts.   Past Medical History  Diagnosis Date  . Hyperlipidemia     takes Lipitor nightly  . 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 Stented coronary artery 2000, 2010  . Heart attack ([place]) 2000  . Shortness of breath     with exertion  . Peripheral edema     takes Furosemide daily  . Pneumonia     hx of >54yr ago  . Headache(784.0)   . Arthritis   . Joint pain   . Joint swelling   . Bruises easily     takes Effient daily  . GERD (gastroesophageal reflux disease)     takes Prilosec prn  . Urinary urgency   . Nocturia   . Diabetes mellitus     takes Januvia,Metformin,and Glipizide daily  . ADD (attention deficit disorder with hyperactivity)   . Anxiety     takes Xanax bid  . Depression     takes Celexa daily  . ADD (attention deficit disorder with hyperactivity)     takes Adderall daily  . Sleep apnea     uses CPAP nightly  . Hypertension   . Coronary artery disease     last cath 1/14   Past Surgical History  Procedure Laterality Date  . Knee surgery  24 yrs. ago    right  . Application of wound vac  11/20/2010    and suture of bleeding post-op wound right buttock  . Rectal surgery  11/20/2010    exc. of infection perianal area right buttock (I & D)  . Ankle arthroscopy  05/13/2011    Procedure: ANKLE ARTHROSCOPY;  Surgeon: DNinetta Lights MD;  [place] [facility]  Service: Orthopedics;  Laterality: Right;  right ankle arthroscopy with extensive debridement  synovectomy and chondroplasty  . Colonoscopy    . Total knee arthroplasty  10/13/2011    Procedure: TOTAL KNEE ARTHROPLASTY;  Surgeon: DNinetta Lights MD;  [facility] [facility]  Service: Orthopedics;  Laterality: Right;  DR MURPHY WANTS 90 MINUTES FOR THIS CASE  . Cardiac catheterization  12/05/2008    and in 2000  . Coronary angioplasty      2 stents  . Cardiac catheterization  1/14    no stent put in  . Hydradenitis excision Right 10/30/2012    Procedure: wide EXCISION HYDRADENITIS right buttock ;  Surgeon: DHarl Bowie MD;  [place] [facility]  Service: General;  Laterality: Right;  . Cholecystectomy N/A 10/11/2013    Procedure: LAPAROSCOPIC CHOLECYSTECTOMY ;  Surgeon: DHarl Bowie MD;  [place] [facility]  Service: General;  Laterality: N/A;  . Left heart catheterization with coronary angiogram N/A 06/15/2012    Procedure: LEFT HEART CATHETERIZATION WITH CORONARY ANGIOGRAM;  Surgeon: MClent Demark MD;  [facility];  Service: Cardiovascular;  Laterality: N/A;   Family History  Problem Relation Age of Onset  . Anesthesia problems Neg Hx   .  Hypotension Neg Hx   . Malignant hyperthermia Neg Hx   . Pseudochol deficiency Neg Hx   . Other Mother     car accident; brain injury   Social History  Substance Use Topics  . Smoking status: Current Every Day Smoker -- 1.00 packs/day for 25 years    Types: Cigarettes  . Smokeless tobacco: Never Used  . Alcohol Use: No    Review of Systems  Ten systems are reviewed and are negative for acute change except as noted in the HPI  Allergies  Review of patient's allergies indicates no known allergies.  Home Medications   Prior to Admission medications   Medication Sig Start Date End Date Taking? Authorizing Provider  albuterol (PROVENTIL HFA;VENTOLIN HFA) 108 (90 BASE) MCG/ACT inhaler Inhale 2 puffs into the lungs every 6 (six) hours as needed for wheezing.    Historical Provider, MD  ALPRAZolam Duanne Moron) 1 MG  tablet Take 0.5 mg by mouth 2 (two) times daily as needed for anxiety.     Historical Provider, MD  aspirin 81 MG chewable tablet Chew 81 mg by mouth 2 (two) times daily.     Historical Provider, MD  atorvastatin (LIPITOR) 40 MG tablet Take 40 mg by mouth every evening.     Historical Provider, MD  calcium carbonate (OS-CAL - DOSED IN MG OF ELEMENTAL CALCIUM) 1250 MG tablet Take 1 tablet by mouth at bedtime.     Historical Provider, MD  carvedilol (COREG) 3.125 MG tablet Take 1 tablet (3.125 mg total) by mouth 2 (two) times daily with a meal. 12/08/12   Charolette Forward, MD  citalopram (CELEXA) 40 MG tablet Take 40 mg by mouth every evening. PM    Historical Provider, MD  dicyclomine (BENTYL) 20 MG tablet Take 20 mg by mouth 2 (two) times daily as needed for spasms.     Historical Provider, MD  furosemide (LASIX) 40 MG tablet Take 40 mg by mouth daily.     Historical Provider, MD  glipiZIDE (GLUCOTROL) 10 MG tablet Take 10 mg by mouth 2 (two) times daily before a meal.     Historical Provider, MD  ibuprofen (ADVIL,MOTRIN) 200 MG tablet Take 200 mg by mouth every 6 (six) hours as needed for moderate pain.     Historical Provider, MD  metFORMIN (GLUCOPHAGE) 1000 MG tablet Take 1,000 mg by mouth 2 (two) times daily. Patient takes 1000 mg in morning and '1000mg'$  in evening, daily.    Historical Provider, MD  Multiple Vitamin (MULTIVITAMIN WITH MINERALS) TABS Take 1 tablet by mouth daily.    Historical Provider, MD  nitroGLYCERIN (NITROSTAT) 0.4 MG SL tablet Place 1 tablet (0.4 mg total) under the tongue every 5 (five) minutes x 3 doses as needed for chest pain. 12/08/12   Charolette Forward, MD  Omega-3 Fatty Acids (FISH OIL PO) Take 1 tablet by mouth daily.    Historical Provider, MD  omeprazole (PRILOSEC) 40 MG capsule Take 80 mg by mouth at bedtime.     Historical Provider, MD  oxyCODONE-acetaminophen (PERCOCET) 10-325 MG per tablet Take 1 tablet by mouth every 4 (four) hours as needed for pain. 10/11/13   Coralie Keens, MD  pantoprazole (PROTONIX) 40 MG tablet Take 40 mg by mouth daily.    Historical Provider, MD  potassium chloride SA (K-DUR,KLOR-CON) 20 MEQ tablet Take 20 mEq by mouth daily.    Historical Provider, MD  ramipril (ALTACE) 5 MG capsule Take 5 mg by mouth 2 (two) times daily.  Historical Provider, MD  sitaGLIPtin (JANUVIA) 100 MG tablet Take 100 mg by mouth every morning.     Historical Provider, MD  triamcinolone ointment (KENALOG) 0.5 % Apply 1 application topically 2 (two) times daily as needed (dry skin).    Historical Provider, MD   BP 105/76 mmHg  Pulse 82  Temp(Src) 98.5 F (36.9 C) (Oral)  Resp 18  Ht '5\' 7"'$  (1.702 m)  Wt 97.523 kg  BMI 33.67 kg/m2  SpO2 99% Physical Exam  Constitutional: He appears well-developed and well-nourished. No distress.  HENT:  Head: Normocephalic and atraumatic.  Right Ear: External ear normal.  Left Ear: External ear normal.  Nose: Nose normal.  Mouth/Throat: Oropharynx is clear and moist. No oropharyngeal exudate.  Clear effusion middle ear BL.   Eyes: Conjunctivae are normal. Pupils are equal, round, and reactive to light. Right eye exhibits no discharge. Left eye exhibits no discharge. No scleral icterus.  Neck: No tracheal deviation present.  Cardiovascular: Normal rate, regular rhythm, normal heart sounds and intact distal pulses.  Exam reveals no gallop and no friction rub.   No murmur heard. Pulmonary/Chest: Effort normal and breath sounds normal. No respiratory distress. He has no wheezes. He has no rales. He exhibits no tenderness.  Abdominal: Soft. Bowel sounds are normal. He exhibits no distension and no mass. There is no tenderness. There is no rebound and no guarding.  Musculoskeletal: He exhibits no edema.  Lymphadenopathy:    He has no cervical adenopathy.  Neurological: He is alert. Coordination normal.  Skin: Skin is warm and dry. No rash noted. He is not diaphoretic. No erythema.  Psychiatric: He has a normal mood  and affect. His behavior is normal.  Nursing note and vitals reviewed.   ED Course  Procedures  Labs Review Labs Reviewed  CBG MONITORING, ED - Abnormal; Notable for the following:    Glucose-Capillary 117 (*)    All other components within normal limits    MDM   Final diagnoses:  None   Patient non-toxic appearing and VSS. Most likely URI. Patient feeling better after ibuprofen. Patient may be safely discharged home. Discussed reasons for return. Patient to follow-up with primary care provider within one week. Patient in understanding and agreement with the plan.   Wetherington Lions, PA-C 06/19/15 2312  Quintella Reichert, MD 06/20/15 1420

## 2015-06-07 NOTE — Discharge Instructions (Signed)
[name],  Nice meeting you! Please follow-up with your primary care provider. Seek medical care if you develop fevers, chills, or your symptoms last longer than 10 days. Feel better soon!  S. Wendie Simmer, PA-C   Upper Respiratory Infection, Adult Most upper respiratory infections (URIs) are a viral infection of the air passages leading to the lungs. A URI affects the nose, throat, and upper air passages. The most common type of URI is nasopharyngitis and is typically referred to as "the common cold." URIs run their course and usually go away on their own. Most of the time, a URI does not require medical attention, but sometimes a bacterial infection in the upper airways can follow a viral infection. This is called a secondary infection. Sinus and middle ear infections are common types of secondary upper respiratory infections. Bacterial pneumonia can also complicate a URI. A URI can worsen asthma and chronic obstructive pulmonary disease (COPD). Sometimes, these complications can require emergency medical care and may be life threatening.  CAUSES Almost all URIs are caused by viruses. A virus is a type of germ and can spread from one person to another.  RISKS FACTORS You may be at risk for a URI if:   You smoke.   You have chronic heart or lung disease.  You have a weakened defense (immune) system.   You are very young or very old.   You have nasal allergies or asthma.  You work in crowded or poorly ventilated areas.  You work in health care facilities or schools. SIGNS AND SYMPTOMS  Symptoms typically develop 2-3 days after you come in contact with a cold virus. Most viral URIs last 7-10 days. However, viral URIs from the influenza virus (flu virus) can last 14-18 days and are typically more severe. Symptoms may include:   Runny or stuffy (congested) nose.   Sneezing.   Cough.   Sore throat.   Headache.   Fatigue.   Fever.   Loss of appetite.    Pain in your forehead, behind your eyes, and over your cheekbones (sinus pain).  Muscle aches.  DIAGNOSIS  Your health care provider may diagnose a URI by:  Physical exam.  Tests to check that your symptoms are not due to another condition such as:  Strep throat.  Sinusitis.  Pneumonia.  Asthma. TREATMENT  A URI goes away on its own with time. It cannot be cured with medicines, but medicines may be prescribed or recommended to relieve symptoms. Medicines may help:  Reduce your fever.  Reduce your cough.  Relieve nasal congestion. HOME CARE INSTRUCTIONS   Take medicines only as directed by your health care provider.   Gargle warm saltwater or take cough drops to comfort your throat as directed by your health care provider.  Use a warm mist humidifier or inhale steam from a shower to increase air moisture. This may make it easier to breathe.  Drink enough fluid to keep your urine clear or pale yellow.   Eat soups and other clear broths and maintain good nutrition.   Rest as needed.   Return to work when your temperature has returned to normal or as your health care provider advises. You may need to stay home longer to avoid infecting others. You can also use a face mask and careful hand washing to prevent spread of the virus.  Increase the usage of your inhaler if you have asthma.   Do not use any tobacco products, including cigarettes, chewing tobacco,  or electronic cigarettes. If you need help quitting, ask your health care provider. PREVENTION  The best way to protect yourself from getting a cold is to practice good hygiene.   Avoid oral or hand contact with people with cold symptoms.   Wash your hands often if contact occurs.  There is no clear evidence that vitamin C, vitamin E, echinacea, or exercise reduces the chance of developing a cold. However, it is always recommended to get plenty of rest, exercise, and practice good nutrition.  SEEK MEDICAL  CARE IF:   You are getting worse rather than better.   Your symptoms are not controlled by medicine.   You have chills.  You have worsening shortness of breath.  You have brown or red mucus.  You have yellow or brown nasal discharge.  You have pain in your face, especially when you bend forward.  You have a fever.  You have swollen neck glands.  You have pain while swallowing.  You have white areas in the back of your throat. SEEK IMMEDIATE MEDICAL CARE IF:   You have severe or persistent:  Headache.  Ear pain.  Sinus pain.  Chest pain.  You have chronic lung disease and any of the following:  Wheezing.  Prolonged cough.  Coughing up blood.  A change in your usual mucus.  You have a stiff neck.  You have changes in your:  Vision.  Hearing.  Thinking.  Mood. MAKE SURE YOU:   Understand these instructions.  Will watch your condition.  Will get help right away if you are not doing well or get worse.   This information is not intended to replace advice given to you by your health care provider. Make sure you discuss any questions you have with your health care provider.   Document Released: 11/24/2000 Document Revised: 10/15/2014 Document Reviewed: 09/05/2013 Elsevier Interactive Patient Education [employer].

## 2015-06-07 NOTE — ED Notes (Signed)
Pt in c/o generalized sx including, ear ache, generalized body aches, diarrhea, and fever last night. Pt in with wife c/o similar sx.

## 2015-07-29 ENCOUNTER — Inpatient Hospital Stay
Admission: EM | Admit: 2015-07-29 | Payer: Self-pay | Source: Other Acute Inpatient Hospital | Admitting: Pulmonary Disease

## 2015-07-30 ENCOUNTER — Emergency Department (HOSPITAL_COMMUNITY): Payer: Medicare HMO

## 2015-07-30 ENCOUNTER — Inpatient Hospital Stay (HOSPITAL_COMMUNITY)
Admission: EM | Admit: 2015-07-30 | Discharge: 2015-08-08 | DRG: 166 | Disposition: A | Discharge status: Home / Self Care | Payer: Medicare HMO | Attending: Internal Medicine | Admitting: Internal Medicine

## 2015-07-30 ENCOUNTER — Inpatient Hospital Stay (HOSPITAL_COMMUNITY): Payer: Medicare HMO

## 2015-07-30 ENCOUNTER — Encounter (HOSPITAL_COMMUNITY): Payer: Self-pay

## 2015-07-30 DIAGNOSIS — K219 Gastro-esophageal reflux disease without esophagitis: Secondary | ICD-10-CM | POA: Diagnosis present

## 2015-07-30 DIAGNOSIS — R918 Other nonspecific abnormal finding of lung field: Secondary | ICD-10-CM | POA: Diagnosis not present

## 2015-07-30 DIAGNOSIS — F909 Attention-deficit hyperactivity disorder, unspecified type: Secondary | ICD-10-CM | POA: Diagnosis present

## 2015-07-30 DIAGNOSIS — F329 Major depressive disorder, single episode, unspecified: Secondary | ICD-10-CM | POA: Diagnosis present

## 2015-07-30 DIAGNOSIS — Z7982 Long term (current) use of aspirin: Secondary | ICD-10-CM | POA: Diagnosis not present

## 2015-07-30 DIAGNOSIS — I110 Hypertensive heart disease with heart failure: Secondary | ICD-10-CM | POA: Diagnosis present

## 2015-07-30 DIAGNOSIS — E1100 Type 2 diabetes mellitus with hyperosmolarity without nonketotic hyperglycemic-hyperosmolar coma (NKHHC): Secondary | ICD-10-CM | POA: Diagnosis present

## 2015-07-30 DIAGNOSIS — E785 Hyperlipidemia, unspecified: Secondary | ICD-10-CM | POA: Diagnosis present

## 2015-07-30 DIAGNOSIS — G4733 Obstructive sleep apnea (adult) (pediatric): Secondary | ICD-10-CM | POA: Diagnosis present

## 2015-07-30 DIAGNOSIS — F419 Anxiety disorder, unspecified: Secondary | ICD-10-CM | POA: Diagnosis present

## 2015-07-30 DIAGNOSIS — E0800 Diabetes mellitus due to underlying condition with hyperosmolarity without nonketotic hyperglycemic-hyperosmolar coma (NKHHC): Secondary | ICD-10-CM | POA: Diagnosis not present

## 2015-07-30 DIAGNOSIS — J9811 Atelectasis: Secondary | ICD-10-CM | POA: Diagnosis present

## 2015-07-30 DIAGNOSIS — C3432 Malignant neoplasm of lower lobe, left bronchus or lung: Principal | ICD-10-CM | POA: Diagnosis present

## 2015-07-30 DIAGNOSIS — E669 Obesity, unspecified: Secondary | ICD-10-CM | POA: Diagnosis present

## 2015-07-30 DIAGNOSIS — C3492 Malignant neoplasm of unspecified part of left bronchus or lung: Secondary | ICD-10-CM | POA: Diagnosis not present

## 2015-07-30 DIAGNOSIS — J984 Other disorders of lung: Secondary | ICD-10-CM | POA: Diagnosis not present

## 2015-07-30 DIAGNOSIS — E118 Type 2 diabetes mellitus with unspecified complications: Secondary | ICD-10-CM | POA: Diagnosis present

## 2015-07-30 DIAGNOSIS — J948 Other specified pleural conditions: Secondary | ICD-10-CM | POA: Diagnosis not present

## 2015-07-30 DIAGNOSIS — R0602 Shortness of breath: Secondary | ICD-10-CM | POA: Diagnosis present

## 2015-07-30 DIAGNOSIS — I10 Essential (primary) hypertension: Secondary | ICD-10-CM | POA: Diagnosis present

## 2015-07-30 DIAGNOSIS — J9601 Acute respiratory failure with hypoxia: Secondary | ICD-10-CM | POA: Diagnosis present

## 2015-07-30 DIAGNOSIS — D689 Coagulation defect, unspecified: Secondary | ICD-10-CM | POA: Diagnosis present

## 2015-07-30 DIAGNOSIS — I319 Disease of pericardium, unspecified: Secondary | ICD-10-CM | POA: Diagnosis not present

## 2015-07-30 DIAGNOSIS — Z7984 Long term (current) use of oral hypoglycemic drugs: Secondary | ICD-10-CM | POA: Diagnosis not present

## 2015-07-30 DIAGNOSIS — K0889 Other specified disorders of teeth and supporting structures: Secondary | ICD-10-CM | POA: Diagnosis present

## 2015-07-30 DIAGNOSIS — I5032 Chronic diastolic (congestive) heart failure: Secondary | ICD-10-CM | POA: Diagnosis present

## 2015-07-30 DIAGNOSIS — C787 Secondary malignant neoplasm of liver and intrahepatic bile duct: Secondary | ICD-10-CM | POA: Diagnosis present

## 2015-07-30 DIAGNOSIS — Z6834 Body mass index (BMI) 34.0-34.9, adult: Secondary | ICD-10-CM

## 2015-07-30 DIAGNOSIS — R627 Adult failure to thrive: Secondary | ICD-10-CM | POA: Diagnosis present

## 2015-07-30 DIAGNOSIS — Z955 Presence of coronary angioplasty implant and graft: Secondary | ICD-10-CM

## 2015-07-30 DIAGNOSIS — J910 Malignant pleural effusion: Secondary | ICD-10-CM | POA: Diagnosis present

## 2015-07-30 DIAGNOSIS — Z79899 Other long term (current) drug therapy: Secondary | ICD-10-CM | POA: Diagnosis not present

## 2015-07-30 DIAGNOSIS — J45909 Unspecified asthma, uncomplicated: Secondary | ICD-10-CM | POA: Diagnosis present

## 2015-07-30 DIAGNOSIS — E875 Hyperkalemia: Secondary | ICD-10-CM | POA: Diagnosis present

## 2015-07-30 DIAGNOSIS — E43 Unspecified severe protein-calorie malnutrition: Secondary | ICD-10-CM | POA: Diagnosis present

## 2015-07-30 DIAGNOSIS — R0902 Hypoxemia: Secondary | ICD-10-CM | POA: Diagnosis not present

## 2015-07-30 DIAGNOSIS — C786 Secondary malignant neoplasm of retroperitoneum and peritoneum: Secondary | ICD-10-CM | POA: Diagnosis present

## 2015-07-30 DIAGNOSIS — R0609 Other forms of dyspnea: Secondary | ICD-10-CM | POA: Diagnosis not present

## 2015-07-30 DIAGNOSIS — F17210 Nicotine dependence, cigarettes, uncomplicated: Secondary | ICD-10-CM | POA: Diagnosis present

## 2015-07-30 DIAGNOSIS — R634 Abnormal weight loss: Secondary | ICD-10-CM | POA: Diagnosis present

## 2015-07-30 DIAGNOSIS — M1990 Unspecified osteoarthritis, unspecified site: Secondary | ICD-10-CM | POA: Diagnosis present

## 2015-07-30 DIAGNOSIS — I252 Old myocardial infarction: Secondary | ICD-10-CM

## 2015-07-30 DIAGNOSIS — C799 Secondary malignant neoplasm of unspecified site: Secondary | ICD-10-CM

## 2015-07-30 DIAGNOSIS — I959 Hypotension, unspecified: Secondary | ICD-10-CM | POA: Diagnosis not present

## 2015-07-30 DIAGNOSIS — E119 Type 2 diabetes mellitus without complications: Secondary | ICD-10-CM

## 2015-07-30 DIAGNOSIS — J440 Chronic obstructive pulmonary disease with acute lower respiratory infection: Secondary | ICD-10-CM | POA: Diagnosis present

## 2015-07-30 DIAGNOSIS — R599 Enlarged lymph nodes, unspecified: Secondary | ICD-10-CM | POA: Diagnosis not present

## 2015-07-30 DIAGNOSIS — Z96651 Presence of right artificial knee joint: Secondary | ICD-10-CM | POA: Diagnosis present

## 2015-07-30 DIAGNOSIS — C7970 Secondary malignant neoplasm of unspecified adrenal gland: Secondary | ICD-10-CM | POA: Diagnosis present

## 2015-07-30 DIAGNOSIS — J189 Pneumonia, unspecified organism: Secondary | ICD-10-CM | POA: Diagnosis present

## 2015-07-30 DIAGNOSIS — C7951 Secondary malignant neoplasm of bone: Secondary | ICD-10-CM | POA: Diagnosis not present

## 2015-07-30 DIAGNOSIS — J90 Pleural effusion, not elsewhere classified: Secondary | ICD-10-CM | POA: Diagnosis not present

## 2015-07-30 DIAGNOSIS — I2510 Atherosclerotic heart disease of native coronary artery without angina pectoris: Secondary | ICD-10-CM | POA: Diagnosis present

## 2015-07-30 DIAGNOSIS — C771 Secondary and unspecified malignant neoplasm of intrathoracic lymph nodes: Secondary | ICD-10-CM | POA: Diagnosis present

## 2015-07-30 DIAGNOSIS — Z9989 Dependence on other enabling machines and devices: Secondary | ICD-10-CM

## 2015-07-30 HISTORY — DX: Reserved for concepts with insufficient information to code with codable children: IMO0002

## 2015-07-30 HISTORY — DX: Heart failure, unspecified: I50.9

## 2015-07-30 HISTORY — DX: Unspecified asthma, uncomplicated: J45.909

## 2015-07-30 LAB — BASIC METABOLIC PANEL
Anion gap: 12 (ref 5–15)
BUN: 5 mg/dL — AB (ref 6–20)
CHLORIDE: 100 mmol/L — AB (ref 101–111)
CO2: 25 mmol/L (ref 22–32)
Calcium: 9.3 mg/dL (ref 8.9–10.3)
Creatinine, Ser: 0.73 mg/dL (ref 0.61–1.24)
GFR calc non Af Amer: >60 mL/min (ref >60)
Glucose, Bld: 65 mg/dL (ref 65–99)
POTASSIUM: 5.4 mmol/L — AB (ref 3.5–5.1)
Sodium: 137 mmol/L (ref 135–145)

## 2015-07-30 LAB — CBC WITH DIFFERENTIAL/PLATELET
Basophils Absolute: 0.1 10*3/uL (ref 0.0–0.1)
Basophils Relative: 1 %
Eosinophils Absolute: 0.2 10*3/uL (ref 0.0–0.7)
Eosinophils Relative: 2 %
HEMATOCRIT: 39.2 % (ref 39.0–52.0)
Hemoglobin: 12.7 g/dL — ABNORMAL LOW (ref 13.0–17.0)
Lymphocytes Relative: 16 %
Lymphs Abs: 1.5 10*3/uL (ref 0.7–4.0)
MCH: 29.9 pg (ref 26.0–34.0)
MCHC: 32.4 g/dL (ref 30.0–36.0)
MCV: 92.2 fL (ref 78.0–100.0)
Monocytes Absolute: 1 10*3/uL (ref 0.1–1.0)
Monocytes Relative: 10 %
NEUTROS ABS: 6.8 10*3/uL (ref 1.7–7.7)
NEUTROS PCT: 71 %
Platelets: 246 10*3/uL (ref 150–400)
RBC: 4.25 MIL/uL (ref 4.22–5.81)
RDW: 14.7 % (ref 11.5–15.5)
WBC: 9.6 10*3/uL (ref 4.0–10.5)

## 2015-07-30 LAB — GRAM STAIN

## 2015-07-30 LAB — BODY FLUID CELL COUNT WITH DIFFERENTIAL
Eos, Fluid: 2 %
Lymphs, Fluid: 71 %
Monocyte-Macrophage-Serous Fluid: 15 % — ABNORMAL LOW (ref 50–90)
NEUTROPHIL FLUID: 12 % (ref 0–25)
WBC FLUID: 1186 uL — AB (ref 0–1000)

## 2015-07-30 LAB — GLUCOSE, CAPILLARY
GLUCOSE-CAPILLARY: 132 mg/dL — AB (ref 65–99)
GLUCOSE-CAPILLARY: 53 mg/dL — AB (ref 65–99)
Glucose-Capillary: 80 mg/dL (ref 65–99)

## 2015-07-30 LAB — PROTEIN, BODY FLUID: Total protein, fluid: 4 g/dL

## 2015-07-30 LAB — I-STAT TROPONIN, ED: Troponin i, poc: 0 ng/mL (ref 0.00–0.08)

## 2015-07-30 LAB — LACTATE DEHYDROGENASE, PLEURAL OR PERITONEAL FLUID: LD, Fluid: 683 U/L — ABNORMAL HIGH (ref 3–23)

## 2015-07-30 LAB — PROCALCITONIN

## 2015-07-30 LAB — MRSA PCR SCREENING: MRSA by PCR: NEGATIVE

## 2015-07-30 LAB — GLUCOSE, SEROUS FLUID: Glucose, Fluid: 72 mg/dL

## 2015-07-30 LAB — LACTIC ACID, PLASMA: LACTIC ACID, VENOUS: 1.4 mmol/L (ref 0.5–2.0)

## 2015-07-30 MED ORDER — SODIUM CHLORIDE 0.9% FLUSH: 3.0000 mL | INTRAVENOUS | Status: DC | PRN

## 2015-07-30 MED ORDER — ONDANSETRON HCL 4 MG/2ML IJ SOLN: 4.0000 mg | Freq: Four times a day (QID) | INTRAMUSCULAR | Status: DC | PRN

## 2015-07-30 MED ORDER — FUROSEMIDE 40 MG PO TABS
40.0000 mg | ORAL_TABLET | Freq: Every day | ORAL | Status: DC
  Administered 2015-07-30 – 2015-08-01 (×3): 40 mg via ORAL
  Filled 2015-07-30: qty 1
  Filled 2015-07-30: qty 2
  Filled 2015-07-30: qty 1

## 2015-07-30 MED ORDER — CITALOPRAM HYDROBROMIDE 20 MG PO TABS
40.0000 mg | ORAL_TABLET | Freq: Every evening | ORAL | Status: DC
  Administered 2015-07-31 – 2015-08-07 (×8): 40 mg via ORAL
  Filled 2015-07-30 (×8): qty 2

## 2015-07-30 MED ORDER — ALPRAZOLAM 0.5 MG PO TABS
0.5000 mg | ORAL_TABLET | Freq: Two times a day (BID) | ORAL | Status: DC | PRN
  Administered 2015-07-31 – 2015-08-07 (×8): 0.5 mg via ORAL
  Filled 2015-07-30 (×9): qty 1

## 2015-07-30 MED ORDER — RAMIPRIL 5 MG PO CAPS
5.0000 mg | ORAL_CAPSULE | Freq: Every day | ORAL | Status: DC
  Administered 2015-07-31 – 2015-08-01 (×2): 5 mg via ORAL
  Filled 2015-07-30 (×2): qty 1

## 2015-07-30 MED ORDER — OXYCODONE HCL 5 MG PO TABS
5.0000 mg | ORAL_TABLET | Freq: Four times a day (QID) | ORAL | Status: DC | PRN
  Administered 2015-07-30 – 2015-08-08 (×13): 5 mg via ORAL
  Filled 2015-07-30 (×15): qty 1

## 2015-07-30 MED ORDER — CARVEDILOL 3.125 MG PO TABS
3.1250 mg | ORAL_TABLET | Freq: Two times a day (BID) | ORAL | Status: DC
  Administered 2015-07-31 – 2015-08-08 (×14): 3.125 mg via ORAL
  Filled 2015-07-30 (×16): qty 1

## 2015-07-30 MED ORDER — HEPARIN SODIUM (PORCINE) 5000 UNIT/ML IJ SOLN
5000.0000 [IU] | Freq: Three times a day (TID) | INTRAMUSCULAR | Status: DC
  Administered 2015-07-31 (×3): 5000 [IU] via SUBCUTANEOUS
  Filled 2015-07-30 (×4): qty 1

## 2015-07-30 MED ORDER — INSULIN ASPART 100 UNIT/ML ~~LOC~~ SOLN
0.0000 [IU] | Freq: Three times a day (TID) | SUBCUTANEOUS | Status: DC
  Administered 2015-07-31: 3 [IU] via SUBCUTANEOUS
  Administered 2015-07-31: 5 [IU] via SUBCUTANEOUS

## 2015-07-30 MED ORDER — ACETAMINOPHEN 325 MG PO TABS
650.0000 mg | ORAL_TABLET | Freq: Four times a day (QID) | ORAL | Status: DC | PRN
  Administered 2015-08-02 – 2015-08-06 (×3): 650 mg via ORAL
  Filled 2015-07-30 (×4): qty 2

## 2015-07-30 MED ORDER — ONDANSETRON HCL 4 MG PO TABS: 4.0000 mg | ORAL_TABLET | Freq: Four times a day (QID) | ORAL | Status: DC | PRN

## 2015-07-30 MED ORDER — PANTOPRAZOLE SODIUM 40 MG PO TBEC
40.0000 mg | DELAYED_RELEASE_TABLET | Freq: Every day | ORAL | Status: DC
  Administered 2015-07-31 – 2015-08-08 (×8): 40 mg via ORAL
  Filled 2015-07-30 (×8): qty 1

## 2015-07-30 MED ORDER — SODIUM CHLORIDE 0.9 % IV SOLN: 250.0000 mL | INTRAVENOUS | Status: DC | PRN

## 2015-07-30 MED ORDER — ACETAMINOPHEN 650 MG RE SUPP: 650.0000 mg | Freq: Four times a day (QID) | RECTAL | Status: DC | PRN

## 2015-07-30 MED ORDER — ASPIRIN 81 MG PO CHEW
81.0000 mg | CHEWABLE_TABLET | Freq: Two times a day (BID) | ORAL | Status: DC
  Administered 2015-07-30 – 2015-08-01 (×5): 81 mg via ORAL
  Filled 2015-07-30 (×5): qty 1

## 2015-07-30 MED ORDER — ATORVASTATIN CALCIUM 40 MG PO TABS
40.0000 mg | ORAL_TABLET | Freq: Every evening | ORAL | Status: DC
  Administered 2015-07-31 – 2015-08-07 (×8): 40 mg via ORAL
  Filled 2015-07-30 (×8): qty 1

## 2015-07-30 MED ORDER — OXYCODONE-ACETAMINOPHEN 10-325 MG PO TABS: 1.0000 | ORAL_TABLET | Freq: Four times a day (QID) | ORAL | Status: DC | PRN

## 2015-07-30 MED ORDER — IPRATROPIUM-ALBUTEROL 0.5-2.5 (3) MG/3ML IN SOLN
3.0000 mL | RESPIRATORY_TRACT | Status: DC | PRN
  Administered 2015-08-01: 3 mL via RESPIRATORY_TRACT
  Filled 2015-07-30: qty 3

## 2015-07-30 MED ORDER — OXYCODONE-ACETAMINOPHEN 5-325 MG PO TABS
1.0000 | ORAL_TABLET | Freq: Four times a day (QID) | ORAL | Status: DC | PRN
  Administered 2015-07-30 – 2015-08-08 (×25): 1 via ORAL
  Filled 2015-07-30 (×26): qty 1

## 2015-07-30 MED ORDER — NITROGLYCERIN 0.4 MG SL SUBL: 0.4000 mg | SUBLINGUAL_TABLET | SUBLINGUAL | Status: DC | PRN

## 2015-07-30 MED ORDER — SODIUM CHLORIDE 0.9% FLUSH
3.0000 mL | Freq: Two times a day (BID) | INTRAVENOUS | Status: DC
  Administered 2015-07-30 – 2015-08-01 (×4): 3 mL via INTRAVENOUS

## 2015-07-30 NOTE — Procedures (Signed)
Successful US guided left thoracentesis. Yielded 2 liters of amber fluid. Pt tolerated procedure well. No immediate complications.  Specimen was sent for labs. CXR ordered.  Liora Myles S Thurma Priego PA-C 07/30/2015 2:56 PM

## 2015-07-30 NOTE — ED Provider Notes (Signed)
CSN: [id number]     Arrival date & time 07/30/15  6160 History   First MD Initiated Contact with Patient 07/30/15 [phone]     Chief Complaint  Patient presents with  . Shortness of Breath     (Consider location/radiation/quality/duration/timing/severity/associated sxs/prior Treatment) HPI Comments: Patient is a 50 year old male with history of coronary artery disease, diabetes, hypertension, and cigarette use. He presents today with complaints of cough and difficulty breathing for the past 10 days. He was seen at [facility] emergency department last night and was told he had a "whited out left lung". He had a chest x-ray and CT scan performed and arrangements were made for him to be admitted. There is apparently a lengthy wait for a bed and the patient decided he could not wait this long. He went home and went to sleep and now presents here for evaluation of this. He has a disc with his reports.  Patient is a 50 y.o. male presenting with shortness of breath. The history is provided by the patient.  Shortness of Breath Severity:  Moderate Onset quality:  Gradual Duration:  10 days Timing:  Constant Progression:  Worsening Chronicity:  New Context: activity   Relieved by:  Nothing Worsened by:  Nothing tried Ineffective treatments:  None tried Associated symptoms: cough     Past Medical History  Diagnosis Date  . Hyperlipidemia     takes Lipitor nightly  . 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 Stented coronary artery 2000, 2010  . Heart attack ([place]) 2000  . Shortness of breath     with exertion  . Peripheral edema     takes Furosemide daily  . Pneumonia     hx of >59yr ago  . Headache(784.0)   . Arthritis   . Joint pain   . Joint swelling   . Bruises easily     takes Effient daily  . GERD (gastroesophageal reflux disease)     takes Prilosec prn  . Urinary urgency   . Nocturia   . Diabetes mellitus     takes Januvia,Metformin,and Glipizide daily   . ADD (attention deficit disorder with hyperactivity)   . Anxiety     takes Xanax bid  . Depression     takes Celexa daily  . ADD (attention deficit disorder with hyperactivity)     takes Adderall daily  . Sleep apnea     uses CPAP nightly  . Hypertension   . Coronary artery disease     last cath 1/14   Past Surgical History  Procedure Laterality Date  . Knee surgery  24 yrs. ago    right  . Application of wound vac  11/20/2010    and suture of bleeding post-op wound right buttock  . Rectal surgery  11/20/2010    exc. of infection perianal area right buttock (I & D)  . Ankle arthroscopy  05/13/2011    Procedure: ANKLE ARTHROSCOPY;  Surgeon: DNinetta Lights MD;  [facility] [facility]  Service: Orthopedics;  Laterality: Right;  right ankle arthroscopy with extensive debridement synovectomy and chondroplasty  . Colonoscopy    . Total knee arthroplasty  10/13/2011    Procedure: TOTAL KNEE ARTHROPLASTY;  Surgeon: DNinetta Lights MD;  [place] [facility]  Service: Orthopedics;  Laterality: Right;  DR MURPHY WANTS 90 MINUTES FOR THIS CASE  . Cardiac catheterization  12/05/2008    and in 2000  . Coronary angioplasty  2 stents  . Cardiac catheterization  1/14    no stent put in  . Hydradenitis excision Right 10/30/2012    Procedure: wide EXCISION HYDRADENITIS right buttock ;  Surgeon: Harl Bowie, MD;  Location: [place];  Service: General;  Laterality: Right;  . Cholecystectomy N/A 10/11/2013    Procedure: LAPAROSCOPIC CHOLECYSTECTOMY ;  Surgeon: Harl Bowie, MD;  Location: [place];  Service: General;  Laterality: N/A;  . Left heart catheterization with coronary angiogram N/A 06/15/2012    Procedure: LEFT HEART CATHETERIZATION WITH CORONARY ANGIOGRAM;  Surgeon: Clent Demark, MD;  Location: [place] CATH LAB;  Service: Cardiovascular;  Laterality: N/A;   Family History  Problem Relation Age of Onset  . Anesthesia problems Neg Hx   . Hypotension Neg  Hx   . Malignant hyperthermia Neg Hx   . Pseudochol deficiency Neg Hx   . Other Mother     car accident; brain injury   Social History  Substance Use Topics  . Smoking status: Current Every Day Smoker -- 1.00 packs/day for 25 years    Types: Cigarettes  . Smokeless tobacco: Never Used  . Alcohol Use: No    Review of Systems  Respiratory: Positive for cough and shortness of breath.   All other systems reviewed and are negative.     Allergies  Review of patient's allergies indicates no known allergies.  Home Medications   Prior to Admission medications   Medication Sig Start Date End Date Taking? Authorizing Provider  albuterol (PROVENTIL HFA;VENTOLIN HFA) 108 (90 BASE) MCG/ACT inhaler Inhale 2 puffs into the lungs every 6 (six) hours as needed for wheezing.    Historical Provider, MD  ALPRAZolam Duanne Moron) 1 MG tablet Take 0.5 mg by mouth 2 (two) times daily as needed for anxiety.     Historical Provider, MD  aspirin 81 MG chewable tablet Chew 81 mg by mouth 2 (two) times daily.     Historical Provider, MD  atorvastatin (LIPITOR) 40 MG tablet Take 40 mg by mouth every evening.     Historical Provider, MD  calcium carbonate (OS-CAL - DOSED IN MG OF ELEMENTAL CALCIUM) 1250 MG tablet Take 1 tablet by mouth at bedtime.     Historical Provider, MD  carvedilol (COREG) 3.125 MG tablet Take 1 tablet (3.125 mg total) by mouth 2 (two) times daily with a meal. 12/08/12   Charolette Forward, MD  citalopram (CELEXA) 40 MG tablet Take 40 mg by mouth every evening. PM    Historical Provider, MD  dicyclomine (BENTYL) 20 MG tablet Take 20 mg by mouth 2 (two) times daily as needed for spasms.     Historical Provider, MD  furosemide (LASIX) 40 MG tablet Take 40 mg by mouth daily.     Historical Provider, MD  glipiZIDE (GLUCOTROL) 10 MG tablet Take 10 mg by mouth 2 (two) times daily before a meal.     Historical Provider, MD  ibuprofen (ADVIL,MOTRIN) 200 MG tablet Take 200 mg by mouth every 6 (six) hours as  needed for moderate pain.     Historical Provider, MD  metFORMIN (GLUCOPHAGE) 1000 MG tablet Take 1,000 mg by mouth 2 (two) times daily. Patient takes 1000 mg in morning and '1000mg'$  in evening, daily.    Historical Provider, MD  Multiple Vitamin (MULTIVITAMIN WITH MINERALS) TABS Take 1 tablet by mouth daily.    Historical Provider, MD  nitroGLYCERIN (NITROSTAT) 0.4 MG SL tablet Place 1 tablet (0.4 mg total) under the tongue every 5 (  five) minutes x 3 doses as needed for chest pain. 12/08/12   Charolette Forward, MD  Omega-3 Fatty Acids (FISH OIL PO) Take 1 tablet by mouth daily.    Historical Provider, MD  omeprazole (PRILOSEC) 40 MG capsule Take 80 mg by mouth at bedtime.     Historical Provider, MD  oxyCODONE-acetaminophen (PERCOCET) 10-325 MG per tablet Take 1 tablet by mouth every 4 (four) hours as needed for pain. 10/11/13   Coralie Keens, MD  pantoprazole (PROTONIX) 40 MG tablet Take 40 mg by mouth daily.    Historical Provider, MD  potassium chloride SA (K-DUR,KLOR-CON) 20 MEQ tablet Take 20 mEq by mouth daily.    Historical Provider, MD  ramipril (ALTACE) 5 MG capsule Take 5 mg by mouth 2 (two) times daily.    Historical Provider, MD  sitaGLIPtin (JANUVIA) 100 MG tablet Take 100 mg by mouth every morning.     Historical Provider, MD  triamcinolone ointment (KENALOG) 0.5 % Apply 1 application topically 2 (two) times daily as needed (dry skin).    Historical Provider, MD   BP 102/76 mmHg  Pulse 80  Temp(Src) 97.9 F (36.6 C)  Resp 19  SpO2 97% Physical Exam  Constitutional: He is oriented to person, place, and time. He appears well-developed and well-nourished. No distress.  HENT:  Head: Normocephalic and atraumatic.  Neck: Normal range of motion. Neck supple.  Cardiovascular: Normal rate, regular rhythm and normal heart sounds.   No murmur heard. Pulmonary/Chest: Effort normal. No respiratory distress. He has no wheezes. He has no rales.  Breath sounds are absent on the left.   Abdominal: Soft. Bowel sounds are normal. He exhibits no distension. There is no tenderness.  Musculoskeletal: Normal range of motion. He exhibits no edema.  Neurological: He is alert and oriented to person, place, and time.  Skin: Skin is warm and dry. He is not diaphoretic.  Nursing note and vitals reviewed.   ED Course  Procedures (including critical care time) Labs Review Labs Reviewed - No data to display  Imaging Review No results found. I have personally reviewed and evaluated these images and lab results as part of my medical decision-making.    MDM   Final diagnoses:  None    [name] CT scan and report from yesterday at [facility] were reviewed. This reveals a completely collapsed lung with a large pleural effusion. There is also what appears to be a mass partially compressing the left mainstem bronchus. I've discussed this finding with Dr. Prescott Gum from cardiothoracic surgery. He feels as though the patient should be admitted to medicine with pulmonology and CT consult. Patient will likely require a bronchoscopy and possibly a chest tube. He will be admitted to the hospitalist service under the care of Dr. Marily Memos.    Veryl Speak, MD 07/30/15 [phone]

## 2015-07-30 NOTE — ED Notes (Signed)
Pt seen last night at [facility] ED. Pt states "left lung was all white and has a mass." Pt brought disc w/ imaging on it, given to Dr. Stark Jock. Pt lung sounds diminished left upper and lower lobes. Shortness of breath and pneumonia x 10-12 days.

## 2015-07-30 NOTE — ED Notes (Signed)
Phleb at bedside to draw labs.

## 2015-07-30 NOTE — ED Notes (Signed)
Pt remains monitored by blood pressure, pulse ox, and 5 lead. Pt is noted to have visitors at bedside.

## 2015-07-30 NOTE — ED Notes (Signed)
Pt transported to US

## 2015-07-30 NOTE — H&P (Signed)
[facility] History and Physical  Cody [name] ZOX:[id number] DOB: Nov 04, 1965 DOA: 07/30/2015  Referring physician:  PCP: Chesley Noon, MD   Chief Complaint:  Shortness of breath, Left Large Pleural Effusion   HPI: Cody [name] is a 50 y.o. male with a history of CAD, MI, DM2, HDL, Presenting today ED After presenting to [facility] [place] ED with increasing shortness of breath, cough, congestion.  He had been diagnosed with pneumonia 3 weeks prior, but the symptoms were not relieved with antibiotics as an outpatient. CT performed at [facility], [place] shown large left-sided pleural effusion, mediastinal adenopathy suspicious for endobronchial/hilar mass with lymph node metastases. Also seen, centered right-sided pulmonary nodules up to 3 mm. No PE was noted. Because of the lengthy wait at [facility] the patient decided to present here for further evaluation Denies fevers, chills, night sweats or mucositis. Denies hemoptysis. Denies any chest pain or palpitations. Denies lower extremity swelling. No confusion was reported. He denies any vision changes or double vision. He denies any headaches. He denies any abdominal pain,. He has been more nauseous without vomiting over the last week, without dizziness or vertigo. He has decreased appetite due to failure to thrive with unintentional 30 lbs weight loss.   At the ED, his Vitals are stable, OSATs normal. He was afebrile. CBC and CMET remarkable for K 5.4 with neg Tn at 0. He will be admitted for left throracentesis and further workup.    Review of Systems  Constitutional: Positive for chills, weight loss, malaise/fatigue and diaphoresis. Negative for fever.       30 lbs in the last 4 month unintentional but with food aversion over the last 2 weeks  HENT: Positive for congestion.   Eyes: Negative.   Respiratory: Positive for cough, sputum production, shortness of breath and wheezing. Negative for hemoptysis.        Left chest wall  pain due to cough  Cardiovascular: Negative for chest pain.  Gastrointestinal: Positive for nausea and vomiting. Negative for diarrhea, blood in stool and melena.       Diarrhea after antibiotics  Genitourinary: Negative.   Musculoskeletal: Positive for back pain and joint pain. Negative for myalgias.       Chronic back pain  Skin: Negative.   Neurological: Positive for weakness.  Endo/Heme/Allergies: Negative.   Psychiatric/Behavioral: Negative.     Past Medical History  Diagnosis Date  . Hyperlipidemia     takes Lipitor nightly  . 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 Stented coronary artery 2000, 2010  . Heart attack ([place]) 2000  . Shortness of breath     with exertion  . Peripheral edema     takes Furosemide daily  . Pneumonia     hx of >37yr ago  . Headache(784.0)   . Arthritis   . Joint pain   . Joint swelling   . Bruises easily     takes Effient daily  . GERD (gastroesophageal reflux disease)     takes Prilosec prn  . Urinary urgency   . Nocturia   . Diabetes mellitus     takes Januvia,Metformin,and Glipizide daily  . ADD (attention deficit disorder with hyperactivity)   . Anxiety     takes Xanax bid  . Depression     takes Celexa daily  . ADD (attention deficit disorder with hyperactivity)     takes Adderall daily  . Sleep apnea     uses CPAP nightly  .  Hypertension   . Coronary artery disease     last cath 1/14   Past Surgical History  Procedure Laterality Date  . Knee surgery  24 yrs. ago    right  . Application of wound vac  11/20/2010    and suture of bleeding post-op wound right buttock  . Rectal surgery  11/20/2010    exc. of infection perianal area right buttock (I & D)  . Ankle arthroscopy  05/13/2011    Procedure: ANKLE ARTHROSCOPY;  Surgeon: Ninetta Lights, MD;  Location: [facility];  Service: Orthopedics;  Laterality: Right;  right ankle arthroscopy with extensive debridement synovectomy and  chondroplasty  . Colonoscopy    . Total knee arthroplasty  10/13/2011    Procedure: TOTAL KNEE ARTHROPLASTY;  Surgeon: Ninetta Lights, MD;  Location: [facility];  Service: Orthopedics;  Laterality: Right;  DR MUR[facility] WANTS 90 MINUTES FOR THIS CASE  . Cardiac catheterization  12/05/2008    and in 2000  . Coronary angioplasty      2 stents  . Cardiac catheterization  1/14    no stent put in  . Hydradenitis excision Right 10/30/2012    Procedure: wide EXCISION HYDRADENITIS right buttock ;  Surgeon: Harl Bowie, MD;  Location: [facility];  Service: General;  Laterality: Right;  . Cholecystectomy N/A 10/11/2013    Procedure: LAPAROSCOPIC CHOLECYSTECTOMY ;  Surgeon: Harl Bowie, MD;  Location: [place] [facility];  Service: General;  Laterality: N/A;  . Left heart catheterization with coronary angiogram N/A 06/15/2012    Procedure: LEFT HEART CATHETERIZATION WITH CORONARY ANGIOGRAM;  Surgeon: Clent Demark, MD;  Location[facility];  Service: Cardiovascular;  Laterality: N/A;   Social History:  reports that he has been smoking Cigarettes.  He has a 25 pack-year smoking history. He has never used smokeless tobacco. He reports that he does not drink alcohol or use illicit drugs. 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 MArried, 1 child in good health.  No Known Allergies  Family History  Problem Relation Age of Onset  . Anesthesia problems Neg Hx   . Hypotension Neg Hx   . Malignant hyperthermia Neg Hx   . Pseudochol deficiency Neg Hx   . Other Mother     car accident; brain injury   Mother w stomach cancer, Father with cancer of unknown type.   Prior to Admission medications   Medication Sig Start Date End Date Taking? Authorizing Provider  albuterol (PROVENTIL HFA;VENTOLIN HFA) 108 (90 BASE) MCG/ACT inhaler Inhale 2 puffs into the lungs every 6 (six) hours as needed for wheezing.   Yes Historical Provider, MD  ALPRAZolam Duanne Moron) 1 MG tablet Take 0.5 mg by  mouth 2 (two) times daily as needed for anxiety.    Yes Historical Provider, MD  aspirin 81 MG chewable tablet Chew 81 mg by mouth 2 (two) times daily.    Yes Historical Provider, MD  atorvastatin (LIPITOR) 40 MG tablet Take 40 mg by mouth every evening.    Yes Historical Provider, MD  calcium carbonate (OS-CAL - DOSED IN MG OF ELEMENTAL CALCIUM) 1250 MG tablet Take 1 tablet by mouth at bedtime.    Yes Historical Provider, MD  carvedilol (COREG) 3.125 MG tablet Take 1 tablet (3.125 mg total) by mouth 2 (two) times daily with a meal. 12/08/12  Yes Charolette Forward, MD  citalopram (CELEXA) 40 MG tablet Take 40 mg by mouth every evening. PM   Yes Historical  Provider, MD  dicyclomine (BENTYL) 20 MG tablet Take 20 mg by mouth 2 (two) times daily as needed for spasms.    Yes Historical Provider, MD  furosemide (LASIX) 40 MG tablet Take 40 mg by mouth daily.    Yes Historical Provider, MD  glipiZIDE (GLUCOTROL) 10 MG tablet Take 10 mg by mouth 2 (two) times daily before a meal.    Yes Historical Provider, MD  ibuprofen (ADVIL,MOTRIN) 200 MG tablet Take 200 mg by mouth every 6 (six) hours as needed for moderate pain.    Yes Historical Provider, MD  metFORMIN (GLUCOPHAGE) 1000 MG tablet Take 1,000 mg by mouth 2 (two) times daily. Patient takes 1000 mg in morning and '1000mg'$  in evening, daily.   Yes Historical Provider, MD  Multiple Vitamin (MULTIVITAMIN WITH MINERALS) TABS Take 1 tablet by mouth daily.   Yes Historical Provider, MD  nitroGLYCERIN (NITROSTAT) 0.4 MG SL tablet Place 1 tablet (0.4 mg total) under the tongue every 5 (five) minutes x 3 doses as needed for chest pain. 12/08/12  Yes Charolette Forward, MD  omeprazole (PRILOSEC) 40 MG capsule Take 80 mg by mouth at bedtime.    Yes Historical Provider, MD  oxyCODONE-acetaminophen (PERCOCET) 10-325 MG per tablet Take 1 tablet by mouth every 4 (four) hours as needed for pain. 10/11/13  Yes Coralie Keens, MD  pantoprazole (PROTONIX) 40 MG tablet Take 40 mg by  mouth daily.   Yes Historical Provider, MD  potassium chloride SA (K-DUR,KLOR-CON) 20 MEQ tablet Take 20 mEq by mouth daily.   Yes Historical Provider, MD  ramipril (ALTACE) 5 MG capsule Take 5 mg by mouth daily.    Yes Historical Provider, MD  sitaGLIPtin (JANUVIA) 100 MG tablet Take 100 mg by mouth every morning.    Yes Historical Provider, MD   Physical Exam: Filed Vitals:   07/30/15 0951 07/30/15 1100 07/30/15 1115  BP: 102/76 111/71 107/73  Pulse: 80 97 78  Temp: 97.9 F (36.6 C)    Resp: '19 23 26  '$ SpO2: 97% 95% 92%    Wt Readings from Last 3 Encounters:  06/07/15 97.523 kg (215 lb)  10/30/13 110.406 kg (243 lb 6.4 oz)  10/11/13 111.131 kg (245 lb)    Physical Exam  Constitutional: He is oriented to person, place, and time. No distress.  somnolent  HENT:  Head: Normocephalic and atraumatic.  Mouth/Throat: Oropharynx is clear and moist. No oropharyngeal exudate.  Eyes: EOM are normal. Pupils are equal, round, and reactive to light. No scleral icterus.  Neck: Neck supple. No JVD present. No thyromegaly present.  Cardiovascular: Normal rate.  Exam reveals no gallop and no friction rub.   No murmur heard. Pulmonary/Chest: No stridor. No respiratory distress. He has no wheezes. He has no rales. He exhibits no tenderness.  Left lung sounds decreased  Abdominal: Soft. Bowel sounds are normal. He exhibits no distension and no mass. There is no tenderness.  Musculoskeletal: He exhibits no edema or tenderness.  Lymphadenopathy:    He has no cervical adenopathy.  Neurological: He is alert and oriented to person, place, and time. He exhibits normal muscle tone.  Skin: Skin is warm and dry. No rash noted. He is not diaphoretic. No erythema. No pallor.  Psychiatric: Mood, memory, affect and judgment normal.            Labs on Admission:  Basic Metabolic Panel:  Recent Labs Lab 07/30/15 1010  NA 137  K 5.4*  CL 100*  CO2 25  GLUCOSE 65  BUN 5*  CREATININE 0.73  CALCIUM  9.3    CBG: No results for input(s): GLUCAP in the last 168 hours.  Radiological Exams on Admission: Dg Chest Portable 1 View  07/30/2015  CLINICAL DATA:  Diminished lung sounds on the left. Shortness of breath and pneumonia. EXAM: PORTABLE CHEST 1 VIEW COMPARISON:  12/06/2012 FINDINGS: Right chest is normal. Left hemi thorax is completely opacified consistent with a combination of pulmonary infiltrate, collapse and pleural fluid. Mediastinum is not shifted. No bony abnormality seen. IMPRESSION: Complete opacification of the left hemi thorax without mediastinal shift. This is likely due to a combination of pneumonia, atelectasis and pleural fluid. Electronically Signed   By: Nelson Chimes M.D.   On: 07/30/2015 11:13   CT angio 07/29/15:  FINDINGS: . Thoracic inlet: Thyroid normal. No adenopathy or masses. . Trachea/central airways: Occlusion of the left mainstem bronchus with relatively material. Mass effect on the left mainstem bronchus with associated narrowing from the adjacent mass lesion. . Mediastinum/hilum: 2.8 x 4.7 cm lymph node conglomerate subcarinal location. Several mildly enlarged paraesophageal lymph nodes also identified. Small amount of fluid within the esophagus. Mild left to right mediastinal shift. 1.2 cm AP window lymph node. Marland Kitchen Heart: Normal size. Minimal pericardial effusion. Extensive coronary artery calcifications. Likely LAD stent. Mild subendocardial hypoattenuation involving the ventricular apex with suggestion of fat attenuation.. Mild thinning of the ventricular apex. . Vessels: No occlusion, dissection, or aneurysm. No evidence for pulmonary embolism. Mild aortic atherosclerosis. Generalized attenuated appearance of vessels throughout the left lung. . Lungs: Complete collapse of the left lung. 3 mm nodule right upper lobe close associated with the right minor fissure. 4 x 3 mm right middle lobe pulmonary nodule image #56. 3 mm left lower lobe, nodule image #58. Marland Kitchen Pleura:  Large left-sided pleural effusion. No pneumothorax. . Musculoskeletal: No acute bone or soft tissue abnormalities. Marland Kitchen Upper abdomen: Unremarkable . Additional comments: None   IMPRESSION: Complete collapse of the left lung with large left-sided pleural effusion. Given the concomitant mediastinal adenopathy this raises suspicion for an endobronchial/left hilar mass lesion with lymph node metastasis. Consider PET CT and/or bronchoscopy for  further evaluation. Cannot exclude mucous plugging with reactive lymphadenopathy, however this is considered much less likely. Atherosclerosis including coronary disease. Several right-sided pulmonary nodules are identified measuring up to 3 mm. Findings which can be seen in prior myocardial infarction. No visualized pulmonary embolus. Decrease blood flow throughout the left lung likely related to hypoventilatory state.  EKG:   Assessment/Plan Active Problems:   DOE (dyspnea on exertion)   Hypertension   Diabetes (HCC)   Coronary artery disease   Anxiety   Lung mass   Pleural effusion, left  Dg Chest Portable 1 View  07/30/2015  CLINICAL DATA:  Diminished lung sounds on the left. Shortness of breath and pneumonia. EXAM: PORTABLE CHEST 1 VIEW COMPARISON:  12/06/2012 FINDINGS: Right chest is normal. Left hemi thorax is completely opacified consistent with a combination of pulmonary infiltrate, collapse and pleural fluid. Mediastinum is not shifted. No bony abnormality seen. IMPRESSION: Complete opacification of the left hemi thorax without mediastinal shift. This is likely due to a combination of pneumonia, atelectasis and pleural fluid. Electronically Signed   By: Nelson Chimes M.D.   On: 07/30/2015 11:13     Assessment/Plan: 50 y.o.   Acute respiratory failure with hypoxia secondary to large left pleural effusion Admit to the SDU for close monitoring overnight.    Albuterol nebs every 6 hours. 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 Several mildly enlarged paraesophageal lymph nodes also identified. Small amount of fluid within the esophagus. Mild left to right mediastinal shift. 1.2 cm AP window lymph nodes. Suspect SCC   Check LDH After Left throracentesis, will repeat CT chest with contrast to further evaluate mass,reommend biopsy of lymph node while inpatient for diagnosis vesus bronchoscopy Recommend Staging CT A/P and MRI brain to complete staging to rule out occult mets if repeat CT chest confirms malignant appearance of the lung mass mass.   Large Left Pleural Effusion, likely malignant in the setting of COPD and smoking history. This was found per CT angio in [place] on 2/14  Thoracic surgery for  Diagnostic Left Thoracentesis    Pneumonia, likely postobstructive due to left lung mass. He is afebrile, WBC 6.9 Will hold antibiotics for now.    Diabetes mellitus type 2 non-insulin last Hb A1C 6.5 on 12/07/12 Current sugar levels low at 65  - Hold oral medications - SSI   Hypertension  BP 101/75 mmHg  Pulse 80  Temp(Src) 97.9 F (36.6 C)  Resp 24  SpO2 91% Well controlled Continue home anti-hypertensive medications with Altace, Lasix, Coreg, ASA  Pericardial effusion, minimal per CTA findings.  2 D echo   Hyperkalemia, current K at 5.4. No BL available Will monitor for now while in stepdown.  No intervention is indicated at this time  Sleep Apnea Currently not on CPAP OSats normal  Duoneb as needed Os as needed IS at bedside  Deconditioning OT/PT Tobacco Cessation while in hospital Nutrition Evaluation due to 30 lb unintentional weight loss  .Code Status: Full Code  DVT Prophylaxis: Heparin per Pharmacy after throacentesis, may use SCDs till then.  Family Communication: at bedside Disposition Plan: Pending Improvement. Admitted for observation in stepdown bed. Expected LOS 24-48 hrs    [facility] E,PA-C [facility] [email or web address] Password [email or web address]

## 2015-07-30 NOTE — ED Notes (Signed)
Attempted report x1. 

## 2015-07-31 ENCOUNTER — Inpatient Hospital Stay (HOSPITAL_COMMUNITY): Payer: Medicare HMO

## 2015-07-31 ENCOUNTER — Encounter (HOSPITAL_COMMUNITY): Payer: Self-pay | Admitting: Radiology

## 2015-07-31 DIAGNOSIS — E0800 Diabetes mellitus due to underlying condition with hyperosmolarity without nonketotic hyperglycemic-hyperosmolar coma (NKHHC): Secondary | ICD-10-CM | POA: Diagnosis present

## 2015-07-31 DIAGNOSIS — R918 Other nonspecific abnormal finding of lung field: Secondary | ICD-10-CM

## 2015-07-31 DIAGNOSIS — G4733 Obstructive sleep apnea (adult) (pediatric): Secondary | ICD-10-CM

## 2015-07-31 DIAGNOSIS — R599 Enlarged lymph nodes, unspecified: Secondary | ICD-10-CM

## 2015-07-31 DIAGNOSIS — J90 Pleural effusion, not elsewhere classified: Secondary | ICD-10-CM

## 2015-07-31 DIAGNOSIS — I319 Disease of pericardium, unspecified: Secondary | ICD-10-CM

## 2015-07-31 DIAGNOSIS — R0902 Hypoxemia: Secondary | ICD-10-CM

## 2015-07-31 LAB — BASIC METABOLIC PANEL
Anion gap: 12 (ref 5–15)
BUN: 8 mg/dL (ref 6–20)
CALCIUM: 9 mg/dL (ref 8.9–10.3)
CO2: 27 mmol/L (ref 22–32)
CREATININE: 0.68 mg/dL (ref 0.61–1.24)
Chloride: 98 mmol/L — ABNORMAL LOW (ref 101–111)
GFR calc Af Amer: >60 mL/min (ref >60)
GLUCOSE: 85 mg/dL (ref 65–99)
POTASSIUM: 4.2 mmol/L (ref 3.5–5.1)
SODIUM: 137 mmol/L (ref 135–145)

## 2015-07-31 LAB — CBC
HCT: 42.7 % (ref 39.0–52.0)
Hemoglobin: 13.9 g/dL (ref 13.0–17.0)
MCH: 30 pg (ref 26.0–34.0)
MCHC: 32.6 g/dL (ref 30.0–36.0)
MCV: 92 fL (ref 78.0–100.0)
PLATELETS: 270 10*3/uL (ref 150–400)
RBC: 4.64 MIL/uL (ref 4.22–5.81)
RDW: 14.6 % (ref 11.5–15.5)
WBC: 10.5 10*3/uL (ref 4.0–10.5)

## 2015-07-31 LAB — PROTIME-INR
INR: 1.23 (ref 0.00–1.49)
PROTHROMBIN TIME: 15.7 s — AB (ref 11.6–15.2)

## 2015-07-31 LAB — GLUCOSE, CAPILLARY
GLUCOSE-CAPILLARY: 121 mg/dL — AB (ref 65–99)
GLUCOSE-CAPILLARY: 242 mg/dL — AB (ref 65–99)
GLUCOSE-CAPILLARY: 281 mg/dL — AB (ref 65–99)
Glucose-Capillary: 176 mg/dL — ABNORMAL HIGH (ref 65–99)

## 2015-07-31 LAB — LYMPHOCYTE SUBSETS, FLOW CYTOMETRY (INPT)

## 2015-07-31 LAB — TRIGLYCERIDES, BODY FLUIDS: TRIGLYCERIDES FL: 38 mg/dL

## 2015-07-31 MED ORDER — INSULIN ASPART 100 UNIT/ML ~~LOC~~ SOLN
0.0000 [IU] | SUBCUTANEOUS | Status: DC
  Administered 2015-07-31 – 2015-08-01 (×3): 3 [IU] via SUBCUTANEOUS
  Administered 2015-08-01 (×2): 2 [IU] via SUBCUTANEOUS

## 2015-07-31 MED ORDER — IOHEXOL 300 MG/ML  SOLN
25.0000 mL | INTRAMUSCULAR | Status: AC
  Administered 2015-07-31 (×2): 25 mL via ORAL

## 2015-07-31 MED ORDER — IOHEXOL 300 MG/ML  SOLN
100.0000 mL | Freq: Once | INTRAMUSCULAR | Status: AC | PRN
  Administered 2015-07-31: 100 mL via INTRAVENOUS

## 2015-07-31 MED ORDER — IOHEXOL 300 MG/ML  SOLN
75.0000 mL | Freq: Once | INTRAMUSCULAR | Status: AC | PRN
  Administered 2015-07-31: 75 mL via INTRAVENOUS

## 2015-07-31 MED ORDER — ENSURE ENLIVE PO LIQD
237.0000 mL | Freq: Two times a day (BID) | ORAL | Status: DC
  Administered 2015-07-31 – 2015-08-07 (×10): 237 mL via ORAL

## 2015-07-31 NOTE — Progress Notes (Signed)
Attempted to see pt this am.  Pt in ECHO. Will attempt back as schedule allows. Jinger [place], [place] [phone]

## 2015-07-31 NOTE — Consult Note (Signed)
Name: Cody [name] MRN: [id number] DOB: [birth year]    ADMISSION DATE:  07/30/2015 CONSULTATION DATE:  07/31/15  REFERRING MD :  Sherral Hammers  CHIEF COMPLAINT:  SOB   HISTORY OF PRESENT ILLNESS:  Cody [name] is a 50 y.o. male with a PMH as outlined below.  He presented to med center in [place] with SOB, dry cough, and congestion.  Symptoms began 3 weeks prior and at the time he was reportedly diagnosed with PNA.  He was placed on abx but reports that symptoms did not resolve.  At [facility], CT of the chest revealed large left sided pleural effusion, mediastinal adenopathy.  There were also right sided nodules seen that measured up to 56m.  Findings were suspicious for endobronchial / hilar mass with lymph node metastases.  [facility] to sleep and get some rest.  [facility].  He was evaluated by IR and had left sided thoracentesis performed which yielded 2L of amber colored fluid.  Fluid was sent for routine studies and cytology.  PCCM was consulted 02/16 for further recs.  During our visit with pt and family, he reported that he is a current smoker, 30 pack year history.  He has had ~25lb - 30lb unintentional weight loss in the past 2 months.  He has also had decreased appetite along with subjective feeling of fatigue.  Denies any hemoptysis, chest pain, abd pain, N/V/D, abd pain, myalgias.  PAST MEDICAL HISTORY :   has a past medical history of Hyperlipidemia; Head injury (1988); Stented coronary artery (2000, 2010); Heart attack ([place] (2000); Shortness of breath; Peripheral edema; Pneumonia; Headache(784.0); Arthritis; Joint pain; Joint swelling; Bruises easily; GERD (gastroesophageal reflux disease); Urinary urgency; Nocturia; Diabetes mellitus; ADD (attention deficit disorder with hyperactivity); Anxiety; Depression; ADD (attention deficit disorder with hyperactivity); Sleep apnea;  Hypertension; Coronary artery disease; CHF (congestive heart failure) ([place]; and Asthma.  has past surgical history that includes Knee surgery (24 yrs. ago); Application if wound vac (11/20/2010); Rectal surgery (11/20/2010); Ankle arthroscopy (05/13/2011); Colonoscopy; Total knee arthroplasty (10/13/2011); Cardiac catheterization (12/05/2008); Coronary angioplasty; Cardiac catheterization (1/14); Hydradenitis excision (Right, 10/30/2012); Cholecystectomy (N/A, 10/11/2013); and left heart catheterization with coronary angiogram (N/A, 06/15/2012). Prior to Admission medications   Medication Sig Start Date End Date Taking? Authorizing Provider  albuterol (PROVENTIL HFA;VENTOLIN HFA) 108 (90 BASE) MCG/ACT inhaler Inhale 2 puffs into the lungs every 6 (six) hours as needed for wheezing.   Yes Historical Provider, MD  ALPRAZolam (Duanne Moron 1 MG tablet Take 0.5 mg by mouth 2 (two) times daily as needed for anxiety.    Yes Historical Provider, MD  aspirin 81 MG chewable tablet Chew 81 mg by mouth 2 (two) times daily.    Yes Historical Provider, MD  atorvastatin (LIPITOR) 40 MG tablet Take 40 mg by mouth every evening.    Yes Historical Provider, MD  calcium carbonate (OS-CAL - DOSED IN MG OF ELEMENTAL CALCIUM) 1250 MG tablet Take 1 tablet by mouth at bedtime.    Yes Historical Provider, MD  carvedilol (COREG) 3.125 MG tablet Take 1 tablet (3.125 mg total) by mouth 2 (two) times daily with a meal. 12/08/12  Yes MCharolette Forward MD  citalopram (CELEXA) 40 MG tablet Take 40 mg by mouth every evening. PM   Yes Historical Provider, MD  dicyclomine (BENTYL) 20 MG tablet Take 20 mg by mouth 2 (two) times daily as needed for spasms.  Yes Historical Provider, MD  furosemide (LASIX) 40 MG tablet Take 40 mg by mouth daily.    Yes Historical Provider, MD  glipiZIDE (GLUCOTROL) 10 MG tablet Take 10 mg by mouth 2 (two) times daily before a meal.    Yes Historical Provider, MD  ibuprofen (ADVIL,MOTRIN) 200 MG tablet Take 200 mg by mouth  every 6 (six) hours as needed for moderate pain.    Yes Historical Provider, MD  metFORMIN (GLUCOPHAGE) 1000 MG tablet Take 1,000 mg by mouth 2 (two) times daily. Patient takes 1000 mg in morning and '1000mg'$  in evening, daily.   Yes Historical Provider, MD  Multiple Vitamin (MULTIVITAMIN WITH MINERALS) TABS Take 1 tablet by mouth daily.   Yes Historical Provider, MD  nitroGLYCERIN (NITROSTAT) 0.4 MG SL tablet Place 1 tablet (0.4 mg total) under the tongue every 5 (five) minutes x 3 doses as needed for chest pain. 12/08/12  Yes Charolette Forward, MD  omeprazole (PRILOSEC) 40 MG capsule Take 80 mg by mouth at bedtime.    Yes Historical Provider, MD  oxyCODONE-acetaminophen (PERCOCET) 10-325 MG per tablet Take 1 tablet by mouth every 4 (four) hours as needed for pain. 10/11/13  Yes Coralie Keens, MD  pantoprazole (PROTONIX) 40 MG tablet Take 40 mg by mouth daily.   Yes Historical Provider, MD  potassium chloride SA (K-DUR,KLOR-CON) 20 MEQ tablet Take 20 mEq by mouth daily.   Yes Historical Provider, MD  ramipril (ALTACE) 5 MG capsule Take 5 mg by mouth daily.    Yes Historical Provider, MD  sitaGLIPtin (JANUVIA) 100 MG tablet Take 100 mg by mouth every morning.    Yes Historical Provider, MD   No Known Allergies  FAMILY HISTORY:  family history includes Other in his mother. There is no history of Anesthesia problems, Hypotension, Malignant hyperthermia, or Pseudochol deficiency. SOCIAL HISTORY:  reports that he has been smoking Cigarettes.  He has a 25 pack-year smoking history. He has never used smokeless tobacco. He reports that he does not drink alcohol or use illicit drugs.  REVIEW OF SYSTEMS:   All negative; except for those that are bolded, which indicate positives.  Constitutional: weight loss, weight gain, night sweats, fevers, chills, fatigue, weakness.  HEENT: headaches, sore throat, sneezing, nasal congestion, post nasal drip, difficulty swallowing, tooth/dental problems, visual complaints,  visual changes, ear aches. Neuro: difficulty with speech, weakness, numbness, ataxia. CV:  chest pain, orthopnea, PND, swelling in lower extremities, dizziness, palpitations, syncope.  Resp: cough, hemoptysis, dyspnea, wheezing, chest congestion. GI  heartburn, indigestion, abdominal pain, nausea, vomiting, diarrhea, constipation, change in bowel habits, loss of appetite, hematemesis, melena, hematochezia.  GU: dysuria, change in color of urine, urgency or frequency, flank pain, hematuria. MSK: joint pain or swelling, decreased range of motion. Psych: change in mood or affect, depression, anxiety, suicidal ideations, homicidal ideations. Skin: rash, itching, bruising.    SUBJECTIVE:  Currently denies chest pain.  SOB has improved.  Is keen on getting definite diagnosis and if malignancy is identified, he is eager to seek treatment including chemo / radiation.  VITAL SIGNS: Temp:  [97.9 F (36.6 C)-99 F (37.2 C)] 97.9 F (36.6 C) (02/16 1445) Pulse Rate:  [79-99] 87 (02/16 1214) Resp:  [16-26] 18 (02/16 1214) BP: (101-116)/(61-88) 111/80 mmHg (02/16 1214) SpO2:  [92 %-98 %] 95 % (02/16 1214) Weight:  [93.8 kg (206 lb 12.7 oz)-95.7 kg (210 lb 15.7 oz)] 93.8 kg (206 lb 12.7 oz) (02/16 0401)  PHYSICAL EXAMINATION: General: Middle aged male, resting in bed visiting  with family, in NAD. Neuro: A&O x 3, non-focal.  HEENT: [place]/AT. PERRL, sclerae anicteric. Cardiovascular: RRR, no M/R/G.  Lungs: Respirations even and unlabored.  Diminished on left.  Wheezes on right. Abdomen: BS x 4, soft, NT/ND.  Musculoskeletal: No gross deformities, no edema.  Skin: Intact, warm, no rashes.    Recent Labs Lab 07/30/15 1010 07/31/15 0520  NA 137 137  K 5.4* 4.2  CL 100* 98*  CO2 25 27  BUN 5* 8  CREATININE 0.73 0.68  GLUCOSE 65 85    Recent Labs Lab 07/30/15 1153 07/31/15 0520  HGB 12.7* 13.9  HCT 39.2 42.7  WBC 9.6 10.5  PLT 246 270   Dg Chest 1 View  07/30/2015  CLINICAL DATA:   Post left thoracentesis EXAM: CHEST  1 VIEW COMPARISON:  Earlier film of the same day FINDINGS: No pneumothorax. Persistent opacification of the left hemi thorax. Can't exclude left pleural residual left pleural effusion. Narrowing of the distal left mainstem bronchus. Right lung clear. Heart size difficult to assess due to adjacent opacity. Visualized skeletal structures are unremarkable. IMPRESSION: 1. No pneumothorax post thoracentesis. 2. Persistent opacification of the left hemi thorax with left mainstem bronchus narrowing suggesting central obstructing lesion. Consider CT chest with contrast for further assessment. Electronically Signed   By: Lucrezia Europe M.D.   On: 07/30/2015 14:52   Ct Chest W Contrast  07/31/2015  CLINICAL DATA:  Pleural effusion.  Left lung mass. EXAM: CT CHEST WITH CONTRAST TECHNIQUE: Multidetector CT imaging of the chest was performed during intravenous contrast administration. CONTRAST:  59m OMNIPAQUE IOHEXOL 300 MG/ML  SOLN I was called to the evaluate the patient at 0506 hours on 07/31/2015 for complaint of contrast extravasation. Estimated volume of extravasation was about 25-30 mL. On examination, the patient was alert and oriented and in no distress. Patient was sitting in the wheelchair with ice pack over the right antecubital fossa. IV cannula was in place in the right antecubital fossa with small amount of bruising which, by history, was present prior to the extravasation. There was approximately 5-6 cm area of swelling in the antecubital fossa anteriorly. The patient states this area was sore to palpation. No redness, blistering, or skin breakdown. Area of swelling was soft to palpation. Distal pulses and capillary refill were normal. Grip strength was normal. Patient was advised place ice packs over the area 3 times a day for the next 3 days or until resolution. Elevation of the extremity was recommended. Patient was instructed to seek medical attention if there is any  change in sensation, increased pain, increased swelling, skin breakdown, blistering, or change in skin color. Contrast extravasation protocol orders were entered by the technologist. COMPARISON:  Chest radiograph 07/30/2015 FINDINGS: Normal heart size. Prominent Coronary artery calcifications. Cardiac stents are likely present. Normal caliber thoracic aorta. Lymphadenopathy in the chest with subcarinal lymph nodes measuring about 2.5 cm short axis dimension. Right superior paratracheal nodes measuring about 11 mm diameter. Esophagus is decompressed. There is a moderate left pleural effusion with collapse and consolidation of much of the left lung. Minimal residual aeration is present. The left mainstem bronchus is narrowed with probable left perihilar mass lesion. Presumed mass is obscured by the surrounding postobstructive change and effusion. Mild atelectasis in the right lung base. No pneumothorax. Included portions of the upper abdominal organs demonstrate bilateral adrenal gland nodules, measuring 2.8 cm on the right and 4.2 cm on the left. These are likely metastatic. Surgical absence of the gallbladder. Scattered soft  tissue nodules in the subcutaneous fat around the liver and in the omentum may represent metastatic lymph nodes or peritoneal deposits. Degenerative changes throughout the thoracic spine. No destructive bone lesions are appreciated. Nonobstructing stones in the right kidney. IMPRESSION: Moderate left pleural effusion with collapse and consolidation of the left lung. Narrowing of the left mainstem bronchus likely indicating an obstructing mass lesion although the mass is obscured to visualization by the surrounding postobstructive changes and effusion. Mild atelectasis in the right lung base. Probable metastatic mediastinal lymph nodes, bilateral adrenal gland nodules, and abdominal peritoneal deposits. Note that the examination was complicated by contrast extravasation of about 25-30 cc into the  right antecubital fossa. Electronically Signed   By: Lucienne Capers M.D.   On: 07/31/2015 05:55   Dg Chest Portable 1 View  07/30/2015  CLINICAL DATA:  Diminished lung sounds on the left. Shortness of breath and pneumonia. EXAM: PORTABLE CHEST 1 VIEW COMPARISON:  12/06/2012 FINDINGS: Right chest is normal. Left hemi thorax is completely opacified consistent with a combination of pulmonary infiltrate, collapse and pleural fluid. Mediastinum is not shifted. No bony abnormality seen. IMPRESSION: Complete opacification of the left hemi thorax without mediastinal shift. This is likely due to a combination of pneumonia, atelectasis and pleural fluid. Electronically Signed   By: Nelson Chimes M.D.   On: 07/30/2015 11:13   US Thoracentesis Asp Pleural Space W/img Guide  07/30/2015  INDICATION: Shortness of breath. Recent pneumonia. Large left pleural effusion. Request for diagnostic and therapeutic thoracentesis. EXAM: ULTRASOUND GUIDED LEFT THORACENTESIS MEDICATIONS: 1% Lidocaine. COMPLICATIONS: None immediate. PROCEDURE: An ultrasound guided thoracentesis was thoroughly discussed with the patient and questions answered. The benefits, risks, alternatives and complications were also discussed. The patient understands and wishes to proceed with the procedure. Written consent was obtained. Ultrasound was performed to localize and Gracyn an adequate pocket of fluid in the left chest. The area was then prepped and draped in the normal sterile fashion. 1% Lidocaine was used for local anesthesia. Under ultrasound guidance a 6 Fr Safe-T-Centesis catheter was introduced. Thoracentesis was performed. The catheter was removed and a dressing applied. FINDINGS: A total of approximately 2 liters of amber fluid was removed. Samples were sent to the laboratory as requested by the clinical team. IMPRESSION: Successful ultrasound guided left thoracentesis yielding 2 liters of pleural fluid. Read by:  Gareth Eagle, PA-C Electronically  Signed   By: Marybelle Killings M.D.   On: 07/30/2015 14:55    STUDIES:  CT chest 02/16 > mod left effusion with collapse and consolidation of left lung.  Narrowing of left mainstem bronchus likely indicating obstructing mass lesion.  Mild atx in right lung base.  Probable metastatic mediastinal lymph nodes, bilateral adrenal gland nodules, and abdominal peritoneal deposits.  SIGNIFICANT EVENTS  02/14 > presented to [facility] center with SOB.  CT scan revealed large effusion and possible endobronchial lesion. 02/15 > presented to [facility] for further evaluation / management.  Had left thoracentesis with 2L amber fluid removed - sent for routine labs and cytology. 02/16 > PCCM consulted.   ASSESSMENT / PLAN:  Left pleural effusion - s/p diagnostic and therapeutic thora 02/14 by IR. Concern for endobronchial lesion of left mainstem bronchus with probable metastatic mediastinal lymphadenopathy. Atelectasis. Plan: F/u on routine studies and cytology from thoracentesis. If cytology negative, then likely proceed with bronch for tissue sampling. Consult oncology. F/u on CT head and abd / pelv for possible disease progression.   Montey Hora, PA - [facility] Pager: [phone]  or [phone] 07/31/2015, 5:30 PM   Attending Note:  Assessment and Plan:  50 year old male with 30 pack year history of smoking who presents with 2 months of weight loss and SOB. Was noted to have complete whiteout of the left lung. CT was performed that showed pleural effusion and external compression of the left mainstem bronchus via lymph nodes that results in an entrapped lung. Thora done with 1.2 liters out. PCCM consult called for bronchoscopy.  Discussed with [facility]-MD.  Pleural Effusion: Transudative none infected appearing fluid. - F/u on fluid cytology. - Frequent radiologic testing.  Mediastinal LAN: cause of external compression of the  left main. - If cytology is negative then will need a bronch with a wang. - May need to radiate that area to make the left lung useable again or use a stent.  Lung Mass: - If cytology is negative then bronch with a wang needle aspiration. - May be able to explore presence of endobronchial lesion as well. - Will need an oncology consult. - May need a CT of the lung after complete drainage to evaluate if the lung is trapped and to see if there are lung masses since lung is completely collapsed. - Head and abdominal CT.  Hypoxemia: - Titrate O2 for sat of 88--92%. - May need an ambulatory desaturation study prior to discharge to assess need for home O2. '- Radiation will help opening the left lung.  Rush Farmer, M.D. [facility] Pulmonary/Critical Care Medicine. Pager: [phone]. After hours pager: [phone].  07/31/2015, 5:09 PM

## 2015-07-31 NOTE — Care Management Note (Addendum)
Case Management Note  Patient Details  Name: Cody [name] MRN: [id number] Date of Birth: [birth year]  Subjective/Objective:        Date: 07/31/15 Spoke with patient at the bedside.  Introduced self as Tourist information centre manager and explained role in discharge planning and how to be reached.  Verified patient lives in town, with spouse, pta indep. Expressed potential need for no  DME.  Verified patient anticipates to go home with family,  at time of discharge and will have full-time supervision by family at this time to best of their knowledge. Patient denied needing help with their medication.  Patient drives to MD appointments.  Verified patient has PCP Dr. Melford Aase.   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             Action/Plan:   Expected Discharge Date:                  Expected Discharge Plan:  Home/Self Care  In-House Referral:     Discharge planning Services  CM Consult  Post Acute Care Choice:    Choice offered to:     DME Arranged:    DME Agency:     HH Arranged:    HH Agency:     Status of Service:  In process, will continue to follow  Medicare Important Message Given:    Date Medicare IM Given:    Medicare IM give by:    Date Additional Medicare IM Given:    Additional Medicare Important Message give by:     If discussed at [place] of Stay Meetings, dates discussed:    Additional Comments:  Zenon Mayo, RN 07/31/2015, 3:15 PM

## 2015-07-31 NOTE — Progress Notes (Signed)
Initial Nutrition Assessment  DOCUMENTATION CODES:   Severe malnutrition in context of acute illness/injury, Obesity unspecified  INTERVENTION:    Ensure Enlive PO BID, each supplement provides 350 kcal and 20 grams of protein  NUTRITION DIAGNOSIS:   Malnutrition related to acute illness as evidenced by moderate depletions of muscle mass, percent weight loss (14% weight loss within 6 months).  GOAL:   Patient will meet greater than or equal to 90% of their needs  MONITOR:   PO intake, Supplement acceptance, Labs, Weight trends, I & O's  REASON FOR ASSESSMENT:   Consult Poor PO  ASSESSMENT:   50 y.o. male with a Past Medical History of HTN, HLD, CAD status post coronary catheterization and stent placement, GERD, DM, ADD, OSA who presents with acute respiratory failure with O2 sats as low as 86% likely secondary to massive left lung effusion likely secondary to malignancy.   Patient reports that he has been losing weight at home despite eating well. Weight loss likely due to suspected malignancy. Nutrition-Focused physical exam completed. Findings are no fat depletion, moderate muscle depletion, and no edema. Patient reports that he ate all of his breakfast this AM. He could use a PO supplement to maximize oral intake of protein and calories to prevent further weight loss.  Diet Order:  Diet Carb Modified Fluid consistency:: Thin; Room service appropriate?: Yes  Skin:  Reviewed, no issues  Last BM:  2/14  Height:   Ht Readings from Last 1 Encounters:  07/31/15 '5\' 8"'$  (1.727 m)    Weight:   Wt Readings from Last 1 Encounters:  07/31/15 206 lb 12.7 oz (93.8 kg)    Ideal Body Weight:  70 kg  BMI:  Body mass index is 31.45 kg/(m^2).  Estimated Nutritional Needs:   Kcal:  2200-2400  Protein:  110-130 gm  Fluid:  2.2-2.4 L  EDUCATION NEEDS:   No education needs identified at this time  Molli Barrows, [place], [place], [place] Pager [phone] After Hours Pager  [phone]

## 2015-07-31 NOTE — Progress Notes (Signed)
  Echocardiogram 2D Echocardiogram has been performed.  Bobbye Charleston 07/31/2015, 11:21 AM

## 2015-07-31 NOTE — Progress Notes (Signed)
TEAM 1 - Stepdown/ICU TEAM Progress Note  Cody [name]:[id number] DOB: 08/12/1965 DOA: 07/30/2015 PCP: Chesley Noon, MD  Admit HPI / Brief Narrative: 50 y.o. WM PMHx  TBI, ADD, Anxiety, Depression, HTN, CAD native artery, MI, DM Type2, HLD, OSA on CPAP  Presenting today ED After presenting to [facility] in [place] ED with increasing shortness of breath, cough, congestion. He had been diagnosed with pneumonia 3 weeks prior, but the symptoms were not relieved with antibiotics as an outpatient. CT performed at [facility], [place] shown large left-sided pleural effusion, mediastinal adenopathy suspicious for endobronchial/hilar mass with lymph node metastases. Also seen, centered right-sided pulmonary nodules up to 3 mm. No PE was noted. Because of the lengthy wait at [facility] the patient decided to present here for further evaluation Denies fevers, chills, night sweats or mucositis. Denies hemoptysis. Denies any chest pain or palpitations. Denies lower extremity swelling. No confusion was reported. He denies any vision changes or double vision. He denies any headaches. He denies any abdominal pain,. He has been more nauseous without vomiting over the last week, without dizziness or vertigo. He has decreased appetite due to failure to thrive with unintentional 30 lbs weight loss.  At the ED, his Vitals are stable, OSATs normal. He was afebrile. CBC and CMET remarkable for K 5.4 with neg Tn at 0. He will be admitted for left throracentesis and further workup.   HPI/Subjective: 2/16 A/O 4, negative N/V/SOB   Assessment/Plan:  Acute respiratory failure with hypoxia secondary to large left pleural effusion -Resolved with thoracentesis  Lung mass, likely due to malignancy  -CT angio on 2/14 2.8 x 4.7 cm lymph node conglomerate subcarinal location. Several mildly enlarged paraesophageal lymph nodes also identified. Small amount of fluid within the esophagus. Mild left to  right mediastinal shift. 1.2 cm AP window lymph nodes. Suspect SCC  -LDH elevated at 683 -PCCM consulted bronchoscopy for biopsy? Stenting of left mainstem bronchus? -CT head W/WO contrast pending -CT abdomen pelvis W contrast pending  Large Left Pleural Effusion/ -2/15 S/P left thoracentesis 2 L -Most likely malignant given parents and smoking history   Postobstructive Pneumonia 2dary to extrinsic pressure; intrinsic invasion? -Afebrile, negative leukocytosis hold antibiotics  Diabetes mellitus type 2  -Hemoglobin A1c pending  -Lipid panel pending -Increase to moderate SSI   Hypertension  -Coreg 3.125 mg  BID -Lasix 40 mg daily  -Ramipril 5 mg daily  Chronic Diastolic CHF -See hypertension   Hyperkalemia, current K at 5.4. No BL available -Resolved   Sleep Apnea  -CPAP per RT       Code Status: FULL Family Communication: no family present at time of exam Disposition Plan: Dependent upon completion of cancer workup    Consultants: Dr.Wesam Kathryne Sharper PC CM    Procedure/Significant Events: 2/14 CT Angiogram at [facility];-Occlusion of the left mainstem bronchus with relatively material. Mass effect on the left mainstem bronchus with associated narrowing from the adjacent mass lesion. -Mediastinum/hila: 2.8 x 4.7 cm lymph node conglomerate subcarinal location. Several mildly enlarged Paraesophageal lymph nodes also identified. -Mild left to right mediastinal shift. 1.2 cm AP window LN. -Heart; Minimal pericardial effusion. -Complete collapse  left lung. 3 mm nodule right upper lobe close associated with the right minor fissure. 4 x 3 mm RML pulmonary nodule image -3 mm LLL nodule . 2/15 left thoracentesis; 2 L amber fluid removed 2/16 CT chest with contrast; -Moderate left pleural effusion with collapse and consolidation of the left lung. -narrowing of the left  mainstem bronchus likely indicating an obstructing mass lesion although the mass is obscured to visualization  by the surrounding postobstructive changes and effusion.  -Mild atelectasis in the right lung base.- Probable metastatic mediastinal lymph nodes, bilateral adrenal gland nodules,and abdominal peritoneal deposits. 2/16 echocardiogram;- Left ventricle: mildconcentric hypertrophy.- LVEF= 55% to 60%. -(grade 2 diastolic dysfunction).- Pericardium, moderate-sized leftpleural effusion.   Culture 2/15 left pleural fluid pending 2/15 left pleural fluid cytology pending  Antibiotics: NA  DVT prophylaxis: Subcutaneous heparin   Devices NA  LINES / TUBES:  NA    Continuous Infusions:   Objective: VITAL SIGNS: Temp: 97.9 F (36.6 C) (02/16 1445) Temp Source: Oral (02/16 1445) BP: 111/80 mmHg (02/16 1214) Pulse Rate: 87 (02/16 1214) SPO2; FIO2:   Intake/Output Summary (Last 24 hours) at 07/31/15 1912 Last data filed at 07/31/15 1445  Gross per 24 hour  Intake    630 ml  Output      0 ml  Net    630 ml     Exam: General: A/O 4, No acute respiratory distress at rest Eyes: Negative headache, negative scleral hemorrhage ENT: Negative Runny nose, negative gingival bleeding, Neck:  Negative scars, masses, torticollis, lymphadenopathy, JVD Lungs: seriously diminished LUL breath sounds, absent breath sounds lingula and LLL, RUL/RML/RLL mild expiratory wheezing, negative crackles Cardiovascular: Regular rate and rhythm without murmur gallop or rub normal S1 and S2 Abdomen:negative abdominal pain, nondistended, positive soft, bowel sounds, no rebound, no ascites, no appreciable mass Extremities: No significant cyanosis, clubbing, or edema bilateral lower extremities Psychiatric:  Negative depression, negative anxiety, negative fatigue, negative mania  Neurologic:  Cranial nerves II through XII intact, tongue/uvula midline, all extremities muscle strength 5/5, sensation intact throughout, negative dysarthria, negative expressive aphasia, negative receptive aphasia.   Data  Reviewed: Basic Metabolic Panel:  Recent Labs Lab 07/30/15 1010 07/31/15 0520  NA 137 137  K 5.4* 4.2  CL 100* 98*  CO2 25 27  GLUCOSE 65 85  BUN 5* 8  CREATININE 0.73 0.68  CALCIUM 9.3 9.0   Liver Function Tests: No results for input(s): AST, ALT, ALKPHOS, BILITOT, PROT, ALBUMIN in the last 168 hours. No results for input(s): LIPASE, AMYLASE in the last 168 hours. No results for input(s): AMMONIA in the last 168 hours. CBC:  Recent Labs Lab 07/30/15 1153 07/31/15 0520  WBC 9.6 10.5  NEUTROABS 6.8  --   HGB 12.7* 13.9  HCT 39.2 42.7  MCV 92.2 92.0  PLT 246 270   Cardiac Enzymes: No results for input(s): CKTOTAL, CKMB, CKMBINDEX, TROPONINI in the last 168 hours. BNP (last 3 results) No results for input(s): BNP in the last 8760 hours.  ProBNP (last 3 results) No results for input(s): PROBNP in the last 8760 hours.  CBG:  Recent Labs Lab 07/30/15 2152 07/30/15 2354 07/31/15 0809 07/31/15 1213 07/31/15 1733  GLUCAP 80 132* 121* 242* 281*    Recent Results (from the past 240 hour(s))  Culture, body fluid-bottle     Status: None (Preliminary result)   Collection Time: 07/30/15  2:23 PM  Result Value Ref Range Status   Specimen Description FLUID PLEURAL LEFT  Final   Special Requests BOTTLES DRAWN AEROBIC AND ANAEROBIC 10CC  Final   Culture NO GROWTH 1 DAY  Final   Report Status PENDING  Incomplete  Gram stain     Status: None   Collection Time: 07/30/15  2:23 PM  Result Value Ref Range Status   Specimen Description FLUID PLEURAL LEFT  Final  Special Requests NONE  Final   Gram Stain   Final    CYTOSPIN SMEAR WBC PRESENT,BOTH PMN AND MONONUCLEAR NO ORGANISMS SEEN    Report Status 07/30/2015 FINAL  Final  MRSA PCR Screening     Status: None   Collection Time: 07/30/15  7:18 PM  Result Value Ref Range Status   MRSA by PCR NEGATIVE NEGATIVE Final    Comment:        The GeneXpert MRSA Assay (FDA approved for NASAL specimens only), is one component  of a comprehensive MRSA colonization surveillance program. It is not intended to diagnose MRSA infection nor to guide or monitor treatment for MRSA infections.      Studies:  Recent x-ray studies have been reviewed in detail by the Attending Physician  Scheduled Meds:  Scheduled Meds: . aspirin  81 mg Oral BID  . atorvastatin  40 mg Oral QPM  . carvedilol  3.125 mg Oral BID WC  . citalopram  40 mg Oral QPM  . feeding supplement (ENSURE ENLIVE)  237 mL Oral BID BM  . furosemide  40 mg Oral Daily  . heparin  5,000 Units Subcutaneous 3 times per day  . insulin aspart  0-15 Units Subcutaneous 6 times per day  . pantoprazole  40 mg Oral Daily  . ramipril  5 mg Oral Daily  . sodium chloride flush  3 mL Intravenous Q12H    Time spent on care of this patient: 40 mins   Flemon Kelty, Geraldo Docker , MD  [facility] Office  [phone] Pager - [phone]  On-Call/Text Page:      [email or web address]      password [email or web address]  If 7PM-7AM, please contact night-coverage [email or web address] Password [email or web address] 07/31/2015, 7:12 PM   LOS: 1 day   Care during the described time interval was provided by me .  I have reviewed this patient's available data, including medical history, events of note, physical examination, and all test results as part of my evaluation. I have personally reviewed and interpreted all radiology studies.   Dia Crawford, MD [phone] Pager

## 2015-08-01 LAB — PROCALCITONIN: Procalcitonin: <0.1 ng/mL

## 2015-08-01 LAB — LIPID PANEL
Cholesterol: 149 mg/dL (ref 0–200)
HDL: 23 mg/dL — ABNORMAL LOW (ref >40)
LDL CALC: 99 mg/dL (ref 0–99)
TRIGLYCERIDES: 135 mg/dL (ref <150)
Total CHOL/HDL Ratio: 6.5 RATIO
VLDL: 27 mg/dL (ref 0–40)

## 2015-08-01 LAB — CBC WITH DIFFERENTIAL/PLATELET
Basophils Absolute: 0 10*3/uL (ref 0.0–0.1)
Basophils Relative: 0 %
EOS ABS: 0.2 10*3/uL (ref 0.0–0.7)
EOS PCT: 1 %
HCT: 41.9 % (ref 39.0–52.0)
Hemoglobin: 13.8 g/dL (ref 13.0–17.0)
LYMPHS ABS: 1.4 10*3/uL (ref 0.7–4.0)
Lymphocytes Relative: 12 %
MCH: 30.1 pg (ref 26.0–34.0)
MCHC: 32.9 g/dL (ref 30.0–36.0)
MCV: 91.5 fL (ref 78.0–100.0)
MONO ABS: 1 10*3/uL (ref 0.1–1.0)
Monocytes Relative: 9 %
Neutro Abs: 9.2 10*3/uL — ABNORMAL HIGH (ref 1.7–7.7)
Neutrophils Relative %: 78 %
PLATELETS: 264 10*3/uL (ref 150–400)
RBC: 4.58 MIL/uL (ref 4.22–5.81)
RDW: 14.5 % (ref 11.5–15.5)
WBC: 11.8 10*3/uL — AB (ref 4.0–10.5)

## 2015-08-01 LAB — COMPREHENSIVE METABOLIC PANEL
ALT: 16 U/L — AB (ref 17–63)
AST: 44 U/L — AB (ref 15–41)
Albumin: 2.9 g/dL — ABNORMAL LOW (ref 3.5–5.0)
Alkaline Phosphatase: 114 U/L (ref 38–126)
Anion gap: 14 (ref 5–15)
BILIRUBIN TOTAL: 1.4 mg/dL — AB (ref 0.3–1.2)
BUN: 9 mg/dL (ref 6–20)
CHLORIDE: 94 mmol/L — AB (ref 101–111)
CO2: 24 mmol/L (ref 22–32)
CREATININE: 0.55 mg/dL — AB (ref 0.61–1.24)
Calcium: 9.2 mg/dL (ref 8.9–10.3)
GFR calc Af Amer: >60 mL/min (ref >60)
GLUCOSE: 163 mg/dL — AB (ref 65–99)
Potassium: 4.6 mmol/L (ref 3.5–5.1)
Sodium: 132 mmol/L — ABNORMAL LOW (ref 135–145)
TOTAL PROTEIN: 6.8 g/dL (ref 6.5–8.1)

## 2015-08-01 LAB — GLUCOSE, CAPILLARY
GLUCOSE-CAPILLARY: 164 mg/dL — AB (ref 65–99)
GLUCOSE-CAPILLARY: 189 mg/dL — AB (ref 65–99)
GLUCOSE-CAPILLARY: 166 mg/dL — AB (ref 65–99)
Glucose-Capillary: 137 mg/dL — ABNORMAL HIGH (ref 65–99)
Glucose-Capillary: 349 mg/dL — ABNORMAL HIGH (ref 65–99)

## 2015-08-01 LAB — MAGNESIUM: MAGNESIUM: 1.6 mg/dL — AB (ref 1.7–2.4)

## 2015-08-01 MED ORDER — MAGNESIUM SULFATE 2 GM/50ML IV SOLN
2.0000 g | Freq: Once | INTRAVENOUS | Status: DC
  Filled 2015-08-01 (×2): qty 50

## 2015-08-01 MED ORDER — IPRATROPIUM-ALBUTEROL 0.5-2.5 (3) MG/3ML IN SOLN
3.0000 mL | Freq: Four times a day (QID) | RESPIRATORY_TRACT | Status: DC
  Administered 2015-08-01: 3 mL via RESPIRATORY_TRACT
  Filled 2015-08-01: qty 3

## 2015-08-01 MED ORDER — ALBUTEROL SULFATE (2.5 MG/3ML) 0.083% IN NEBU: 2.5000 mg | INHALATION_SOLUTION | RESPIRATORY_TRACT | Status: DC | PRN

## 2015-08-01 MED ORDER — SODIUM CHLORIDE 0.9 % IV BOLUS (SEPSIS)
250.0000 mL | Freq: Once | INTRAVENOUS | Status: AC
  Administered 2015-08-01: 250 mL via INTRAVENOUS

## 2015-08-01 MED ORDER — IPRATROPIUM-ALBUTEROL 0.5-2.5 (3) MG/3ML IN SOLN
3.0000 mL | Freq: Three times a day (TID) | RESPIRATORY_TRACT | Status: DC
  Administered 2015-08-01 – 2015-08-06 (×11): 3 mL via RESPIRATORY_TRACT
  Filled 2015-08-01 (×10): qty 3

## 2015-08-01 MED ORDER — INSULIN GLARGINE 100 UNIT/ML ~~LOC~~ SOLN
8.0000 [IU] | Freq: Every day | SUBCUTANEOUS | Status: DC
Start: 2015-08-01 — End: 2015-08-07
  Administered 2015-08-01 – 2015-08-06 (×6): 8 [IU] via SUBCUTANEOUS
  Filled 2015-08-01 (×7): qty 0.08

## 2015-08-01 MED ORDER — INSULIN ASPART 100 UNIT/ML ~~LOC~~ SOLN
0.0000 [IU] | Freq: Three times a day (TID) | SUBCUTANEOUS | Status: DC
  Administered 2015-08-02: 5 [IU] via SUBCUTANEOUS
  Administered 2015-08-02: 8 [IU] via SUBCUTANEOUS
  Administered 2015-08-03: 2 [IU] via SUBCUTANEOUS
  Administered 2015-08-03: 5 [IU] via SUBCUTANEOUS
  Administered 2015-08-03: 3 [IU] via SUBCUTANEOUS
  Administered 2015-08-04: 5 [IU] via SUBCUTANEOUS
  Administered 2015-08-04: 2 [IU] via SUBCUTANEOUS
  Administered 2015-08-04: 5 [IU] via SUBCUTANEOUS
  Administered 2015-08-05: 3 [IU] via SUBCUTANEOUS
  Administered 2015-08-06 (×2): 5 [IU] via SUBCUTANEOUS
  Administered 2015-08-07: 3 [IU] via SUBCUTANEOUS
  Administered 2015-08-07: 2 [IU] via SUBCUTANEOUS
  Administered 2015-08-07: 5 [IU] via SUBCUTANEOUS
  Administered 2015-08-08: 2 [IU] via SUBCUTANEOUS

## 2015-08-01 MED ORDER — ARFORMOTEROL TARTRATE 15 MCG/2ML IN NEBU
15.0000 ug | INHALATION_SOLUTION | Freq: Two times a day (BID) | RESPIRATORY_TRACT | Status: DC
Start: 2015-08-01 — End: 2015-08-08
  Administered 2015-08-02 – 2015-08-08 (×11): 15 ug via RESPIRATORY_TRACT
  Filled 2015-08-01 (×16): qty 2

## 2015-08-01 MED ORDER — ASPIRIN 81 MG PO CHEW
81.0000 mg | CHEWABLE_TABLET | Freq: Every day | ORAL | Status: DC
  Administered 2015-08-02 – 2015-08-08 (×6): 81 mg via ORAL
  Filled 2015-08-01 (×6): qty 1

## 2015-08-01 MED ORDER — BUDESONIDE 0.5 MG/2ML IN SUSP
0.5000 mg | Freq: Two times a day (BID) | RESPIRATORY_TRACT | Status: DC
  Administered 2015-08-01 – 2015-08-08 (×14): 0.5 mg via RESPIRATORY_TRACT
  Filled 2015-08-01 (×15): qty 2

## 2015-08-01 MED ORDER — ENOXAPARIN SODIUM 40 MG/0.4ML ~~LOC~~ SOLN
40.0000 mg | SUBCUTANEOUS | Status: DC
Start: 2015-08-01 — End: 2015-08-05
  Administered 2015-08-01 – 2015-08-04 (×4): 40 mg via SUBCUTANEOUS
  Filled 2015-08-01 (×4): qty 0.4

## 2015-08-01 NOTE — Progress Notes (Signed)
Occupational Therapy Evaluation Patient Details Name: Cody [name] MRN: [id number] DOB: 12/02/1965 Today's Date: 08/01/2015    History of Present Illness 50yo M HxTBI, ADD, Anxiety, Depression, HTN, CAD w/ MI, DM2, HLD, and OSA on CPAP who presented to [facility] with increasing shortness of breath, cough, and congestion. 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 CT abdomen pelvis noted mets in liver, adrenal glands, and mesentery, as well as L femur subthrochanteric mets to the medullary cavity    Clinical Impression   PTA, pt independent with ADL and mobility. On disability. Wife does not work and can provide 24/7 S at D/C. Pt ambulated > 200 ft on RA with O2 Sats 93-91 and 1/4 dypsnea. Pt reports feeling better but still lack of energy. Will follow up with pt next week to educate on energy conservation. Pt interested in any DME he will need after his "lung procedure". Discussed possible use of 3 in 1 as shower chair. Encouraged pt OOB daily and ambulate with nsg. Pt very pleasant and appreciative.     Follow Up Recommendations  No OT follow up;Supervision - Intermittent    Equipment Recommendations  3 in 1 bedside comode (pending progress)    Recommendations for Other Services       Precautions / Restrictions Precautions Precautions: Other (comment) (watch O2 Sats)      Mobility Bed Mobility Overal bed mobility: Modified Independent                Transfers Overall transfer level: Independent                    Balance Overall balance assessment: No apparent balance deficits (not formally assessed)                                          ADL Overall ADL's : At baseline                                       General ADL Comments: Fatigues with ADL. Gets SOB with activity     Vision     Perception     Praxis      Pertinent Vitals/Pain Pain Assessment:  Faces Faces Pain Scale: Hurts a little bit Pain Location: BLE Pain Descriptors / Indicators: Discomfort Pain Intervention(s): Limited activity within patient's tolerance     Hand Dominance Right   Extremity/Trunk Assessment Upper Extremity Assessment Upper Extremity Assessment: Overall WFL for tasks assessed   Lower Extremity Assessment Lower Extremity Assessment: Overall WFL for tasks assessed   Cervical / Trunk Assessment Cervical / Trunk Assessment: Normal   Communication Communication Communication: No difficulties   Cognition Arousal/Alertness: Awake/alert Behavior During Therapy: Flat affect Overall Cognitive Status: Within Functional Limits for tasks assessed                     General Comments       Exercises       Shoulder Instructions      Home Living Family/patient expects to be discharged to:: Private residence Living Arrangements: Spouse/significant other Available Help at Discharge: Family;Available 24 hours/day Type of Home: House Home Access: Stairs to enter [employer] of Steps: 5 Entrance Stairs-Rails: Right;Left;Can reach both Home Layout: One level  Bathroom Shower/Tub: Risk analyst characteristics: Scientist, water quality: None          Prior Functioning/Environment Level of Independence: Independent        Comments: on disability    OT Diagnosis: Generalized weakness;Acute pain   OT Problem List: Decreased activity tolerance;Decreased knowledge of use of DME or AE;Cardiopulmonary status limiting activity;Obesity;Pain   OT Treatment/Interventions: Self-care/ADL training;Energy conservation;DME and/or AE instruction;Therapeutic activities;Patient/family education    OT Goals(Current goals can be found in the care plan section) Acute Rehab OT Goals Patient Stated Goal: to get better OT Goal Formulation: With patient Time For Goal Achievement:  08/15/15 Potential to Achieve Goals: Good ADL Goals Additional ADL Goal #1: Pt will verbalize understadning of 3 energy conservation techniques for ADL  Additional ADL Goal #2: Further assess use of 3 in1 for bathing/tub transfer  OT Frequency: Min 2X/week   Barriers to D/C:            Co-evaluation              End of Session Equipment Utilized During Treatment: Gait belt;Oxygen Nurse Communication: Mobility status  Activity Tolerance: Patient tolerated treatment well Patient left: in bed;with call bell/phone within reach   Time: 1817-1835 OT Time Calculation (min): 18 min Charges:  OT General Charges $OT Visit: 1 Procedure OT Evaluation $OT Eval Moderate Complexity: 1 Procedure G-Codes:    Braylin Xu,HILLARY 12-Aug-2015, 6:46 PM   [facility], OTR/L  [phone] 2015/08/12

## 2015-08-01 NOTE — Progress Notes (Signed)
[name] - Stepdown/ICU TEAM PROGRESS NOTE  Cody [name]:[id number] DOB: Dec 14, 1965 DOA: 07/30/2015 PCP: Chesley Noon, MD  Admit HPI / Brief Narrative: 50yo M HxTBI, ADD, Anxiety, Depression, HTN, CAD w/ MI, DM2, HLD, and OSA on CPAP who presented to [facility] with increasing shortness of breath, cough, and congestion. He had been diagnosed with pneumonia 3 weeks prior, but the symptoms were not relieved with antibiotics as an outpatient. CT at [facility] revealed a large left-sided pleural effusion, and mediastinal adenopathy suspicious for an endobronchial/hilar mass with lymph node metastases, as well as a right-sided pulmonary nodule (3 mm). No PE was noted.    HPI/Subjective: The patient is sitting up at the bedside with no complaints.  He states his shortness of breath is much improved.  He denies chest pain or abdominal pain.  He denies nausea or vomiting.  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 - Lung masses / Large L pleural effusion  -CT chest 2/16 w/ collapse and consolidation of L lung w/ narrowing of L mainstem bronchus due to lymph node mass, and probable metastatic lymph nodes in mediastinum -CT head w/o evidence of brain mets  -CT abdomen pelvis noted mets in liver, adrenal glands, and mesentery, as well as L femur subthrochanteric mets to the medullary cavity  -2/15 S/P left thoracentesis 2 L- cytology not helpful -for EBUS next Tuesday   Acute respiratory failure with hypoxia secondary to large left pleural effusion Resolved with thoracentesis - now sats 96+% on 2L [place] only   DM2  A1c pending - CBG modestly elevated - follow w/o change today   Hx of HTN  BP modestly low today - decrease BP meds and follow   Chronic Grade 2 Diastolic CHF hold lasix in setting of hypotension - no clinical evidence of signif volume overload at this time   Hyperkalemia Resolved  Hypomagnesemia Replace and follow    Sleep  Apnea  Pt refusing CPAP use    Severe malnutrition in context of acute illness/injury  Obesity - Body mass index is 32.32 kg/(m^2).  Code Status: FULL Family Communication: no family present at time of exam Disposition Plan: SDU   Consultants: PCCM  Procedures: 2/14 CT Angiogram at [facility] - Occlusion of the left mainstem bronchus. Mass effect on the left mainstem bronchus with associated narrowing from the adjacent mass lesion. -Mediastinum/hila: 2.8 x 4.7 cm lymph node conglomerate subcarinal location. Several mildly enlarged Paraesophageal lymph nodes also identified. -Mild left to right mediastinal shift. 1.2 cm AP window LN. -Heart; Minimal pericardial effusion. -Complete collapseleft lung. 3 mm nodule right upper lobe close associated with the right minor fissure. 4 x 3 mm RML pulmonary nodule image -3 mm LLL nodule . 2/15 left thoracentesis - 2 L amber fluid removed 2/16 CT chest with contrast - Moderate left pleural effusion with collapse and consolidation of the left lung. -narrowing of the left mainstem bronchus likely indicating an obstructing mass lesion although the mass is obscured to visualization by the surrounding postobstructive changes and effusion.  -Mild atelectasis in the right lung base.- Probable metastatic mediastinal lymph nodes, bilateral adrenal gland nodules,and abdominal peritoneal deposits. 2/16 echocardiogram - EF 55-60% - grade 2 diastolic dysfunction - Pericardium, moderate-sized leftpleural effusion.  Antibiotics: none  DVT prophylaxis: lovenox   Objective: Blood pressure 82/45, pulse 81, temperature 98.1 F (36.7 C), temperature source Oral, resp. rate 19, height '5\' 8"'$  (1.727 m), weight 96.4 kg (212 lb 8.4 oz), SpO2 96 %.  Intake/Output  Summary (Last 24 hours) at 08/01/15 1656 Last data filed at 08/01/15 0900  Gross per 24 hour  Intake    800 ml  Output    900 ml  Net   -100 ml   Exam: General: No acute respiratory distress Lungs:  poor to minimal air movement th/o L lung fields - no wheeze  Cardiovascular: Regular rate and rhythm without murmur gallop or rub  Abdomen: Nontender, nondistended, soft, bowel sounds positive, no rebound, no ascites, no appreciable mass Extremities: No significant cyanosis, clubbing, or edema bilateral lower extremities  Data Reviewed:  Basic Metabolic Panel:  Recent Labs Lab 07/30/15 1010 07/31/15 0520 08/01/15 0550  NA 137 137 132*  K 5.4* 4.2 4.6  CL 100* 98* 94*  CO2 '25 27 24  '$ GLUCOSE 65 85 163*  BUN 5* 8 9  CREATININE 0.73 0.68 0.55*  CALCIUM 9.3 9.0 9.2  MG  --   --  1.6*    CBC:  Recent Labs Lab 07/30/15 1153 07/31/15 0520 08/01/15 0550  WBC 9.6 10.5 11.8*  NEUTROABS 6.8  --  9.2*  HGB 12.7* 13.9 13.8  HCT 39.2 42.7 41.9  MCV 92.2 92.0 91.5  PLT 246 270 264    Liver Function Tests:  Recent Labs Lab 08/01/15 0550  AST 44*  ALT 16*  ALKPHOS 114  BILITOT 1.4*  PROT 6.8  ALBUMIN 2.9*   Coags:  Recent Labs Lab 07/31/15 0520  INR 1.23   CBG:  Recent Labs Lab 07/31/15 1733 07/31/15 2133 08/01/15 0333 08/01/15 0758 08/01/15 1320  GLUCAP 281* 176* 137* 164* 189*    Recent Results (from the past 240 hour(s))  Culture, body fluid-bottle     Status: None (Preliminary result)   Collection Time: 07/30/15  2:23 PM  Result Value Ref Range Status   Specimen Description FLUID PLEURAL LEFT  Final   Special Requests BOTTLES DRAWN AEROBIC AND ANAEROBIC 10CC  Final   Culture NO GROWTH 2 DAYS  Final   Report Status PENDING  Incomplete  Gram stain     Status: None   Collection Time: 07/30/15  2:23 PM  Result Value Ref Range Status   Specimen Description FLUID PLEURAL LEFT  Final   Special Requests NONE  Final   Gram Stain   Final    CYTOSPIN SMEAR WBC PRESENT,BOTH PMN AND MONONUCLEAR NO ORGANISMS SEEN    Report Status 07/30/2015 FINAL  Final  MRSA PCR Screening     Status: None   Collection Time: 07/30/15  7:18 PM  Result Value Ref Range  Status   MRSA by PCR NEGATIVE NEGATIVE Final    Comment:        The GeneXpert MRSA Assay (FDA approved for NASAL specimens only), is one component of a comprehensive MRSA colonization surveillance program. It is not intended to diagnose MRSA infection nor to guide or monitor treatment for MRSA infections.      Studies:   Recent x-ray studies have been reviewed in detail by the Attending Physician  Scheduled Meds:  Scheduled Meds: . arformoterol  15 mcg Nebulization BID  . aspirin  81 mg Oral BID  . atorvastatin  40 mg Oral QPM  . budesonide (PULMICORT) nebulizer solution  0.5 mg Nebulization BID  . carvedilol  3.125 mg Oral BID WC  . citalopram  40 mg Oral QPM  . feeding supplement (ENSURE ENLIVE)  237 mL Oral BID BM  . furosemide  40 mg Oral Daily  . heparin  5,000 Units Subcutaneous  3 times per day  . insulin aspart  0-15 Units Subcutaneous 6 times per day  . ipratropium-albuterol  3 mL Nebulization TID  . pantoprazole  40 mg Oral Daily  . ramipril  5 mg Oral Daily  . sodium chloride flush  3 mL Intravenous Q12H    Time spent on care of this patient: 35 mins   MCCLUNG,JEFFREY T , MD   [facility] Office  [phone] Pager - Text Page per Shea Evans as per below:  On-Call/Text Page:      [email or web address]      password [email or web address]  If 7PM-7AM, please contact night-coverage [email or web address] Password [email or web address] 08/01/2015, 4:56 PM   LOS: 2 days

## 2015-08-01 NOTE — Care Management Important Message (Signed)
Important Message  Patient Details  Name: Cody [name] MRN: [id number] Date of Birth: October 05, 1965   Medicare Important Message Given:  Yes    Nathen May 08/01/2015, 11:51 AM

## 2015-08-01 NOTE — Progress Notes (Addendum)
Name: Cody [name] MRN: [id number] DOB: 1965-10-19    ADMISSION DATE:  07/30/2015 CONSULTATION DATE:  07/31/2015  REFERRING MD:  Sherral Hammers  CHIEF COMPLAINT:  SOB  SUBJECTIVE: He reports dyspnea but improved. No fevers, chills, chest pain, nausea, vomiting.  VITAL SIGNS: Temp:  [97.8 F (36.6 C)-98.8 F (37.1 C)] 97.8 F (36.6 C) (02/17 0333) Pulse Rate:  [79-87] 79 (02/17 0333) Resp:  [18-29] 21 (02/17 0333) BP: (101-126)/(68-80) 126/70 mmHg (02/17 0333) SpO2:  [95 %-100 %] 97 % (02/17 0526) Weight:  [212 lb 8.4 oz (96.4 kg)] 212 lb 8.4 oz (96.4 kg) (02/17 0333)  PHYSICAL EXAMINATION: General:  NAD Neuro:  No gross deficits HEENT:  [place]/AT, PERRL, MMM Cardiovascular:  RRR Lungs:  Decreased BS on L throughout. R with wheezes Abdomen:  Soft, obese Musculoskeletal:  No edema Skin:  Warm and well perfused   Recent Labs Lab 07/30/15 1010 07/31/15 0520 08/01/15 0550  NA 137 137 132*  K 5.4* 4.2 4.6  CL 100* 98* 94*  CO2 '25 27 24  '$ BUN 5* 8 9  CREATININE 0.73 0.68 0.55*  GLUCOSE 65 85 163*    Recent Labs Lab 07/30/15 1153 07/31/15 0520 08/01/15 0550  HGB 12.7* 13.9 13.8  HCT 39.2 42.7 41.9  WBC 9.6 10.5 11.8*  PLT 246 270 264   Dg Chest 1 View  07/30/2015  CLINICAL DATA:  Post left thoracentesis EXAM: CHEST  1 VIEW COMPARISON:  Earlier film of the same day FINDINGS: No pneumothorax. Persistent opacification of the left hemi thorax. Can't exclude left pleural residual left pleural effusion. Narrowing of the distal left mainstem bronchus. Right lung clear. Heart size difficult to assess due to adjacent opacity. Visualized skeletal structures are unremarkable. IMPRESSION: 1. No pneumothorax post thoracentesis. 2. Persistent opacification of the left hemi thorax with left mainstem bronchus narrowing suggesting central obstructing lesion. Consider CT chest with contrast for further assessment. Electronically Signed   By: Lucrezia Europe M.D.   On: 07/30/2015 14:52   Ct Head W Wo  Contrast  07/31/2015  CLINICAL DATA:  Follow-up metastatic cancer. EXAM: CT HEAD WITHOUT AND WITH CONTRAST TECHNIQUE: Contiguous axial images were obtained from the base of the skull through the vertex without and with intravenous contrast CONTRAST:  50 cc Omnipaque 300 COMPARISON:  MRI of the brain October 08, 2013 FINDINGS: RIGHT frontotemporal encephalomalacia. No intraparenchymal hemorrhage, mass effect, midline shift or acute large vascular territory infarcts. No abnormal insular parenchymal enhancement. No abnormal extra-axial fluid collections. 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 No abnormal extra-axial enhancement. Ocular globes and orbital contents are normal. Included paranasal sinuses and mastoid air cells are well aerated. No destructive bony lesions in the calvarium, no skull fracture. IMPRESSION: No CT findings of intracranial metastasis though, MRI with contrast is more sensitive. 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 Electronically Signed   By: Elon Alas M.D.   On: 07/31/2015 21:37   Ct Chest W Contrast  07/31/2015  CLINICAL DATA:  Pleural effusion.  Left lung mass. EXAM: CT CHEST WITH CONTRAST TECHNIQUE: Multidetector CT imaging of the chest was performed during intravenous contrast administration. CONTRAST:  41m OMNIPAQUE IOHEXOL 300 MG/ML  SOLN I was called to the evaluate the patient at 0506 hours on 07/31/2015 for complaint of contrast extravasation. Estimated volume of extravasation was about 25-30 mL. On examination, the patient was alert and oriented and in no distress. Patient was sitting in the wheelchair with ice pack over the right antecubital  fossa. IV cannula was in place in the right antecubital fossa with small amount of bruising which, by history, was present prior to the extravasation. There was approximately 5-6 cm area of swelling in the antecubital fossa anteriorly. The patient states this area was sore to  palpation. No redness, blistering, or skin breakdown. Area of swelling was soft to palpation. Distal pulses and capillary refill were normal. Grip strength was normal. Patient was advised place ice packs over the area 3 times a day for the next 3 days or until resolution. Elevation of the extremity was recommended. Patient was instructed to seek medical attention if there is any change in sensation, increased pain, increased swelling, skin breakdown, blistering, or change in skin color. Contrast extravasation protocol orders were entered by the technologist. COMPARISON:  Chest radiograph 07/30/2015 FINDINGS: Normal heart size. Prominent Coronary artery calcifications. Cardiac stents are likely present. Normal caliber thoracic aorta. Lymphadenopathy in the chest with subcarinal lymph nodes measuring about 2.5 cm short axis dimension. Right superior paratracheal nodes measuring about 11 mm diameter. Esophagus is decompressed. There is a moderate left pleural effusion with collapse and consolidation of much of the left lung. Minimal residual aeration is present. The left mainstem bronchus is narrowed with probable left perihilar mass lesion. Presumed mass is obscured by the surrounding postobstructive change and effusion. Mild atelectasis in the right lung base. No pneumothorax. Included portions of the upper abdominal organs demonstrate bilateral adrenal gland nodules, measuring 2.8 cm on the right and 4.2 cm on the left. These are likely metastatic. Surgical absence of the gallbladder. Scattered soft tissue nodules in the subcutaneous fat around the liver and in the omentum may represent metastatic lymph nodes or peritoneal deposits. Degenerative changes throughout the thoracic spine. No destructive bone lesions are appreciated. Nonobstructing stones in the right kidney. IMPRESSION: Moderate left pleural effusion with collapse and consolidation of the left lung. Narrowing of the left mainstem bronchus likely  indicating an obstructing mass lesion although the mass is obscured to visualization by the surrounding postobstructive changes and effusion. Mild atelectasis in the right lung base. Probable metastatic mediastinal lymph nodes, bilateral adrenal gland nodules, and abdominal peritoneal deposits. Note that the examination was complicated by contrast extravasation of about 25-30 cc into the right antecubital fossa. Electronically Signed   By: Lucienne Capers M.D.   On: 07/31/2015 05:55   Ct Abdomen Pelvis W Contrast  07/31/2015  CLINICAL DATA:  Metastatic cancer follow-up. EXAM: CT ABDOMEN AND PELVIS WITH CONTRAST TECHNIQUE: Multidetector CT imaging of the abdomen and pelvis was performed using the standard protocol following bolus administration of intravenous contrast. CONTRAST:  160m OMNIPAQUE IOHEXOL 300 MG/ML  SOLN COMPARISON:  11/30/2010 FINDINGS: Lower chest and abdominal wall: Known large left pleural effusion with mass and collapse in the left lower lobe and lingula. Necrotic subcarinal lymphadenopathy. Extensive coronary atherosclerotic calcification with a remote septal subendocardial infarct. Hepatobiliary: There are at least 6 low-density masses in the liver compatible with metastases. These measure 14 mm or smaller and are marked on the image for follow-up purposes.Cholecystectomy with negative common bile duct. Pancreas: Unremarkable. Spleen: Unremarkable. Adrenals/Urinary Tract: Centrally low dense bilateral adrenal masses, larger on the left- measuring up to 51 mm. Symmetric renal enhancement. No hydronephrosis Unremarkable bladder. Reproductive:No pathologic findings. Stomach/Bowel:  No obstruction Vascular/Lymphatic: There are numerous mesenteric or less likely peritoneal nodules, the largest along the right colon 4:49 measuring 21 x 28 mm. No ascites. Extensive atherosclerosis. Peritoneal: No ascites or pneumoperitoneum. Musculoskeletal: Focal marrow lesion in the  subtrochanteric left medullary  cavity. No evidence of cortical involvement or fracture. 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 There is mediastinal adenopathy and malignant appearing left pleural effusion. Intra-abdominal metastases seen in the liver, adrenal glands, and mesentery. 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 Electronically Signed   By: Monte Fantasia M.D.   On: 07/31/2015 21:54   Dg Chest Portable 1 View  07/30/2015  CLINICAL DATA:  Diminished lung sounds on the left. Shortness of breath and pneumonia. EXAM: PORTABLE CHEST 1 VIEW COMPARISON:  12/06/2012 FINDINGS: Right chest is normal. Left hemi thorax is completely opacified consistent with a combination of pulmonary infiltrate, collapse and pleural fluid. Mediastinum is not shifted. No bony abnormality seen. IMPRESSION: Complete opacification of the left hemi thorax without mediastinal shift. This is likely due to a combination of pneumonia, atelectasis and pleural fluid. Electronically Signed   By: Nelson Chimes M.D.   On: 07/30/2015 11:13   US Thoracentesis Asp Pleural Space W/img Guide  07/30/2015  INDICATION: Shortness of breath. Recent pneumonia. Large left pleural effusion. Request for diagnostic and therapeutic thoracentesis. EXAM: ULTRASOUND GUIDED LEFT THORACENTESIS MEDICATIONS: 1% Lidocaine. COMPLICATIONS: None immediate. PROCEDURE: An ultrasound guided thoracentesis was thoroughly discussed with the patient and questions answered. The benefits, risks, alternatives and complications were also discussed. The patient understands and wishes to proceed with the procedure. Written consent was obtained. Ultrasound was performed to localize and Noris an adequate pocket of fluid in the left chest. The area was then prepped and draped in the normal sterile fashion. 1% Lidocaine was used for local anesthesia. Under ultrasound guidance a 6 Fr Safe-T-Centesis catheter was introduced. Thoracentesis was performed.  The catheter was removed and a dressing applied. FINDINGS: A total of approximately 2 liters of amber fluid was removed. Samples were sent to the laboratory as requested by the clinical team. IMPRESSION: Successful ultrasound guided left thoracentesis yielding 2 liters of pleural fluid. Read by:  Gareth Eagle, PA-C Electronically Signed   By: Marybelle Killings M.D.   On: 07/30/2015 14:55    ASSESSMENT / PLAN: 50 year old male smoker (30-pack-year) with history of CAD s/p PCI in 2000 and 2010, CHF, GERD, DM, HTN, asthma, OSA, depression/anxiety presenting with dyspnea to [facility] found to have large left sided pleural effusion and mediastinal adenopathy on CT chest. Seen by IR on 2/15 for left sided thoracentesis by IR -- 2L amber colored fluid removed.  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 Narrowing of left mainstem bronchus. CT head with no intracranial metastasis. CT abd/pelvis with intraabdominal metastases seen in liver, adrenal glands, and mesentery. 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 Fluid analysis exudative but culture NGTD and procalcitonin <0.1 -Awaiting cytology. Pathology performing further stains on the specimen. Results likely by this afternoon.  OSA: Patient refusing to use CPAP. Will consider it if his breathing worsens.  Diastolic CHF: Echo with LV EF 55% to 60% and grade 2 diastolic dysfunction.  Jacques Earthly, MD  Internal Medicine PGY-2  Pulmonary and [place] Pager: [phone]  08/01/2015, 8:23 AM   Addendum: 08/01/15 4pm I spoke with Pathologist and he said pleural fluid was (-) for malignant cells. Only had atypical cells. Relayed to nurse who will mention to pt. Pt will need Bronch with EBUS on 2/21 Tues 730am in OR Rm 10. Dr. Lamonte Sakai will do the procedure. Pt NPO at 1201  am on  Tuesday. Nurse notified.   -- Charolette Forward. [facility] Pulm/Crit Care [phone]

## 2015-08-02 DIAGNOSIS — E119 Type 2 diabetes mellitus without complications: Secondary | ICD-10-CM

## 2015-08-02 LAB — CBC
HCT: 39.4 % (ref 39.0–52.0)
Hemoglobin: 12.9 g/dL — ABNORMAL LOW (ref 13.0–17.0)
MCH: 29.9 pg (ref 26.0–34.0)
MCHC: 32.7 g/dL (ref 30.0–36.0)
MCV: 91.4 fL (ref 78.0–100.0)
PLATELETS: 250 10*3/uL (ref 150–400)
RBC: 4.31 MIL/uL (ref 4.22–5.81)
RDW: 14.4 % (ref 11.5–15.5)
WBC: 9.6 10*3/uL (ref 4.0–10.5)

## 2015-08-02 LAB — GLUCOSE, CAPILLARY
GLUCOSE-CAPILLARY: 91 mg/dL (ref 65–99)
GLUCOSE-CAPILLARY: 153 mg/dL — AB (ref 65–99)
Glucose-Capillary: 216 mg/dL — ABNORMAL HIGH (ref 65–99)
Glucose-Capillary: 297 mg/dL — ABNORMAL HIGH (ref 65–99)

## 2015-08-02 LAB — COMPREHENSIVE METABOLIC PANEL
ALK PHOS: 150 U/L — AB (ref 38–126)
ALT: 18 U/L (ref 17–63)
ANION GAP: 12 (ref 5–15)
AST: 45 U/L — ABNORMAL HIGH (ref 15–41)
Albumin: 2.4 g/dL — ABNORMAL LOW (ref 3.5–5.0)
BILIRUBIN TOTAL: 0.6 mg/dL (ref 0.3–1.2)
BUN: 12 mg/dL (ref 6–20)
CALCIUM: 8.7 mg/dL — AB (ref 8.9–10.3)
CO2: 27 mmol/L (ref 22–32)
CREATININE: 0.76 mg/dL (ref 0.61–1.24)
Chloride: 96 mmol/L — ABNORMAL LOW (ref 101–111)
GFR calc non Af Amer: >60 mL/min (ref >60)
Glucose, Bld: 128 mg/dL — ABNORMAL HIGH (ref 65–99)
Potassium: 4.8 mmol/L (ref 3.5–5.1)
SODIUM: 135 mmol/L (ref 135–145)
TOTAL PROTEIN: 5.6 g/dL — AB (ref 6.5–8.1)

## 2015-08-02 LAB — HEMOGLOBIN A1C
Hgb A1c MFr Bld: 6.4 % — ABNORMAL HIGH (ref 4.8–5.6)
MEAN PLASMA GLUCOSE: 137 mg/dL

## 2015-08-02 LAB — MAGNESIUM: MAGNESIUM: 1.7 mg/dL (ref 1.7–2.4)

## 2015-08-02 NOTE — Progress Notes (Signed)
[place] TEAM 1 - Stepdown/ICU TEAM PROGRESS NOTE  Cody [name] MIW:[id number] DOB: Aug 15, 1965 DOA: 07/30/2015 PCP: Chesley Noon, MD  Admit HPI / Brief Narrative: 50yo M HxTBI, ADD, Anxiety, Depression, HTN, CAD w/ MI, DM2, HLD, and OSA on CPAP who presented to [facility] with increasing shortness of breath, cough, and congestion. He had been diagnosed with pneumonia 3 weeks prior, but the symptoms were not relieved with antibiotics as an outpatient. CT at [facility] revealed a large left-sided pleural effusion, and mediastinal adenopathy suspicious for an endobronchial/hilar mass with lymph node metastases, as well as a right-sided pulmonary nodule (3 mm). No PE was noted.    HPI/Subjective: The patient is sitting up on the side of the bed eating lunch.  He denies chest pain fevers chills nausea vomiting or abdominal pain.    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 - Lung masses / Large L pleural effusion  -CT chest 2/16 w/ collapse and consolidation of L lung w/ narrowing of L mainstem bronchus due to lymph node mass, and probable metastatic lymph nodes in mediastinum -CT head w/o evidence of brain mets  -CT abdomen pelvis noted mets in liver, adrenal glands, and mesentery, as well as L femur subthrochanteric mets to the medullary cavity  -2/15 S/P left thoracentesis 2 L- cytology not helpful -for EBUS next Tuesday   Acute respiratory failure with hypoxia secondary to large left pleural effusion Improved with thoracentesis - follow on continuous monitor for now as reaccumulation likely - still requiring 4L [place] support   DM2  A1c 6.4 - CBG modestly elevated - no change in tx plan for now   Hx of HTN  BP fluctuating between episodic hypotension and HTN - follow w/o change in tx today   Chronic Grade 2 Diastolic CHF hold lasix in setting of hypotension - no clinical evidence of signif volume overload at this time    Hyperkalemia Resolved  Hypomagnesemia Replace and follow    Sleep Apnea  Pt refusing CPAP use - do not feel that it is necessary to push this point currently   Severe malnutrition in context of acute illness/injury  Obesity - Body mass index is 32.71 kg/(m^2).  Code Status: FULL Family Communication: spoke with patient's wife at length at bedside Disposition Plan: SDU   Consultants: PCCM  Procedures: 2/14 CT Angiogram at [place] - Occlusion of the left mainstem bronchus. Mass effect on the left mainstem bronchus with associated narrowing from the adjacent mass lesion. -Mediastinum/hila: 2.8 x 4.7 cm lymph node conglomerate subcarinal location. Several mildly enlarged Paraesophageal lymph nodes also identified. -Mild left to right mediastinal shift. 1.2 cm AP window LN. -Heart; Minimal pericardial effusion. -Complete collapseleft lung. 3 mm nodule right upper lobe close associated with the right minor fissure. 4 x 3 mm RML pulmonary nodule image -3 mm LLL nodule . 2/15 left thoracentesis - 2 L amber fluid removed 2/16 CT chest with contrast - Moderate left pleural effusion with collapse and consolidation of the left lung. -narrowing of the left mainstem bronchus likely indicating an obstructing mass lesion although the mass is obscured to visualization by the surrounding postobstructive changes and effusion.  -Mild atelectasis in the right lung base.- Probable metastatic mediastinal lymph nodes, bilateral adrenal gland nodules,and abdominal peritoneal deposits. 2/16 echocardiogram - EF 55-60% - grade 2 diastolic dysfunction - Pericardium, moderate-sized leftpleural effusion.  Antibiotics: none  DVT prophylaxis: lovenox   Objective: Blood pressure 154/107, pulse 94, temperature 98.1 F (36.7 C), temperature source Oral,  resp. rate 16, height '5\' 8"'$  (1.727 m), weight 97.569 kg (215 lb 1.6 oz), SpO2 98 %.  Intake/Output Summary (Last 24 hours) at 08/02/15 1336 Last data  filed at 08/02/15 1000  Gross per 24 hour  Intake    480 ml  Output    800 ml  Net   -320 ml   Exam: General: No acute respiratory distress but requiring 4L [place] support  Lungs: poor to minimal air movement th/o L lung fields - no wheezing Cardiovascular: Regular rate and rhythm without murmur gallop or rub  Abdomen: Nontender, nondistended, soft, bowel sounds positive, no rebound, no appreciable mass Extremities: No significant cyanosis, clubbing, edema bilateral lower extremities  Data Reviewed:  Basic Metabolic Panel:  Recent Labs Lab 07/30/15 1010 07/31/15 0520 08/01/15 0550 08/02/15 0442  NA 137 137 132* 135  K 5.4* 4.2 4.6 4.8  CL 100* 98* 94* 96*  CO2 '25 27 24 27  '$ GLUCOSE 65 85 163* 128*  BUN 5* '8 9 12  '$ CREATININE 0.73 0.68 0.55* 0.76  CALCIUM 9.3 9.0 9.2 8.7*  MG  --   --  1.6* 1.7    CBC:  Recent Labs Lab 07/30/15 1153 07/31/15 0520 08/01/15 0550 08/02/15 0442  WBC 9.6 10.5 11.8* 9.6  NEUTROABS 6.8  --  9.2*  --   HGB 12.7* 13.9 13.8 12.9*  HCT 39.2 42.7 41.9 39.4  MCV 92.2 92.0 91.5 91.4  PLT 246 270 264 250    Liver Function Tests:  Recent Labs Lab 08/01/15 0550 08/02/15 0442  AST 44* 45*  ALT 16* 18  ALKPHOS 114 150*  BILITOT 1.4* 0.6  PROT 6.8 5.6*  ALBUMIN 2.9* 2.4*   Coags:  Recent Labs Lab 07/31/15 0520  INR 1.23   CBG:  Recent Labs Lab 08/01/15 1320 08/01/15 1801 08/01/15 2145 08/02/15 0751 08/02/15 1159  GLUCAP 189* 349* 166* 91 216*    Recent Results (from the past 240 hour(s))  Culture, body fluid-bottle     Status: None (Preliminary result)   Collection Time: 07/30/15  2:23 PM  Result Value Ref Range Status   Specimen Description FLUID PLEURAL LEFT  Final   Special Requests BOTTLES DRAWN AEROBIC AND ANAEROBIC 10CC  Final   Culture NO GROWTH 2 DAYS  Final   Report Status PENDING  Incomplete  Gram stain     Status: None   Collection Time: 07/30/15  2:23 PM  Result Value Ref Range Status   Specimen  Description FLUID PLEURAL LEFT  Final   Special Requests NONE  Final   Gram Stain   Final    CYTOSPIN SMEAR WBC PRESENT,BOTH PMN AND MONONUCLEAR NO ORGANISMS SEEN    Report Status 07/30/2015 FINAL  Final  MRSA PCR Screening     Status: None   Collection Time: 07/30/15  7:18 PM  Result Value Ref Range Status   MRSA by PCR NEGATIVE NEGATIVE Final    Comment:        The GeneXpert MRSA Assay (FDA approved for NASAL specimens only), is one component of a comprehensive MRSA colonization surveillance program. It is not intended to diagnose MRSA infection nor to guide or monitor treatment for MRSA infections.      Studies:   Recent x-ray studies have been reviewed in detail by the Attending Physician  Scheduled Meds:  Scheduled Meds: . arformoterol  15 mcg Nebulization BID  . aspirin  81 mg Oral Daily  . atorvastatin  40 mg Oral QPM  . budesonide (PULMICORT)  nebulizer solution  0.5 mg Nebulization BID  . carvedilol  3.125 mg Oral BID WC  . citalopram  40 mg Oral QPM  . enoxaparin (LOVENOX) injection  40 mg Subcutaneous Q24H  . feeding supplement (ENSURE ENLIVE)  237 mL Oral BID BM  . insulin aspart  0-15 Units Subcutaneous TID WC  . insulin glargine  8 Units Subcutaneous QHS  . ipratropium-albuterol  3 mL Nebulization TID  . magnesium sulfate 1 - 4 g bolus IVPB  2 g Intravenous Once  . pantoprazole  40 mg Oral Daily    Time spent on care of this patient: 35 mins   MCCLUNG,JEFFREY T , MD   [facility] Office  [phone] Pager - Text Page per Shea Evans as per below:  On-Call/Text Page:      [email or web address]      password [email or web address]  If 7PM-7AM, please contact night-coverage [email or web address] Password [email or web address] 08/02/2015, 1:36 PM   LOS: 3 days

## 2015-08-02 NOTE — Progress Notes (Signed)
Patient is refusing to wear CPAP at this time. RT informed patient if he changes his mind have RN contact RT.

## 2015-08-03 ENCOUNTER — Inpatient Hospital Stay (HOSPITAL_COMMUNITY): Payer: Medicare HMO

## 2015-08-03 DIAGNOSIS — K0889 Other specified disorders of teeth and supporting structures: Secondary | ICD-10-CM

## 2015-08-03 LAB — GLUCOSE, CAPILLARY
GLUCOSE-CAPILLARY: 138 mg/dL — AB (ref 65–99)
GLUCOSE-CAPILLARY: 206 mg/dL — AB (ref 65–99)
GLUCOSE-CAPILLARY: 190 mg/dL — AB (ref 65–99)
Glucose-Capillary: 211 mg/dL — ABNORMAL HIGH (ref 65–99)

## 2015-08-03 NOTE — Progress Notes (Signed)
[place] TEAM 1 - Stepdown/ICU TEAM PROGRESS NOTE  Cody [name] PTW:[id number] DOB: 1965/12/13 DOA: 07/30/2015 PCP: Chesley Noon, MD  Admit HPI / Brief Narrative: 50yo M HxTBI, ADD, Anxiety, Depression, HTN, CAD w/ MI, DM2, HLD, and OSA on CPAP who presented to [facility] with increasing shortness of breath, cough, and congestion. He had been diagnosed with pneumonia 3 weeks prior, but the symptoms were not relieved with antibiotics as an outpatient. CT at [facility] revealed a large left-sided pleural effusion, and mediastinal adenopathy suspicious for an endobronchial/hilar mass with lymph node metastases, as well as a right-sided pulmonary nodule (3 mm). No PE was noted.    HPI/Subjective: Patient began complaining of some left anterior jaw pain beginning early this morning with swelling.  He has a broken tooth in that region.  He denies chest pain shortness of breath fevers or chills.  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 - Lung masses / Large L pleural effusion  -CT chest 2/16 w/ collapse and consolidation of L lung w/ narrowing of L mainstem bronchus due to lymph node mass, and probable metastatic lymph nodes in mediastinum -CT head w/o evidence of brain mets  -CT abdomen pelvis noted mets in liver, adrenal glands, and mesentery, as well as L femur subthrochanteric mets to the medullary cavity  -2/15 S/P left thoracentesis 2 L- cytology not helpful -for EBUS Tuesday   L jaw/tooth pain No obvious abscess on exam, but has mutiple carrious teeth as well as numerous broken teeth - check orthopantogram to r/o abscess  Acute respiratory failure with hypoxia secondary to large left pleural effusion Improved with thoracentesis - follow on continuous monitor for now as reaccumulation likely - still requiring 4L [place] support   DM2  A1c 6.4 - CBG modestly elevated - adjust tx slightly and follow    Hx of HTN  BP fluctuating between episodic  hypotension and HTN - follow w/o change   Chronic Grade 2 Diastolic CHF Holding lasix in setting of hypotension - no clinical evidence of signif volume overload at this time   Hyperkalemia Resolved  Hypomagnesemia Replace and follow    Sleep Apnea  Pt refusing CPAP use - do not feel that it is necessary to push this point currently   Severe malnutrition in context of acute illness/injury  Obesity - Body mass index is 33.76 kg/(m^2).  Code Status: FULL Family Communication: No family present at time of exam today Disposition Plan: SDU   Consultants: PCCM  Procedures: 2/14 CT Angiogram at [place] - Occlusion of the left mainstem bronchus. Mass effect on the left mainstem bronchus with associated narrowing from the adjacent mass lesion. -Mediastinum/hila: 2.8 x 4.7 cm lymph node conglomerate subcarinal location. Several mildly enlarged Paraesophageal lymph nodes also identified. -Mild left to right mediastinal shift. 1.2 cm AP window LN. -Heart; Minimal pericardial effusion. -Complete collapseleft lung. 3 mm nodule right upper lobe close associated with the right minor fissure. 4 x 3 mm RML pulmonary nodule image -3 mm LLL nodule . 2/15 left thoracentesis - 2 L amber fluid removed 2/16 CT chest with contrast - Moderate left pleural effusion with collapse and consolidation of the left lung. -narrowing of the left mainstem bronchus likely indicating an obstructing mass lesion although the mass is obscured to visualization by the surrounding postobstructive changes and effusion.  -Mild atelectasis in the right lung base.- Probable metastatic mediastinal lymph nodes, bilateral adrenal gland nodules,and abdominal peritoneal deposits. 2/16 echocardiogram - EF 55-60% - grade 2 diastolic  dysfunction - Pericardium, moderate-sized leftpleural effusion.  Antibiotics: none  DVT prophylaxis: lovenox   Objective: Blood pressure 106/73, pulse 81, temperature 97.8 F (36.6 C), temperature  source Oral, resp. rate 21, height '5\' 8"'$  (1.727 m), weight 100.7 kg (222 lb 0.1 oz), SpO2 100 %.  Intake/Output Summary (Last 24 hours) at 08/03/15 1421 Last data filed at 08/03/15 1416  Gross per 24 hour  Intake   1920 ml  Output   2000 ml  Net    -80 ml   Exam: General: No acute respiratory distress  Lungs: poor air movement th/o L lung fields - no wheezing Cardiovascular: Regular rate and rhythm  Abdomen: Nontender, nondistended, soft, bowel sounds positive, no rebound, no appreciable mass Extremities: No significant cyanosis, clubbing, or edema bilateral lower extremities  Data Reviewed:  Basic Metabolic Panel:  Recent Labs Lab 07/30/15 1010 07/31/15 0520 08/01/15 0550 08/02/15 0442  NA 137 137 132* 135  K 5.4* 4.2 4.6 4.8  CL 100* 98* 94* 96*  CO2 '25 27 24 27  '$ GLUCOSE 65 85 163* 128*  BUN 5* '8 9 12  '$ CREATININE 0.73 0.68 0.55* 0.76  CALCIUM 9.3 9.0 9.2 8.7*  MG  --   --  1.6* 1.7    CBC:  Recent Labs Lab 07/30/15 1153 07/31/15 0520 08/01/15 0550 08/02/15 0442  WBC 9.6 10.5 11.8* 9.6  NEUTROABS 6.8  --  9.2*  --   HGB 12.7* 13.9 13.8 12.9*  HCT 39.2 42.7 41.9 39.4  MCV 92.2 92.0 91.5 91.4  PLT 246 270 264 250    Liver Function Tests:  Recent Labs Lab 08/01/15 0550 08/02/15 0442  AST 44* 45*  ALT 16* 18  ALKPHOS 114 150*  BILITOT 1.4* 0.6  PROT 6.8 5.6*  ALBUMIN 2.9* 2.4*   Coags:  Recent Labs Lab 07/31/15 0520  INR 1.23   CBG:  Recent Labs Lab 08/02/15 1159 08/02/15 1517 08/02/15 2159 08/03/15 0813 08/03/15 1201  GLUCAP 216* 297* 153* 138* 206*    Recent Results (from the past 240 hour(s))  Culture, body fluid-bottle     Status: None (Preliminary result)   Collection Time: 07/30/15  2:23 PM  Result Value Ref Range Status   Specimen Description FLUID PLEURAL LEFT  Final   Special Requests BOTTLES DRAWN AEROBIC AND ANAEROBIC 10CC  Final   Culture NO GROWTH 3 DAYS  Final   Report Status PENDING  Incomplete  Gram stain      Status: None   Collection Time: 07/30/15  2:23 PM  Result Value Ref Range Status   Specimen Description FLUID PLEURAL LEFT  Final   Special Requests NONE  Final   Gram Stain   Final    CYTOSPIN SMEAR WBC PRESENT,BOTH PMN AND MONONUCLEAR NO ORGANISMS SEEN    Report Status 07/30/2015 FINAL  Final  MRSA PCR Screening     Status: None   Collection Time: 07/30/15  7:18 PM  Result Value Ref Range Status   MRSA by PCR NEGATIVE NEGATIVE Final    Comment:        The GeneXpert MRSA Assay (FDA approved for NASAL specimens only), is one component of a comprehensive MRSA colonization surveillance program. It is not intended to diagnose MRSA infection nor to guide or monitor treatment for MRSA infections.      Studies:   Recent x-ray studies have been reviewed in detail by the Attending Physician  Scheduled Meds:  Scheduled Meds: . arformoterol  15 mcg Nebulization BID  . aspirin  81 mg Oral Daily  . atorvastatin  40 mg Oral QPM  . budesonide (PULMICORT) nebulizer solution  0.5 mg Nebulization BID  . carvedilol  3.125 mg Oral BID WC  . citalopram  40 mg Oral QPM  . enoxaparin (LOVENOX) injection  40 mg Subcutaneous Q24H  . feeding supplement (ENSURE ENLIVE)  237 mL Oral BID BM  . insulin aspart  0-15 Units Subcutaneous TID WC  . insulin glargine  8 Units Subcutaneous QHS  . ipratropium-albuterol  3 mL Nebulization TID  . magnesium sulfate 1 - 4 g bolus IVPB  2 g Intravenous Once  . pantoprazole  40 mg Oral Daily    Time spent on care of this patient: 25 mins   [facility] T , MD   [facility] Office  [phone] Pager - Text Page per Shea Evans as per below:  On-Call/Text Page:      [email or web address]      password [email or web address]  If 7PM-7AM, please contact night-coverage [email or web address] Password [email or web address] 08/03/2015, 2:21 PM   LOS: 4 days

## 2015-08-03 NOTE — Progress Notes (Signed)
Pt refusing CPAP for tonight 

## 2015-08-04 ENCOUNTER — Encounter (HOSPITAL_COMMUNITY): Payer: Self-pay | Admitting: Anesthesiology

## 2015-08-04 ENCOUNTER — Inpatient Hospital Stay (HOSPITAL_COMMUNITY): Payer: Medicare HMO

## 2015-08-04 DIAGNOSIS — R0609 Other forms of dyspnea: Secondary | ICD-10-CM

## 2015-08-04 DIAGNOSIS — J948 Other specified pleural conditions: Secondary | ICD-10-CM

## 2015-08-04 LAB — GLUCOSE, CAPILLARY
GLUCOSE-CAPILLARY: 213 mg/dL — AB (ref 65–99)
Glucose-Capillary: 142 mg/dL — ABNORMAL HIGH (ref 65–99)
Glucose-Capillary: 229 mg/dL — ABNORMAL HIGH (ref 65–99)
Glucose-Capillary: 100 mg/dL — ABNORMAL HIGH (ref 65–99)

## 2015-08-04 LAB — CBC
HCT: 36.7 % — ABNORMAL LOW (ref 39.0–52.0)
Hemoglobin: 12 g/dL — ABNORMAL LOW (ref 13.0–17.0)
MCH: 29.7 pg (ref 26.0–34.0)
MCHC: 32.7 g/dL (ref 30.0–36.0)
MCV: 90.8 fL (ref 78.0–100.0)
PLATELETS: 249 10*3/uL (ref 150–400)
RBC: 4.04 MIL/uL — AB (ref 4.22–5.81)
RDW: 14.4 % (ref 11.5–15.5)
WBC: 10.8 10*3/uL — ABNORMAL HIGH (ref 4.0–10.5)

## 2015-08-04 LAB — BASIC METABOLIC PANEL
Anion gap: 9 (ref 5–15)
BUN: 12 mg/dL (ref 6–20)
CALCIUM: 8.4 mg/dL — AB (ref 8.9–10.3)
CHLORIDE: 96 mmol/L — AB (ref 101–111)
CO2: 28 mmol/L (ref 22–32)
CREATININE: 0.59 mg/dL — AB (ref 0.61–1.24)
GFR calc non Af Amer: >60 mL/min (ref >60)
Glucose, Bld: 120 mg/dL — ABNORMAL HIGH (ref 65–99)
Potassium: 4 mmol/L (ref 3.5–5.1)
Sodium: 133 mmol/L — ABNORMAL LOW (ref 135–145)

## 2015-08-04 LAB — PROTIME-INR
INR: 1.61 — ABNORMAL HIGH (ref 0.00–1.49)
PROTHROMBIN TIME: 19.1 s — AB (ref 11.6–15.2)

## 2015-08-04 LAB — CULTURE, BODY FLUID-BOTTLE

## 2015-08-04 LAB — CULTURE, BODY FLUID W GRAM STAIN -BOTTLE: Culture: NO GROWTH

## 2015-08-04 LAB — APTT: aPTT: 32 seconds (ref 24–37)

## 2015-08-04 MED ORDER — PHYTONADIONE 5 MG PO TABS
5.0000 mg | ORAL_TABLET | Freq: Every day | ORAL | Status: AC
  Administered 2015-08-04 – 2015-08-06 (×2): 5 mg via ORAL
  Filled 2015-08-04 (×3): qty 1

## 2015-08-04 MED ORDER — CLINDAMYCIN HCL 300 MG PO CAPS
300.0000 mg | ORAL_CAPSULE | Freq: Three times a day (TID) | ORAL | Status: DC
  Administered 2015-08-04 – 2015-08-08 (×13): 300 mg via ORAL
  Filled 2015-08-04 (×14): qty 1

## 2015-08-04 MED ORDER — SODIUM CHLORIDE 0.9 % IV SOLN
INTRAVENOUS | Status: DC
  Administered 2015-08-04 – 2015-08-07 (×2): via INTRAVENOUS

## 2015-08-04 NOTE — Care Management Note (Signed)
Case Management Note  Patient Details  Name: Cody [name] MRN: [id number] Date of Birth: January 12, 1966  Subjective/Objective:  Date: 08/04/15 Spoke with patient at the bedside.  Introduced self as Tourist information centre manager and explained role in discharge planning and how to be reached.  Verified patient lives in town, with spouse, pta indep. Expressed potential need for no  DME.  Verified patient anticipates to go home with family, at time of discharge and will have full-time supervision by family at this time to best of their knowledge. Patient denied needing help with their medication.  Patient drives  And also is driven by wife to MD appointments.  Verified patient has PCP Melford Aase.   Plan: CM will continue to follow for discharge planning and [facility] resources.                  Action/Plan: Patient is for EBUS on 2/21.    Expected Discharge Date:                  Expected Discharge Plan:  Home/Self Care  In-House Referral:     Discharge planning Services  CM Consult  Post Acute Care Choice:    Choice offered to:     DME Arranged:    DME Agency:     HH Arranged:    HH Agency:     Status of Service:  In process, will continue to follow  Medicare Important Message Given:  Yes Date Medicare IM Given:    Medicare IM give by:    Date Additional Medicare IM Given:    Additional Medicare Important Message give by:     If discussed at [place] of Stay Meetings, dates discussed:    Additional Comments:  Zenon Mayo, RN 08/04/2015, 4:01 PM

## 2015-08-04 NOTE — Progress Notes (Signed)
[place] TEAM 1 - Stepdown/ICU TEAM PROGRESS NOTE  Cody [name] DGL:[id number] DOB: 07-24-1965 DOA: 07/30/2015 PCP: Chesley Noon, MD  Admit HPI / Brief Narrative: 50yo M HxTBI, ADD, Anxiety, Depression, HTN, CAD w/ MI, DM2, HLD, and OSA on CPAP who presented to [facility] with increasing shortness of breath, cough, and congestion. He had been diagnosed with pneumonia 3 weeks prior, but the symptoms were not relieved with antibiotics as an outpatient. CT at [facility] revealed a large left-sided pleural effusion, and mediastinal adenopathy suspicious for an endobronchial/hilar mass with lymph node metastases, as well as a right-sided pulmonary nodule (3 mm). No PE was noted.    HPI/Subjective: The patient reports ongoing pain in his mouth as described yesterday.  He denies chest pain or shortness of breath at rest.  He has no new complaints today.  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 - Lung masses / Large L pleural effusion / collapse  -CT chest 2/16 w/ collapse and consolidation of L lung w/ narrowing of L mainstem bronchus due to lymph node mass, and probable metastatic lymph nodes in mediastinum -CT head w/o evidence of brain mets  -CT abdomen pelvis noted mets in liver, adrenal glands, and mesentery, as well as L femur subthrochanteric mets to the medullary cavity  -2/15 S/P left thoracentesis 2 L- cytology not helpful -for EBUS tomorrow   L jaw/tooth pain No obvious abscess on exam, but has numerous broken teeth - orthopantogram did not reveal an abscess - initiate empiric antibiotic for tooth and gum infection  Acute respiratory failure with hypoxia secondary to large left pleural effusion The patient remains stable on 3 L nasal cannula despite the fact that his entire left lung is collapsed - follow on stepdown unit until EBUS completed  DM2  A1c 6.4 - CBG reasonably controlled  Hx of HTN  BP fluctuating between episodic  hypotension and HTN - follow w/o change   Chronic Grade 2 Diastolic CHF Holding lasix in setting of hypotension - no clinical evidence of signif volume overload at this time   Hyperkalemia Resolved  Hypomagnesemia Recheck in a.m.  Mild coagulopathy Likely a combination of poor nutrition plus liver metastasis - dose with vitamin K and follow up in a.m.  Sleep Apnea  Pt refusing CPAP use - do not feel that it is necessary to push this point currently   Severe malnutrition in context of acute illness/injury  Obesity - Body mass index is 33.13 kg/(m^2).  Code Status: FULL Family Communication: No family present at time of exam today Disposition Plan: SDU   Consultants: PCCM  Procedures: 2/14 CT Angiogram at [place] - Occlusion of the left mainstem bronchus. Mass effect on the left mainstem bronchus with associated narrowing from the adjacent mass lesion. -Mediastinum/hila: 2.8 x 4.7 cm lymph node conglomerate subcarinal location. Several mildly enlarged Paraesophageal lymph nodes also identified. -Mild left to right mediastinal shift. 1.2 cm AP window LN. -Heart; Minimal pericardial effusion. -Complete collapseleft lung. 3 mm nodule right upper lobe close associated with the right minor fissure. 4 x 3 mm RML pulmonary nodule image -3 mm LLL nodule . 2/15 left thoracentesis - 2 L amber fluid removed 2/16 CT chest with contrast - Moderate left pleural effusion with collapse and consolidation of the left lung. -narrowing of the left mainstem bronchus likely indicating an obstructing mass lesion although the mass is obscured to visualization by the surrounding postobstructive changes and effusion.  -Mild atelectasis in the right lung base.- Probable metastatic  mediastinal lymph nodes, bilateral adrenal gland nodules,and abdominal peritoneal deposits. 2/16 echocardiogram - EF 55-60% - grade 2 diastolic dysfunction - Pericardium, moderate-sized leftpleural  effusion.  Antibiotics: none  DVT prophylaxis: lovenox   Objective: Blood pressure 107/64, pulse 76, temperature 97.6 F (36.4 C), temperature source Oral, resp. rate 21, height '5\' 8"'$  (1.727 m), weight 98.8 kg (217 lb 13 oz), SpO2 100 %.  Intake/Output Summary (Last 24 hours) at 08/04/15 1056 Last data filed at 08/04/15 1000  Gross per 24 hour  Intake    960 ml  Output   2050 ml  Net  -1090 ml   Exam: General: No acute respiratory distress  Lungs: L lung fields w/o air movement - no wheezing Cardiovascular: Regular rate and rhythm  Abdomen: Nontender, nondistended, soft, bowel sounds positive, no rebound, no appreciable mass Extremities: No significant cyanosis, clubbing, edema bilateral lower extremities  Data Reviewed:  Basic Metabolic Panel:  Recent Labs Lab 07/30/15 1010 07/31/15 0520 08/01/15 0550 08/02/15 0442 08/04/15 0428  NA 137 137 132* 135 133*  K 5.4* 4.2 4.6 4.8 4.0  CL 100* 98* 94* 96* 96*  CO2 '25 27 24 27 28  '$ GLUCOSE 65 85 163* 128* 120*  BUN 5* '8 9 12 12  '$ CREATININE 0.73 0.68 0.55* 0.76 0.59*  CALCIUM 9.3 9.0 9.2 8.7* 8.4*  MG  --   --  1.6* 1.7  --     CBC:  Recent Labs Lab 07/30/15 1153 07/31/15 0520 08/01/15 0550 08/02/15 0442 08/04/15 0428  WBC 9.6 10.5 11.8* 9.6 10.8*  NEUTROABS 6.8  --  9.2*  --   --   HGB 12.7* 13.9 13.8 12.9* 12.0*  HCT 39.2 42.7 41.9 39.4 36.7*  MCV 92.2 92.0 91.5 91.4 90.8  PLT 246 270 264 250 249    Liver Function Tests:  Recent Labs Lab 08/01/15 0550 08/02/15 0442  AST 44* 45*  ALT 16* 18  ALKPHOS 114 150*  BILITOT 1.4* 0.6  PROT 6.8 5.6*  ALBUMIN 2.9* 2.4*   Coags:  Recent Labs Lab 07/31/15 0520 08/04/15 0428  INR 1.23 1.61*   CBG:  Recent Labs Lab 08/02/15 2159 08/03/15 0813 08/03/15 1201 08/03/15 1617 08/03/15 2106  GLUCAP 153* 138* 206* 190* 211*    Recent Results (from the past 240 hour(s))  Culture, body fluid-bottle     Status: None (Preliminary result)   Collection  Time: 07/30/15  2:23 PM  Result Value Ref Range Status   Specimen Description FLUID PLEURAL LEFT  Final   Special Requests BOTTLES DRAWN AEROBIC AND ANAEROBIC 10CC  Final   Culture NO GROWTH 4 DAYS  Final   Report Status PENDING  Incomplete  Gram stain     Status: None   Collection Time: 07/30/15  2:23 PM  Result Value Ref Range Status   Specimen Description FLUID PLEURAL LEFT  Final   Special Requests NONE  Final   Gram Stain   Final    CYTOSPIN SMEAR WBC PRESENT,BOTH PMN AND MONONUCLEAR NO ORGANISMS SEEN    Report Status 07/30/2015 FINAL  Final  MRSA PCR Screening     Status: None   Collection Time: 07/30/15  7:18 PM  Result Value Ref Range Status   MRSA by PCR NEGATIVE NEGATIVE Final    Comment:        The GeneXpert MRSA Assay (FDA approved for NASAL specimens only), is one component of a comprehensive MRSA colonization surveillance program. It is not intended to diagnose MRSA infection nor to guide  or monitor treatment for MRSA infections.      Studies:   Recent x-ray studies have been reviewed in detail by the Attending Physician  Scheduled Meds:  Scheduled Meds: . arformoterol  15 mcg Nebulization BID  . aspirin  81 mg Oral Daily  . atorvastatin  40 mg Oral QPM  . budesonide (PULMICORT) nebulizer solution  0.5 mg Nebulization BID  . carvedilol  3.125 mg Oral BID WC  . citalopram  40 mg Oral QPM  . enoxaparin (LOVENOX) injection  40 mg Subcutaneous Q24H  . feeding supplement (ENSURE ENLIVE)  237 mL Oral BID BM  . insulin aspart  0-15 Units Subcutaneous TID WC  . insulin glargine  8 Units Subcutaneous QHS  . ipratropium-albuterol  3 mL Nebulization TID  . magnesium sulfate 1 - 4 g bolus IVPB  2 g Intravenous Once  . pantoprazole  40 mg Oral Daily    Time spent on care of this patient: 25 mins   [facility] T , MD   [facility] Office  [phone] Pager - Text Page per Shea Evans as per below:  On-Call/Text Page:      [email or web address]       password [email or web address]  If 7PM-7AM, please contact night-coverage [email or web address] Password [email or web address] 08/04/2015, 10:56 AM   LOS: 5 days

## 2015-08-04 NOTE — Progress Notes (Signed)
Name: Cody [name] MRN: [id number] DOB: 1965-07-22    ADMISSION DATE:  07/30/2015 CONSULTATION DATE:  07/31/2015  REFERRING MD:  Sherral Hammers  CHIEF COMPLAINT:  SOB  SUBJECTIVE:  Didn't wear CPAP last pm Denies dyspnea.   VITAL SIGNS: Temp:  [97.6 F (36.4 C)-98.7 F (37.1 C)] 97.6 F (36.4 C) (02/20 0738) Pulse Rate:  [76-92] 76 (02/20 0738) Resp:  [19-25] 21 (02/20 0738) BP: (100-116)/(60-101) 107/64 mmHg (02/20 0738) SpO2:  [95 %-100 %] 100 % (02/20 0738) Weight:  [98.8 kg (217 lb 13 oz)] 98.8 kg (217 lb 13 oz) (02/20 0458)  PHYSICAL EXAMINATION: General:  NAD Neuro:  No gross deficits HEENT:  [place]/AT, PERRL, MMM Cardiovascular:  RRR Lungs:  Decreased BS on L throughout. R with wheezes Abdomen:  Soft, obese Musculoskeletal:  No edema Skin:  Warm and well perfused   Recent Labs Lab 08/01/15 0550 08/02/15 0442 08/04/15 0428  NA 132* 135 133*  K 4.6 4.8 4.0  CL 94* 96* 96*  CO2 '24 27 28  '$ BUN '9 12 12  '$ CREATININE 0.55* 0.76 0.59*  GLUCOSE 163* 128* 120*    Recent Labs Lab 08/01/15 0550 08/02/15 0442 08/04/15 0428  HGB 13.8 12.9* 12.0*  HCT 41.9 39.4 36.7*  WBC 11.8* 9.6 10.8*  PLT 264 250 249   Dg Orthopantogram  08/03/2015  CLINICAL DATA:  Left-sided jaw pain, area of tooth 23. Edema in floor of mouth. EXAM: ORTHOPANTOGRAM/PANORAMIC COMPARISON:  Limited correlation made with head CT 07/31/2015. FINDINGS: Multiple teeth are absent. Many of the maxillary teeth are fragmented. Evaluation of the midline structures is limited by Panorex technique due to typical artifact in this area. There is possible lucency surrounding the left mandibular lateral incisor and cuspid. No evidence of acute fracture or dislocation. IMPRESSION: Probable periapical lucency surrounding the paramedian left mandibular teeth, suspicious for periodontal disease, suboptimally evaluated by Panorex technique due to artifact. Electronically Signed   By: Richardean Sale M.D.   On: 08/03/2015 19:07    Dg Chest Port 1 View  08/04/2015  CLINICAL DATA:  50 year old male with left-sided pleural effusion. EXAM: PORTABLE CHEST 1 VIEW COMPARISON:  Chest radiograph dated 07/30/2015 CT dated 07/31/2015 FINDINGS: There is complete opacification of left hemithorax. The right lung is clear. There is no pneumothorax. There is persistent high-grade narrowing of the left mainstem bronchus as seen on the prior study and concerning for underlying obstructive mass. There is no significant mediastinal shift. The osseous structures are grossly unremarkable. IMPRESSION: Persistent opacification of the left hemithorax with narrowing of the left mainstem bronchus. Electronically Signed   By: Anner Crete M.D.   On: 08/04/2015 07:39    ASSESSMENT / PLAN: 50 year old male smoker (30-pack-year) with history of CAD s/p PCI in 2000 and 2010, CHF, GERD, DM, HTN, asthma, OSA, depression/anxiety presenting with dyspnea to [facility] found to have large left sided pleural effusion and mediastinal adenopathy on CT chest. Seen by IR on 2/15 for left sided thoracentesis by IR -- 2L amber colored fluid removed.  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 Narrowing of left mainstem bronchus. CT head with no intracranial metastasis. CT abd/pelvis with intraabdominal metastases seen in liver, adrenal glands, and mesentery. 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 Fluid analysis exudative but culture NGTD and procalcitonin <0.1 -cytology negative  We will plan to perform FOB with possible endobronchial biopsies, EBUS to sample R paratracheal and  subcarinal nodes (and any other discernable LAD). I explained the procedure to the patient, risks and benefits. He understands and agrees to proceed.   OSA: Patient refusing to use CPAP. Will consider it if his breathing worsens.  Diastolic CHF:  Echo with LV EF 55% to 60% and grade 2 diastolic dysfunction.    Baltazar Apo, MD, PhD 08/04/2015, 10:56 AM [facility] [phone] or if no answer [phone]

## 2015-08-04 NOTE — Care Management Important Message (Signed)
Important Message  Patient Details  Name: Cody [name] MRN: [id number] Date of Birth: [birth year]   Medicare Important Message Given:  Yes    Louise Victory, Leroy Sea 08/04/2015, 11:17 AM

## 2015-08-04 NOTE — Anesthesia Preprocedure Evaluation (Addendum)
Anesthesia Evaluation  Patient identified by MRN, date of birth, ID band Patient awake    Reviewed: Allergy & Precautions, NPO status , Patient's Chart, lab work & pertinent test results, reviewed documented beta blocker date and time   Airway Mallampati: III  TM Distance: >3 FB Neck ROM: Full    Dental  (+) Poor Dentition, Missing   Pulmonary shortness of breath, asthma , sleep apnea and Continuous Positive Airway Pressure Ventilation , pneumonia, resolved, Current Smoker,  Left endobronchial mass Left pleural effusion  Decreased breath sounds Left posterior base  + rhonchi  + decreased breath sounds      Cardiovascular hypertension, Pt. on medications and Pt. on home beta blockers + CAD, + Past MI, + Cardiac Stents, +CHF and + DOE  Normal cardiovascular exam Rhythm:Regular Rate:Normal  MI in 2000 PTCA w/ stent placement 2000, 2010   Neuro/Psych  Headaches, PSYCHIATRIC DISORDERS Anxiety Depression ADDHx/o Traumatic brain injury    GI/Hepatic Neg liver ROS, GERD  Medicated and Controlled,  Endo/Other  diabetes, Well Controlled, Type 2, Oral Hypoglycemic AgentsObesity Hyperlipidemia  Renal/GU negative Renal ROS  negative genitourinary   Musculoskeletal  (+) Arthritis ,   Abdominal (+) + obese,   Peds  Hematology   Anesthesia Other Findings   Reproductive/Obstetrics                           Anesthesia Physical Anesthesia Plan  ASA: III  Anesthesia Plan: General   Post-op Pain Management:    Induction: Intravenous  Airway Management Planned: Oral ETT  Additional Equipment:   Intra-op Plan:   Post-operative Plan: Extubation in OR  Informed Consent: I have reviewed the patients History and Physical, chart, labs and discussed the procedure including the risks, benefits and alternatives for the proposed anesthesia with the patient or authorized representative who has indicated  his/her understanding and acceptance.   Dental advisory given  Plan Discussed with:   Anesthesia Plan Comments:        Anesthesia Quick Evaluation

## 2015-08-05 ENCOUNTER — Inpatient Hospital Stay (HOSPITAL_COMMUNITY)
Admission: EM | Disposition: A | Discharge status: Home / Self Care | Payer: Medicare HMO | Source: Home / Self Care | Attending: Internal Medicine

## 2015-08-05 ENCOUNTER — Inpatient Hospital Stay (HOSPITAL_COMMUNITY): Payer: Medicare HMO | Admitting: Anesthesiology

## 2015-08-05 ENCOUNTER — Encounter (HOSPITAL_COMMUNITY): Payer: Self-pay | Admitting: Critical Care Medicine

## 2015-08-05 DIAGNOSIS — E118 Type 2 diabetes mellitus with unspecified complications: Secondary | ICD-10-CM | POA: Diagnosis present

## 2015-08-05 DIAGNOSIS — R918 Other nonspecific abnormal finding of lung field: Secondary | ICD-10-CM | POA: Diagnosis present

## 2015-08-05 DIAGNOSIS — J189 Pneumonia, unspecified organism: Secondary | ICD-10-CM | POA: Diagnosis present

## 2015-08-05 HISTORY — PX: VIDEO BRONCHOSCOPY WITH ENDOBRONCHIAL ULTRASOUND: SHX6177

## 2015-08-05 LAB — COMPREHENSIVE METABOLIC PANEL
ALBUMIN: 2.3 g/dL — AB (ref 3.5–5.0)
ALK PHOS: 134 U/L — AB (ref 38–126)
ALT: 22 U/L (ref 17–63)
AST: 37 U/L (ref 15–41)
Anion gap: 12 (ref 5–15)
BUN: 9 mg/dL (ref 6–20)
CALCIUM: 8.4 mg/dL — AB (ref 8.9–10.3)
CHLORIDE: 98 mmol/L — AB (ref 101–111)
CO2: 27 mmol/L (ref 22–32)
CREATININE: 0.62 mg/dL (ref 0.61–1.24)
GFR calc non Af Amer: >60 mL/min (ref >60)
GLUCOSE: 101 mg/dL — AB (ref 65–99)
Potassium: 4.2 mmol/L (ref 3.5–5.1)
SODIUM: 137 mmol/L (ref 135–145)
Total Bilirubin: 0.5 mg/dL (ref 0.3–1.2)
Total Protein: 5.7 g/dL — ABNORMAL LOW (ref 6.5–8.1)

## 2015-08-05 LAB — PROTIME-INR
INR: 1.51 — AB (ref 0.00–1.49)
Prothrombin Time: 18.3 seconds — ABNORMAL HIGH (ref 11.6–15.2)

## 2015-08-05 LAB — PH, BODY FLUID: pH, Body Fluid: 8.2

## 2015-08-05 LAB — GLUCOSE, CAPILLARY
GLUCOSE-CAPILLARY: 107 mg/dL — AB (ref 65–99)
GLUCOSE-CAPILLARY: 172 mg/dL — AB (ref 65–99)
GLUCOSE-CAPILLARY: 158 mg/dL — AB (ref 65–99)

## 2015-08-05 LAB — MISC LABCORP TEST (SEND OUT): LABCORP TEST CODE: 88062

## 2015-08-05 SURGERY — BRONCHOSCOPY, WITH EBUS
Anesthesia: General | Laterality: Left

## 2015-08-05 MED ORDER — GLYCOPYRROLATE 0.2 MG/ML IJ SOLN
INTRAMUSCULAR | Status: DC | PRN
  Administered 2015-08-05: 0.6 mg via INTRAVENOUS

## 2015-08-05 MED ORDER — FENTANYL CITRATE (PF) 250 MCG/5ML IJ SOLN
INTRAMUSCULAR | Status: AC
  Filled 2015-08-05: qty 5

## 2015-08-05 MED ORDER — LIDOCAINE HCL (CARDIAC) 20 MG/ML IV SOLN
INTRAVENOUS | Status: AC
  Filled 2015-08-05: qty 5

## 2015-08-05 MED ORDER — 0.9 % SODIUM CHLORIDE (POUR BTL) OPTIME
TOPICAL | Status: DC | PRN
  Administered 2015-08-05: 1000 mL

## 2015-08-05 MED ORDER — FENTANYL CITRATE (PF) 100 MCG/2ML IJ SOLN
25.0000 ug | INTRAMUSCULAR | Status: DC | PRN
  Administered 2015-08-05: 25 ug via INTRAVENOUS

## 2015-08-05 MED ORDER — EPHEDRINE SULFATE 50 MG/ML IJ SOLN
INTRAMUSCULAR | Status: AC
  Filled 2015-08-05: qty 1

## 2015-08-05 MED ORDER — PROPOFOL 10 MG/ML IV BOLUS
INTRAVENOUS | Status: AC
  Filled 2015-08-05: qty 20

## 2015-08-05 MED ORDER — ROCURONIUM BROMIDE 100 MG/10ML IV SOLN
INTRAVENOUS | Status: DC | PRN
  Administered 2015-08-05: 35 mg via INTRAVENOUS

## 2015-08-05 MED ORDER — FENTANYL CITRATE (PF) 100 MCG/2ML IJ SOLN
INTRAMUSCULAR | Status: DC | PRN
  Administered 2015-08-05: 150 ug via INTRAVENOUS

## 2015-08-05 MED ORDER — ALBUTEROL SULFATE HFA 108 (90 BASE) MCG/ACT IN AERS
INHALATION_SPRAY | RESPIRATORY_TRACT | Status: DC | PRN
  Administered 2015-08-05 (×2): 2 via RESPIRATORY_TRACT

## 2015-08-05 MED ORDER — FENTANYL CITRATE (PF) 100 MCG/2ML IJ SOLN
INTRAMUSCULAR | Status: AC
  Filled 2015-08-05: qty 2

## 2015-08-05 MED ORDER — PROPOFOL 10 MG/ML IV BOLUS
INTRAVENOUS | Status: DC | PRN
  Administered 2015-08-05: 30 mg via INTRAVENOUS
  Administered 2015-08-05: 150 mg via INTRAVENOUS

## 2015-08-05 MED ORDER — NEOSTIGMINE METHYLSULFATE 10 MG/10ML IV SOLN
INTRAVENOUS | Status: DC | PRN
  Administered 2015-08-05: 4 mg via INTRAVENOUS

## 2015-08-05 MED ORDER — MEPERIDINE HCL 25 MG/ML IJ SOLN: 6.2500 mg | INTRAMUSCULAR | Status: DC | PRN

## 2015-08-05 MED ORDER — ROCURONIUM BROMIDE 50 MG/5ML IV SOLN
INTRAVENOUS | Status: AC
  Filled 2015-08-05: qty 1

## 2015-08-05 MED ORDER — ENOXAPARIN SODIUM 40 MG/0.4ML ~~LOC~~ SOLN
40.0000 mg | SUBCUTANEOUS | Status: DC
Start: 2015-08-06 — End: 2015-08-08
  Administered 2015-08-06 – 2015-08-07 (×2): 40 mg via SUBCUTANEOUS
  Filled 2015-08-05 (×2): qty 0.4

## 2015-08-05 MED ORDER — SODIUM CHLORIDE 0.9 % IJ SOLN
INTRAMUSCULAR | Status: AC
  Filled 2015-08-05: qty 10

## 2015-08-05 MED ORDER — ONDANSETRON HCL 4 MG/2ML IJ SOLN: 4.0000 mg | Freq: Once | INTRAMUSCULAR | Status: DC | PRN

## 2015-08-05 MED ORDER — LACTATED RINGERS IV SOLN
INTRAVENOUS | Status: DC | PRN
  Administered 2015-08-05 (×2): via INTRAVENOUS

## 2015-08-05 MED ORDER — SUCCINYLCHOLINE CHLORIDE 20 MG/ML IJ SOLN
INTRAMUSCULAR | Status: AC
  Filled 2015-08-05: qty 1

## 2015-08-05 MED ORDER — ONDANSETRON HCL 4 MG/2ML IJ SOLN
INTRAMUSCULAR | Status: AC
  Filled 2015-08-05: qty 2

## 2015-08-05 MED ORDER — MIDAZOLAM HCL 5 MG/5ML IJ SOLN
INTRAMUSCULAR | Status: DC | PRN
  Administered 2015-08-05: 2 mg via INTRAVENOUS

## 2015-08-05 MED ORDER — MIDAZOLAM HCL 2 MG/2ML IJ SOLN
INTRAMUSCULAR | Status: AC
  Filled 2015-08-05: qty 2

## 2015-08-05 MED ORDER — LIDOCAINE HCL (CARDIAC) 20 MG/ML IV SOLN
INTRAVENOUS | Status: DC | PRN
  Administered 2015-08-05: 70 mg via INTRAVENOUS

## 2015-08-05 MED ORDER — ONDANSETRON HCL 4 MG/2ML IJ SOLN
INTRAMUSCULAR | Status: DC | PRN
  Administered 2015-08-05: 4 mg via INTRAVENOUS

## 2015-08-05 SURGICAL SUPPLY — 28 items
BRUSH CYTOL CELLEBRITY 1.5X140 (MISCELLANEOUS) IMPLANT
CANISTER SUCTION 2500CC (MISCELLANEOUS) ×3 IMPLANT
CONT SPEC 4OZ CLIKSEAL STRL BL (MISCELLANEOUS) ×3 IMPLANT
COVER DOME SNAP 22 D (MISCELLANEOUS) ×3 IMPLANT
COVER TABLE BACK 60X90 (DRAPES) ×3 IMPLANT
FORCEPS BIOP RJ4 1.8 (CUTTING FORCEPS) IMPLANT
GAUZE SPONGE 4X4 12PLY STRL (GAUZE/BANDAGES/DRESSINGS) IMPLANT
GLOVE BIO SURGEON STRL SZ7.5 (GLOVE) ×3 IMPLANT
GOWN STRL REUS W/ TWL LRG LVL3 (GOWN DISPOSABLE) ×1 IMPLANT
GOWN STRL REUS W/TWL LRG LVL3 (GOWN DISPOSABLE) ×3
KIT CLEAN ENDO COMPLIANCE (KITS) ×6 IMPLANT
KIT ROOM TURNOVER OR (KITS) ×3 IMPLANT
MARKER SKIN DUAL TIP RULER LAB (MISCELLANEOUS) ×3 IMPLANT
NDL BIOPSY TRANSBRONCH 21G (NEEDLE) IMPLANT
NEEDLE BIOPSY TRANSBRONCH 21G (NEEDLE) IMPLANT
NEEDLE SONO TIP II EBUS (NEEDLE) IMPLANT
NS IRRIG 1000ML POUR BTL (IV SOLUTION) ×3 IMPLANT
OIL SILICONE PENTAX (PARTS (SERVICE/REPAIRS)) IMPLANT
PAD ARMBOARD 7.5X6 YLW CONV (MISCELLANEOUS) ×6 IMPLANT
STOPCOCK 4 WAY LG BORE MALE ST (IV SETS) ×2 IMPLANT
SYR 20CC LL (SYRINGE) ×3 IMPLANT
SYR 20ML ECCENTRIC (SYRINGE) ×6 IMPLANT
SYR 3ML LL SCALE MARK (SYRINGE) ×2 IMPLANT
SYR 5ML LUER SLIP (SYRINGE) ×3 IMPLANT
TOWEL OR 17X24 6PK STRL BLUE (TOWEL DISPOSABLE) ×3 IMPLANT
TRAP SPECIMEN MUCOUS 40CC (MISCELLANEOUS) IMPLANT
TUBE CONNECTING 20'X1/4 (TUBING) ×2
TUBE CONNECTING 20X1/4 (TUBING) ×4 IMPLANT

## 2015-08-05 NOTE — Anesthesia Procedure Notes (Signed)
Procedure Name: Intubation Date/Time: 08/05/2015 7:39 AM Performed by: Merrilyn Puma B Pre-anesthesia Checklist: Patient identified, Timeout performed, Emergency Drugs available, Suction available and Patient being monitored Patient Re-evaluated:Patient Re-evaluated prior to inductionOxygen Delivery Method: Circle system utilized Preoxygenation: Pre-oxygenation with 100% oxygen Intubation Type: IV induction Ventilation: Mask ventilation without difficulty Grade View: Grade I Tube type: Oral Tube size: 8.5 mm Number of attempts: 1 Airway Equipment and Method: Video-laryngoscopy and Stylet Placement Confirmation: CO2 detector,  positive ETCO2,  ETT inserted through vocal cords under direct vision and breath sounds checked- equal and bilateral Secured at: 23 cm Tube secured with: Tape Dental Injury: Teeth and Oropharynx as per pre-operative assessment  Difficulty Due To: Difficult Airway- due to dentition Comments: glidescope used electively due to poor dentition

## 2015-08-05 NOTE — Op Note (Signed)
Video Bronchoscopy with Endobronchial Ultrasound Procedure Note  Date of Operation: 08/05/2015  Pre-op Diagnosis: Left lower lobe mass  Post-op Diagnosis: same, probable primary lung cancer  Surgeon: Baltazar Apo  Assistants: none  Anesthesia: General endotracheal anesthesia  Operation: Flexible video fiberoptic bronchoscopy with endobronchial ultrasound and biopsies.  Estimated Blood Loss: Minimal  Complications: none apparent  Indications and History: JEANLUC WEGMAN is a 50 y.o. male with history of tobacco use, coronary disease, diabetes and sleep apnea. He was noted to have a large left pleural effusion with a left lower lobe consolidation and probable mass on CT scanof the chest. Also noted was enlargement and irregularity of the subcarinal node and enlargement of  Paratracheal nodes. Recommendation was made to attempt tissue diagnosis via bronchoscopy with biopsies, nodal sampling utilizing endobronchial ultrasound.  The risks, benefits, complications, treatment options and expected outcomes were discussed with the patient.  The possibilities of pneumothorax, pneumonia, reaction to medication, pulmonary aspiration, perforation of a viscus, bleeding, failure to diagnose a condition and creating a complication requiring transfusion or operation were discussed with the patient who freely signed the consent.    Description of Procedure: The patient was examined in the preoperative area and history and data from the preprocedure consultation were reviewed. It was deemed appropriate to proceed.  The patient was taken to OR10, identified as Lorenda Hatchet and the procedure verified as Flexible Video Fiberoptic Bronchoscopy.  A Time Out was held and the above information confirmed. After being taken to the operating room general anesthesia was initiated and the patient  was orally intubated. The video fiberoptic bronchoscope was introduced via the endotracheal tube and a general inspection was  performed which showed normal right-sided airways, and a large white mass that appeared to be emanating from the narrowed left lower lobe bronchus extending up to the left upper lobe carina and partially occluding the orifice to the left upper lobe. The standard scope was then withdrawn and the endobronchial ultrasound was used to identify and characterize the peritracheal, hilar and bronchial lymph nodes. Inspection showed significant enlargement and irregularity of the station 7 note. Also identified were enlarged paratracheal nodes particularly posteriorly. Using real-time ultrasound guidance Wang needle biopsies were take from Station 7 and 2R nodes and were sent for cytology. Following nodal sampling the standard scope was reintroduced via the endotracheal tube  And endobronchial biopsies and endobronchial brushings were obtained from the endobronchial lesion at the left lower lobe / left upper lobe carina. The patient tolerated the procedure well without apparent complications. There was no significant blood loss. The bronchoscope was withdrawn. Anesthesia was reversed and the patient was taken to the PACU for recovery.   Samples: 1. Wang needle biopsies from 2R node 2. Wang needle biopsies from 7 node 3. Endobronchial brushings from the left lower lobe 4. Endobronchial biopsies from the left lower lobe  Plans:  The patient will be transferred back to his inpatient room when recovered from anesthesia. We will review the cytology, pathology results with the patient when they become available.    Baltazar Apo, MD, PhD 08/05/2015, 8:59 AM [facility] [phone] or if no answer [phone]

## 2015-08-05 NOTE — Anesthesia Postprocedure Evaluation (Signed)
Anesthesia Post Note  Patient: Cody [name]  Procedure(s) Performed: Procedure(s) (LRB): VIDEO BRONCHOSCOPY WITH ENDOBRONCHIAL ULTRASOUND with Biopsy (Left)  Patient location during evaluation: PACU Anesthesia Type: General Level of consciousness: awake and alert and oriented Pain management: pain level controlled Vital Signs Assessment: post-procedure vital signs reviewed and stable Respiratory status: spontaneous breathing, nonlabored ventilation, respiratory function stable and patient connected to nasal cannula oxygen Cardiovascular status: blood pressure returned to baseline and stable Postop Assessment: no signs of nausea or vomiting Anesthetic complications: no    Last Vitals:  Filed Vitals:   08/05/15 0955 08/05/15 1020  BP:  120/74  Pulse:  87  Temp: 37.2 C 36.4 C  Resp:  25    Last Pain:  Filed Vitals:   08/05/15 1022  PainSc: 2                  Dontay Harm A.

## 2015-08-05 NOTE — Progress Notes (Signed)
Report given to philip lopez rn as caregiver 

## 2015-08-05 NOTE — H&P (View-Only) (Signed)
Name: Cody [name] MRN: [id number] DOB: [birth year]    ADMISSION DATE:  07/30/2015 CONSULTATION DATE:  07/31/2015  REFERRING MD:  Sherral Hammers  CHIEF COMPLAINT:  SOB  SUBJECTIVE:  Didn't wear CPAP last pm Denies dyspnea.   VITAL SIGNS: Temp:  [97.6 F (36.4 C)-98.7 F (37.1 C)] 97.6 F (36.4 C) (02/20 0738) Pulse Rate:  [76-92] 76 (02/20 0738) Resp:  [19-25] 21 (02/20 0738) BP: (100-116)/(60-101) 107/64 mmHg (02/20 0738) SpO2:  [95 %-100 %] 100 % (02/20 0738) Weight:  [98.8 kg (217 lb 13 oz)] 98.8 kg (217 lb 13 oz) (02/20 0458)  PHYSICAL EXAMINATION: General:  NAD Neuro:  No gross deficits HEENT:  [place]/AT, PERRL, MMM Cardiovascular:  RRR Lungs:  Decreased BS on L throughout. R with wheezes Abdomen:  Soft, obese Musculoskeletal:  No edema Skin:  Warm and well perfused   Recent Labs Lab 08/01/15 0550 08/02/15 0442 08/04/15 0428  NA 132* 135 133*  K 4.6 4.8 4.0  CL 94* 96* 96*  CO2 '24 27 28  '$ BUN '9 12 12  '$ CREATININE 0.55* 0.76 0.59*  GLUCOSE 163* 128* 120*    Recent Labs Lab 08/01/15 0550 08/02/15 0442 08/04/15 0428  HGB 13.8 12.9* 12.0*  HCT 41.9 39.4 36.7*  WBC 11.8* 9.6 10.8*  PLT 264 250 249   Dg Orthopantogram  08/03/2015  CLINICAL DATA:  Left-sided jaw pain, area of tooth 23. Edema in floor of mouth. EXAM: ORTHOPANTOGRAM/PANORAMIC COMPARISON:  Limited correlation made with head CT 07/31/2015. FINDINGS: Multiple teeth are absent. Many of the maxillary teeth are fragmented. Evaluation of the midline structures is limited by Panorex technique due to typical artifact in this area. There is possible lucency surrounding the left mandibular lateral incisor and cuspid. No evidence of acute fracture or dislocation. IMPRESSION: Probable periapical lucency surrounding the paramedian left mandibular teeth, suspicious for periodontal disease, suboptimally evaluated by Panorex technique due to artifact. Electronically Signed   By: Richardean Sale M.D.   On: 08/03/2015 19:07    Dg Chest Port 1 View  08/04/2015  CLINICAL DATA:  50 year old male with left-sided pleural effusion. EXAM: PORTABLE CHEST 1 VIEW COMPARISON:  Chest radiograph dated 07/30/2015 CT dated 07/31/2015 FINDINGS: There is complete opacification of left hemithorax. The right lung is clear. There is no pneumothorax. There is persistent high-grade narrowing of the left mainstem bronchus as seen on the prior study and concerning for underlying obstructive mass. There is no significant mediastinal shift. The osseous structures are grossly unremarkable. IMPRESSION: Persistent opacification of the left hemithorax with narrowing of the left mainstem bronchus. Electronically Signed   By: Anner Crete M.D.   On: 08/04/2015 07:39    ASSESSMENT / PLAN: 50 year old male smoker (30-pack-year) with history of CAD s/p PCI in 2000 and 2010, CHF, GERD, DM, HTN, asthma, OSA, depression/anxiety presenting with dyspnea to [facility] found to have large left sided pleural effusion and mediastinal adenopathy on CT chest. Seen by IR on 2/15 for left sided thoracentesis by IR -- 2L amber colored fluid removed.  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 Narrowing of left mainstem bronchus. CT head with no intracranial metastasis. CT abd/pelvis with intraabdominal metastases seen in liver, adrenal glands, and mesentery. 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 Fluid analysis exudative but culture NGTD and procalcitonin <0.1 -cytology negative  We will plan to perform FOB with possible endobronchial biopsies, EBUS to sample R paratracheal and  subcarinal nodes (and any other discernable LAD). I explained the procedure to the patient, risks and benefits. He understands and agrees to proceed.   OSA: Patient refusing to use CPAP. Will consider it if his breathing worsens.  Diastolic CHF:  Echo with LV EF 55% to 60% and grade 2 diastolic dysfunction.    Baltazar Apo, MD, PhD 08/04/2015, 10:56 AM [facility] [phone] or if no answer [phone]

## 2015-08-05 NOTE — Interval H&P Note (Signed)
PCCM Interval Note  Pt stable, ready to proceed. Understands the procedure, benefits, risks.    Recent Labs Lab 08/01/15 0550 08/02/15 0442 08/04/15 0428  HGB 13.8 12.9* 12.0*  HCT 41.9 39.4 36.7*  WBC 11.8* 9.6 10.8*  PLT 264 250 249    Recent Labs Lab 07/31/15 0520 08/01/15 0550 08/02/15 0442 08/04/15 0428 08/05/15 0515  NA 137 132* 135 133* 137  K 4.2 4.6 4.8 4.0 4.2  CL 98* 94* 96* 96* 98*  CO2 '27 24 27 28 27  '$ GLUCOSE 85 163* 128* 120* 101*  BUN '8 9 12 12 9  '$ CREATININE 0.68 0.55* 0.76 0.59* 0.62  CALCIUM 9.0 9.2 8.7* 8.4* 8.4*  MG  --  1.6* 1.7  --   --     Recent Labs Lab 07/31/15 0520 08/04/15 0428 08/05/15 0515  INR 1.23 1.61* 1.51*   Note INR 1.5  No barrier to proceeding.   Baltazar Apo, MD, PhD 08/05/2015, 7:26 AM [facility] [phone] or if no answer [phone]

## 2015-08-05 NOTE — Progress Notes (Signed)
Patient continues to refuse CPAP 

## 2015-08-05 NOTE — Transfer of Care (Signed)
Immediate Anesthesia Transfer of Care Note  Patient: Cody [name]  Procedure(s) Performed: Procedure(s): VIDEO BRONCHOSCOPY WITH ENDOBRONCHIAL ULTRASOUND with Biopsy (Left)  Patient Location: PACU  Anesthesia Type:General  Level of Consciousness: awake and alert   Airway & Oxygen Therapy: Patient Spontanous Breathing and Patient connected to face mask oxygen  Post-op Assessment: Report given to RN, Post -op Vital signs reviewed and stable and Patient moving all extremities X 4  Post vital signs: Reviewed and stable  Last Vitals:  Filed Vitals:   08/04/15 2304 08/04/15 2320  BP:  100/66  Pulse:  85  Temp: 36.8 C   Resp:  15   HR 108, RR 26, BP 045/99, sats 77%  Complications: No anesthetic complications

## 2015-08-05 NOTE — Progress Notes (Signed)
Cody [name] TEAM 1 - Stepdown/ICU TEAM Progress Note  Cody [name] TMH:[id number] DOB: April 10, 1966 DOA: 07/30/2015 PCP: Chesley Noon, MD  Admit HPI / Brief Narrative: 50 y.o. WM PMHx  TBI, ADD, Anxiety, Depression, HTN, CAD native artery, MI, DM Type2, HLD, OSA on CPAP  Presenting today ED After [facility] [place] ED with increasing shortness of breath, cough, congestion. He had been diagnosed with pneumonia 3 weeks prior, but the symptoms were not relieved with antibiotics as an outpatient. CT performed at [facility], [place] shown large left-sided pleural effusion, mediastinal adenopathy suspicious for endobronchial/hilar mass with lymph node metastases. Also seen, centered right-sided pulmonary nodules up to 3 mm. No PE was noted. Because of the lengthy wait at [facility] the patient decided to present here for further evaluation Denies fevers, chills, night sweats or mucositis. Denies hemoptysis. Denies any chest pain or palpitations. Denies lower extremity swelling. No confusion was reported. He denies any vision changes or double vision. He denies any headaches. He denies any abdominal pain,. He has been more nauseous without vomiting over the last week, without dizziness or vertigo. He has decreased appetite due to failure to thrive with unintentional 30 lbs weight loss.  At the ED, his Vitals are stable, OSATs normal. He was afebrile. CBC and CMET remarkable for K 5.4 with neg Tn at 0. He will be admitted for left throracentesis and further workup.   HPI/Subjective: 2/21 Groggy from procedure, negative N/V/SOB   Assessment/Plan:  Acute respiratory failure with hypoxia secondary to large left pleural effusion -Resolved with thoracentesis  Lung mass, likely due to malignancy  -CT angio on 2/14 2.8 x 4.7 cm lymph node conglomerate subcarinal location. Several mildly enlarged paraesophageal lymph nodes also identified. Small amount of fluid within the esophagus.  Mild left to right mediastinal shift. 1.2 cm AP window lymph nodes. Suspect SCC  -LDH elevated at 683 -PCCM consulted bronchoscopy for biopsy? Stenting of left mainstem bronchus? -CT head W/WO contrast pending -CT abdomen pelvis W contrast pending -2/21 S/P EBUS, results pending  Large Left Pleural Effusion/ -2/15 S/P left thoracentesis 2 L -Most likely malignant given parents and smoking history  -2/21 PCXR in a.m. patient will most likely require another therapeutic thoracentesis  Postobstructive Pneumonia 2dary to extrinsic pressure; intrinsic invasion? -Afebrile, negative leukocytosis hold antibiotics  Diabetes mellitus type 2 controlled -2/17 Hemoglobin A1c= 6.4  -Continue Lantus 8 units QHS -Continue moderate SSI  HLD -Lipid panel slightly outside ADA guidelines but will hold off on starting any new medication at this point, given patient most likely will require chemotherapy/XRT   Hypertension  -Coreg 3.125 mg  BID  Chronic Diastolic CHF -See hypertension   Hyperkalemia, current K at 5.4. No BL available -Resolved   Sleep Apnea  -CPAP per RT       Code Status: FULL Family Communication: no family present at time of exam Disposition Plan: Dependent upon completion of cancer workup    Consultants: Dr.Wesam Kathryne Sharper PC CM    Procedure/Significant Events: 2/14 CT Angiogram at [facility];-Occlusion of the left mainstem bronchus with relatively material. Mass effect on the left mainstem bronchus with associated narrowing from the adjacent mass lesion. -Mediastinum/hila: 2.8 x 4.7 cm lymph node conglomerate subcarinal location. Several mildly enlarged Paraesophageal lymph nodes also identified. -Mild left to right mediastinal shift. 1.2 cm AP window LN. -Heart; Minimal pericardial effusion. -Complete collapse  left lung. 3 mm nodule right upper lobe close associated with the right minor fissure. 4 x 3 mm  RML pulmonary nodule image -3 mm LLL nodule . 2/15 left  thoracentesis; 2 L amber fluid removed 2/16 CT chest with contrast; -Moderate left pleural effusion with collapse and consolidation of the left lung. -narrowing of the left mainstem bronchus likely indicating an obstructing mass lesion although the mass is obscured to visualization by the surrounding postobstructive changes and effusion.  -Mild atelectasis in the right lung base.- Probable metastatic mediastinal lymph nodes, bilateral adrenal gland nodules,and abdominal peritoneal deposits. 2/16 echocardiogram;- Left ventricle: mildconcentric hypertrophy.- LVEF= 55% to 60%. -(grade 2 diastolic dysfunction).- Pericardium, moderate-sized leftpleural effusion. 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 There is mediastinal adenopathy and malignant appearing left pleural effusion. Intra-abdominal metastases seen in the liver, adrenal glands, and mesentery. 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 2/16 CT head W/WO contrast;No CT findings of intracranial metastasis though, MRI with contrast is more sensitive. 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 2/21S/P EBUS  Culture 2/15 left pleural fluid pending 2/15 left pleural fluid cytology; nondiagnostic 2/21 EBUS results pending  Antibiotics: NA  DVT prophylaxis: Subcutaneous heparin   Devices NA  LINES / TUBES:  NA    Continuous Infusions: . sodium chloride 50 mL/hr at 08/05/15 1700    Objective: VITAL SIGNS: Temp: 99.4 F (37.4 C) (02/21 1933) Temp Source: Oral (02/21 1933) BP: 109/66 mmHg (02/21 1449) Pulse Rate: 88 (02/21 1449) SPO2; FIO2:   Intake/Output Summary (Last 24 hours) at 08/05/15 2105 Last data filed at 08/05/15 1933  Gross per 24 hour  Intake   2710 ml  Output    600 ml  Net   2110 ml     Exam: General:  Groggy from procedure, negative N/V/SOB, No acute respiratory distress at  rest Eyes: Negative headache, negative scleral hemorrhage ENT: Negative Runny nose, negative gingival bleeding, Neck:  Negative scars, masses, torticollis, lymphadenopathy, JVD Lungs: seriously diminished LUL breath sounds, absent breath sounds lingula and LLL, RUL/RML/RLL mild expiratory wheezing, negative crackles Cardiovascular: Regular rate and rhythm without murmur gallop or rub normal S1 and S2 Abdomen:negative abdominal pain, nondistended, positive soft, bowel sounds, no rebound, no ascites, no appreciable mass Extremities: No significant cyanosis, clubbing, or edema bilateral lower extremities Psychiatric:  Negative depression, negative anxiety, negative fatigue, negative mania  Neurologic:  Cranial nerves II through XII intact, tongue/uvula midline, all extremities muscle strength 5/5, sensation intact throughout, negative dysarthria, negative expressive aphasia, negative receptive aphasia.   Data Reviewed: Basic Metabolic Panel:  Recent Labs Lab 07/31/15 0520 08/01/15 0550 08/02/15 0442 08/04/15 0428 08/05/15 0515  NA 137 132* 135 133* 137  K 4.2 4.6 4.8 4.0 4.2  CL 98* 94* 96* 96* 98*  CO2 '27 24 27 28 27  '$ GLUCOSE 85 163* 128* 120* 101*  BUN '8 9 12 12 9  '$ CREATININE 0.68 0.55* 0.76 0.59* 0.62  CALCIUM 9.0 9.2 8.7* 8.4* 8.4*  MG  --  1.6* 1.7  --   --    Liver Function Tests:  Recent Labs Lab 08/01/15 0550 08/02/15 0442 08/05/15 0515  AST 44* 45* 37  ALT 16* 18 22  ALKPHOS 114 150* 134*  BILITOT 1.4* 0.6 0.5  PROT 6.8 5.6* 5.7*  ALBUMIN 2.9* 2.4* 2.3*   No results for input(s): LIPASE, AMYLASE in the last 168 hours. No results for input(s): AMMONIA in the last 168 hours. CBC:  Recent Labs Lab 07/30/15 1153 07/31/15 0520 08/01/15 0550 08/02/15 0442 08/04/15 0428  WBC 9.6 10.5 11.8* 9.6 10.8*  NEUTROABS 6.8  --  9.2*  --   --   HGB 12.7* 13.9 13.8 12.9* 12.0*  HCT 39.2 42.7 41.9 39.4 36.7*  MCV 92.2 92.0 91.5 91.4 90.8  PLT 246 270 264 250 249    Cardiac Enzymes: No results for input(s): CKTOTAL, CKMB, CKMBINDEX, TROPONINI in the last 168 hours. BNP (last 3 results) No results for input(s): BNP in the last 8760 hours.  ProBNP (last 3 results) No results for input(s): PROBNP in the last 8760 hours.  CBG:  Recent Labs Lab 08/04/15 1150 08/04/15 1647 08/04/15 2111 08/05/15 0919 08/05/15 1743  GLUCAP 213* 229* 100* 107* 172*    Recent Results (from the past 240 hour(s))  Culture, body fluid-bottle     Status: None   Collection Time: 07/30/15  2:23 PM  Result Value Ref Range Status   Specimen Description FLUID PLEURAL LEFT  Final   Special Requests BOTTLES DRAWN AEROBIC AND ANAEROBIC 10CC  Final   Culture NO GROWTH 5 DAYS  Final   Report Status 08/04/2015 FINAL  Final  Gram stain     Status: None   Collection Time: 07/30/15  2:23 PM  Result Value Ref Range Status   Specimen Description FLUID PLEURAL LEFT  Final   Special Requests NONE  Final   Gram Stain   Final    CYTOSPIN SMEAR WBC PRESENT,BOTH PMN AND MONONUCLEAR NO ORGANISMS SEEN    Report Status 07/30/2015 FINAL  Final  MRSA PCR Screening     Status: None   Collection Time: 07/30/15  7:18 PM  Result Value Ref Range Status   MRSA by PCR NEGATIVE NEGATIVE Final    Comment:        The GeneXpert MRSA Assay (FDA approved for NASAL specimens only), is one component of a comprehensive MRSA colonization surveillance program. It is not intended to diagnose MRSA infection nor to guide or monitor treatment for MRSA infections.      Studies:  Recent x-ray studies have been reviewed in detail by the Attending Physician  Scheduled Meds:  Scheduled Meds: . arformoterol  15 mcg Nebulization BID  . aspirin  81 mg Oral Daily  . atorvastatin  40 mg Oral QPM  . budesonide (PULMICORT) nebulizer solution  0.5 mg Nebulization BID  . carvedilol  3.125 mg Oral BID WC  . citalopram  40 mg Oral QPM  . clindamycin  300 mg Oral 3 times per day  . [START ON  08/06/2015] enoxaparin (LOVENOX) injection  40 mg Subcutaneous Q24H  . feeding supplement (ENSURE ENLIVE)  237 mL Oral BID BM  . fentaNYL      . insulin aspart  0-15 Units Subcutaneous TID WC  . insulin glargine  8 Units Subcutaneous QHS  . ipratropium-albuterol  3 mL Nebulization TID  . magnesium sulfate 1 - 4 g bolus IVPB  2 g Intravenous Once  . pantoprazole  40 mg Oral Daily  . phytonadione  5 mg Oral Daily    Time spent on care of this patient: 40 mins   Maddi Collar, Geraldo Docker , MD  [facility] Office  [phone] Pager [phone]  On-Call/Text Page:      [email or web address]      password [email or web address]  If 7PM-7AM, please contact night-coverage [email or web address] Password [email or web address] 08/05/2015, 9:05 PM   LOS: 6 days   Care during the described time interval was provided by me .  I have reviewed this patient's available data, including medical history, events of note, physical examination, and all  test results as part of my evaluation. I have personally reviewed and interpreted all radiology studies.   Dia Crawford, MD [phone] Pager

## 2015-08-06 ENCOUNTER — Encounter (HOSPITAL_COMMUNITY): Payer: Self-pay | Admitting: Emergency Medicine

## 2015-08-06 ENCOUNTER — Inpatient Hospital Stay (HOSPITAL_COMMUNITY): Payer: Medicare HMO

## 2015-08-06 DIAGNOSIS — I5032 Chronic diastolic (congestive) heart failure: Secondary | ICD-10-CM | POA: Diagnosis present

## 2015-08-06 DIAGNOSIS — G4733 Obstructive sleep apnea (adult) (pediatric): Secondary | ICD-10-CM | POA: Diagnosis present

## 2015-08-06 DIAGNOSIS — Z9989 Dependence on other enabling machines and devices: Secondary | ICD-10-CM

## 2015-08-06 DIAGNOSIS — E785 Hyperlipidemia, unspecified: Secondary | ICD-10-CM | POA: Diagnosis present

## 2015-08-06 LAB — GLUCOSE, CAPILLARY
GLUCOSE-CAPILLARY: 111 mg/dL — AB (ref 65–99)
GLUCOSE-CAPILLARY: 205 mg/dL — AB (ref 65–99)
Glucose-Capillary: 213 mg/dL — ABNORMAL HIGH (ref 65–99)
Glucose-Capillary: 223 mg/dL — ABNORMAL HIGH (ref 65–99)

## 2015-08-06 MED ORDER — IPRATROPIUM-ALBUTEROL 0.5-2.5 (3) MG/3ML IN SOLN: 3.0000 mL | Freq: Four times a day (QID) | RESPIRATORY_TRACT | Status: DC | PRN

## 2015-08-06 NOTE — Progress Notes (Signed)
Patient continues to refuse CPAP 

## 2015-08-06 NOTE — Progress Notes (Signed)
[place] TEAM 1 - Stepdown/ICU TEAM Progress Note  Cody [name] QAS:[id number] DOB: 17-Jun-1965 DOA: 07/30/2015 PCP: Chesley Noon, MD  Admit HPI / Brief Narrative: 50 y.o. WM PMHx  TBI, ADD, Anxiety, Depression, HTN, CAD native artery, MI, DM Type2, HLD, OSA on CPAP  Presenting today ED After presenting to [facility] in [place] ED with increasing shortness of breath, cough, congestion. He had been diagnosed with pneumonia 3 weeks prior, but the symptoms were not relieved with antibiotics as an outpatient. CT performed at [facility] [place] shown large left-sided pleural effusion, mediastinal adenopathy suspicious for endobronchial/hilar mass with lymph node metastases. Also seen, centered right-sided pulmonary nodules up to 3 mm. No PE was noted. Because of the lengthy wait at [facility] the patient decided to present here for further evaluation Denies fevers, chills, night sweats or mucositis. Denies hemoptysis. Denies any chest pain or palpitations. Denies lower extremity swelling. No confusion was reported. He denies any vision changes or double vision. He denies any headaches. He denies any abdominal pain,. He has been more nauseous without vomiting over the last week, without dizziness or vertigo. He has decreased appetite due to failure to thrive with unintentional 30 lbs weight loss.  At the ED, his Vitals are stable, OSATs normal. He was afebrile. CBC and CMET remarkable for K 5.4 with neg Tn at 0. He will be admitted for left throracentesis and further workup.   HPI/Subjective: 2/22  A/ O 4,, negative increased N/V/SOB   Assessment/Plan:  Acute respiratory failure with hypoxia secondary to large left pleural effusion -Resolved with thoracentesis  Lung mass, likely due to malignancy  -CT angio on 2/14 2.8 x 4.7 cm lymph node conglomerate subcarinal location. Several mildly enlarged paraesophageal lymph nodes also identified. Small amount of fluid within the esophagus.  Mild left to right mediastinal shift. 1.2 cm AP window lymph nodes. Suspect SCC  -LDH elevated at 683 -PCCM consulted bronchoscopy for biopsy? Stenting of left mainstem bronchus? -CT head W/WO contrast pending -CT abdomen pelvis W contrast pending -2/21 S/P EBUS, pathology results pending  Large Left Pleural Effusion/ -2/15 S/P left thoracentesis 2 L -Most likely malignant given parents and smoking history  -2/21 PCXR in a.m. patient will most likely require another therapeutic thoracentesis; continues to show opacification left hemithorax, however since patient asymptomatic we will just continue to monitor.  Postobstructive Pneumonia 2dary to extrinsic pressure; intrinsic invasion? -Afebrile, negative leukocytosis hold antibiotics  Diabetes mellitus type 2 controlled with complications -7/98 Hemoglobin A1c= 6.4  -Continue Lantus 8 units QHS -Continue moderate SSI  HLD -Lipid panel slightly outside ADA guidelines but will hold off on starting any new medication at this point, given patient most likely will require chemotherapy/XRT  Hypertension  -Coreg 3.125 mg  BID  Chronic Diastolic CHF -See hypertension   Hyperkalemia, -Resolved   Sleep Apnea  -CPAP per RT       Code Status: FULL Family Communication: no family present at time of exam Disposition Plan: Dependent upon completion of cancer workup    Consultants: Dr.Wesam Kathryne Sharper PC CM    Procedure/Significant Events: 2/14 CT Angiogram at [facility];-Occlusion of the left mainstem bronchus with relatively material. Mass effect on the left mainstem bronchus with associated narrowing from the adjacent mass lesion. -Mediastinum/hila: 2.8 x 4.7 cm lymph node conglomerate subcarinal location. Several mildly enlarged Paraesophageal lymph nodes also identified. -Mild left to right mediastinal shift. 1.2 cm AP window LN. -Heart; Minimal pericardial effusion. -Complete collapse  left lung. 3 mm nodule right  upper lobe  close associated with the right minor fissure. 4 x 3 mm RML pulmonary nodule image -3 mm LLL nodule . 2/15 left thoracentesis; 2 L amber fluid removed 2/16 CT chest with contrast; -Moderate left pleural effusion with collapse and consolidation of the left lung. -narrowing of the left mainstem bronchus likely indicating an obstructing mass lesion although the mass is obscured to visualization by the surrounding postobstructive changes and effusion.  -Mild atelectasis in the right lung base.- Probable metastatic mediastinal lymph nodes, bilateral adrenal gland nodules,and abdominal peritoneal deposits. 2/16 echocardiogram;- Left ventricle: mildconcentric hypertrophy.- LVEF= 55% to 60%. -(grade 2 diastolic dysfunction).- Pericardium, moderate-sized leftpleural effusion. 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 There is mediastinal adenopathy and malignant appearing left pleural effusion. Intra-abdominal metastases seen in the liver, adrenal glands, and mesentery. 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 2/16 CT head W/WO contrast;No CT findings of intracranial metastasis though, MRI with contrast is more sensitive. 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 2/21S/P EBUS 2/22 PCXR; continued opacification left hemithorax   Culture 2/15 left pleural fluid pending 2/15 left pleural fluid cytology; nondiagnostic 2/21 EBUS results pending  Antibiotics: NA  DVT prophylaxis: Subcutaneous heparin   Devices NA  LINES / TUBES:  NA    Continuous Infusions: . sodium chloride 50 mL/hr at 08/06/15 0600    Objective: VITAL SIGNS: Temp: 98.1 F (36.7 C) (02/22 1151) Temp Source: Oral (02/22 1151) BP: 120/84 mmHg (02/22 1150) Pulse Rate: 94 (02/22 1150) SPO2; FIO2:   Intake/Output Summary (Last 24 hours) at 08/06/15 1208 Last data filed at 08/06/15 1151  Gross  per 24 hour  Intake   1990 ml  Output   1675 ml  Net    315 ml     Exam: General:  A/ O 4,, negative increased N/V/SOB, No acute respiratory distress at rest Eyes: Negative headache, negative scleral hemorrhage ENT: Negative Runny nose, negative gingival bleeding, Neck:  Negative scars, masses, torticollis, lymphadenopathy, JVD Lungs: absent LUL breath sounds/lingula and LLL, RUL/RML/RLL mild expiratory wheezing, negative crackles Cardiovascular: Regular rate and rhythm without murmur gallop or rub normal S1 and S2 Abdomen:negative abdominal pain, nondistended, positive soft, bowel sounds, no rebound, no ascites, no appreciable mass Extremities: No significant cyanosis, clubbing, or edema bilateral lower extremities Psychiatric:  Negative depression, negative anxiety, negative fatigue, negative mania  Neurologic:  Cranial nerves II through XII intact, tongue/uvula midline, all extremities muscle strength 5/5, sensation intact throughout, negative dysarthria, negative expressive aphasia, negative receptive aphasia.   Data Reviewed: Basic Metabolic Panel:  Recent Labs Lab 07/31/15 0520 08/01/15 0550 08/02/15 0442 08/04/15 0428 08/05/15 0515  NA 137 132* 135 133* 137  K 4.2 4.6 4.8 4.0 4.2  CL 98* 94* 96* 96* 98*  CO2 '27 24 27 28 27  '$ GLUCOSE 85 163* 128* 120* 101*  BUN '8 9 12 12 9  '$ CREATININE 0.68 0.55* 0.76 0.59* 0.62  CALCIUM 9.0 9.2 8.7* 8.4* 8.4*  MG  --  1.6* 1.7  --   --    Liver Function Tests:  Recent Labs Lab 08/01/15 0550 08/02/15 0442 08/05/15 0515  AST 44* 45* 37  ALT 16* 18 22  ALKPHOS 114 150* 134*  BILITOT 1.4* 0.6 0.5  PROT 6.8 5.6* 5.7*  ALBUMIN 2.9* 2.4* 2.3*   No results for input(s): LIPASE, AMYLASE in the last 168 hours. No results for input(s): AMMONIA in the last 168 hours. CBC:  Recent Labs Lab 07/31/15  8242 08/01/15 0550 08/02/15 0442 08/04/15 0428  WBC 10.5 11.8* 9.6 10.8*  NEUTROABS  --  9.2*  --   --   HGB 13.9 13.8 12.9*  12.0*  HCT 42.7 41.9 39.4 36.7*  MCV 92.0 91.5 91.4 90.8  PLT 270 264 250 249   Cardiac Enzymes: No results for input(s): CKTOTAL, CKMB, CKMBINDEX, TROPONINI in the last 168 hours. BNP (last 3 results) No results for input(s): BNP in the last 8760 hours.  ProBNP (last 3 results) No results for input(s): PROBNP in the last 8760 hours.  CBG:  Recent Labs Lab 08/04/15 2111 08/05/15 0919 08/05/15 1743 08/05/15 2124 08/06/15 0817  GLUCAP 100* 107* 172* 158* 111*    Recent Results (from the past 240 hour(s))  Culture, body fluid-bottle     Status: None   Collection Time: 07/30/15  2:23 PM  Result Value Ref Range Status   Specimen Description FLUID PLEURAL LEFT  Final   Special Requests BOTTLES DRAWN AEROBIC AND ANAEROBIC 10CC  Final   Culture NO GROWTH 5 DAYS  Final   Report Status 08/04/2015 FINAL  Final  Gram stain     Status: None   Collection Time: 07/30/15  2:23 PM  Result Value Ref Range Status   Specimen Description FLUID PLEURAL LEFT  Final   Special Requests NONE  Final   Gram Stain   Final    CYTOSPIN SMEAR WBC PRESENT,BOTH PMN AND MONONUCLEAR NO ORGANISMS SEEN    Report Status 07/30/2015 FINAL  Final  MRSA PCR Screening     Status: None   Collection Time: 07/30/15  7:18 PM  Result Value Ref Range Status   MRSA by PCR NEGATIVE NEGATIVE Final    Comment:        The GeneXpert MRSA Assay (FDA approved for NASAL specimens only), is one component of a comprehensive MRSA colonization surveillance program. It is not intended to diagnose MRSA infection nor to guide or monitor treatment for MRSA infections.      Studies:  Recent x-ray studies have been reviewed in detail by the Attending Physician  Scheduled Meds:  Scheduled Meds: . arformoterol  15 mcg Nebulization BID  . aspirin  81 mg Oral Daily  . atorvastatin  40 mg Oral QPM  . budesonide (PULMICORT) nebulizer solution  0.5 mg Nebulization BID  . carvedilol  3.125 mg Oral BID WC  . citalopram   40 mg Oral QPM  . clindamycin  300 mg Oral 3 times per day  . enoxaparin (LOVENOX) injection  40 mg Subcutaneous Q24H  . feeding supplement (ENSURE ENLIVE)  237 mL Oral BID BM  . insulin aspart  0-15 Units Subcutaneous TID WC  . insulin glargine  8 Units Subcutaneous QHS  . ipratropium-albuterol  3 mL Nebulization TID  . magnesium sulfate 1 - 4 g bolus IVPB  2 g Intravenous Once  . pantoprazole  40 mg Oral Daily  . phytonadione  5 mg Oral Daily    Time spent on care of this patient: 40 mins   Jahlia Omura, Geraldo Docker , MD  [facility] Office  [phone] Pager [phone]  On-Call/Text Page:      [email or web address]      password [email or web address]  If 7PM-7AM, please contact night-coverage [email or web address] Password [email or web address] 08/06/2015, 12:08 PM   LOS: 7 days   Care during the described time interval was provided by me .  I have reviewed this patient's available data, including medical history, events of note, physical examination, and all test  results as part of my evaluation. I have personally reviewed and interpreted all radiology studies.   Dia Crawford, MD [phone] Pager

## 2015-08-07 DIAGNOSIS — I5032 Chronic diastolic (congestive) heart failure: Secondary | ICD-10-CM

## 2015-08-07 DIAGNOSIS — S098XXS Other specified injuries of head, sequela: Secondary | ICD-10-CM

## 2015-08-07 DIAGNOSIS — J984 Other disorders of lung: Secondary | ICD-10-CM

## 2015-08-07 DIAGNOSIS — J9819 Other pulmonary collapse: Secondary | ICD-10-CM

## 2015-08-07 DIAGNOSIS — C7970 Secondary malignant neoplasm of unspecified adrenal gland: Secondary | ICD-10-CM

## 2015-08-07 DIAGNOSIS — C787 Secondary malignant neoplasm of liver and intrahepatic bile duct: Secondary | ICD-10-CM

## 2015-08-07 DIAGNOSIS — C3492 Malignant neoplasm of unspecified part of left bronchus or lung: Secondary | ICD-10-CM

## 2015-08-07 DIAGNOSIS — E118 Type 2 diabetes mellitus with unspecified complications: Secondary | ICD-10-CM

## 2015-08-07 DIAGNOSIS — R0600 Dyspnea, unspecified: Secondary | ICD-10-CM

## 2015-08-07 DIAGNOSIS — R6884 Jaw pain: Secondary | ICD-10-CM

## 2015-08-07 DIAGNOSIS — C7951 Secondary malignant neoplasm of bone: Secondary | ICD-10-CM

## 2015-08-07 LAB — COMPREHENSIVE METABOLIC PANEL
ALT: 22 U/L (ref 17–63)
AST: 34 U/L (ref 15–41)
Albumin: 2.3 g/dL — ABNORMAL LOW (ref 3.5–5.0)
Alkaline Phosphatase: 118 U/L (ref 38–126)
Anion gap: 11 (ref 5–15)
BUN: 9 mg/dL (ref 6–20)
CHLORIDE: 98 mmol/L — AB (ref 101–111)
CO2: 26 mmol/L (ref 22–32)
CREATININE: 0.56 mg/dL — AB (ref 0.61–1.24)
Calcium: 8.6 mg/dL — ABNORMAL LOW (ref 8.9–10.3)
GFR calc Af Amer: >60 mL/min (ref >60)
GFR calc non Af Amer: >60 mL/min (ref >60)
Glucose, Bld: 141 mg/dL — ABNORMAL HIGH (ref 65–99)
POTASSIUM: 4.6 mmol/L (ref 3.5–5.1)
SODIUM: 135 mmol/L (ref 135–145)
Total Bilirubin: 0.8 mg/dL (ref 0.3–1.2)
Total Protein: 6.3 g/dL — ABNORMAL LOW (ref 6.5–8.1)

## 2015-08-07 LAB — GLUCOSE, CAPILLARY
GLUCOSE-CAPILLARY: 225 mg/dL — AB (ref 65–99)
GLUCOSE-CAPILLARY: 145 mg/dL — AB (ref 65–99)
Glucose-Capillary: 121 mg/dL — ABNORMAL HIGH (ref 65–99)
Glucose-Capillary: 185 mg/dL — ABNORMAL HIGH (ref 65–99)

## 2015-08-07 LAB — CBC WITH DIFFERENTIAL/PLATELET
Basophils Absolute: 0 10*3/uL (ref 0.0–0.1)
Basophils Relative: 0 %
EOS ABS: 0.1 10*3/uL (ref 0.0–0.7)
EOS PCT: 1 %
HCT: 38.3 % — ABNORMAL LOW (ref 39.0–52.0)
HEMOGLOBIN: 12.3 g/dL — AB (ref 13.0–17.0)
LYMPHS ABS: 1.4 10*3/uL (ref 0.7–4.0)
LYMPHS PCT: 12 %
MCH: 29.1 pg (ref 26.0–34.0)
MCHC: 32.1 g/dL (ref 30.0–36.0)
MCV: 90.5 fL (ref 78.0–100.0)
MONOS PCT: 10 %
Monocytes Absolute: 1.2 10*3/uL — ABNORMAL HIGH (ref 0.1–1.0)
NEUTROS PCT: 77 %
Neutro Abs: 9.2 10*3/uL — ABNORMAL HIGH (ref 1.7–7.7)
Platelets: 285 10*3/uL (ref 150–400)
RBC: 4.23 MIL/uL (ref 4.22–5.81)
RDW: 14.8 % (ref 11.5–15.5)
WBC: 12 10*3/uL — ABNORMAL HIGH (ref 4.0–10.5)

## 2015-08-07 LAB — MAGNESIUM: MAGNESIUM: 1.7 mg/dL (ref 1.7–2.4)

## 2015-08-07 MED ORDER — ENSURE ENLIVE PO LIQD
237.0000 mL | Freq: Three times a day (TID) | ORAL | Status: DC
  Administered 2015-08-07 – 2015-08-08 (×3): 237 mL via ORAL

## 2015-08-07 MED ORDER — INSULIN GLARGINE 100 UNIT/ML ~~LOC~~ SOLN
12.0000 [IU] | Freq: Every day | SUBCUTANEOUS | Status: DC
  Administered 2015-08-07: 12 [IU] via SUBCUTANEOUS
  Filled 2015-08-07 (×2): qty 0.12

## 2015-08-07 NOTE — Progress Notes (Signed)
Name: Cody [name] MRN: [id number] DOB: 10-23-1965    ADMISSION DATE:  07/30/2015 CONSULTATION DATE:  07/31/2015  REFERRING MD:  Sherral Hammers  CHIEF COMPLAINT:  SOB  SUBJECTIVE:   S/p EBUS with brushings and biopsies 02/23.  Pathology returned positive for squamous cell. Pt eager to see oncologist to discuss treatment options.  Wife and daughter at bedside.  VITAL SIGNS: Temp:  [97.5 F (36.4 C)-98.7 F (37.1 C)] 98.1 F (36.7 C) (02/23 0810) Pulse Rate:  [78-94] 82 (02/23 0355) Resp:  [15-22] 22 (02/23 0355) BP: (105-120)/(70-86) 108/78 mmHg (02/23 0355) SpO2:  [92 %-96 %] 96 % (02/23 0846) Weight:  [100.3 kg (221 lb 1.9 oz)] 100.3 kg (221 lb 1.9 oz) (02/23 0355)  PHYSICAL EXAMINATION: General:  Middle aged male, visiting with family, in NAD. Neuro:  A&O x 3, no gross deficits. HEENT:  [place]/AT, PERRL, MMM. Cardiovascular:  RRR. Lungs:  Decreased BS on L throughout. Faint wheezes on right. Abdomen:  Soft, obese. Musculoskeletal:  No edema. Skin:  Warm and well perfused.   Recent Labs Lab 08/04/15 0428 08/05/15 0515 08/07/15 0509  NA 133* 137 135  K 4.0 4.2 4.6  CL 96* 98* 98*  CO2 '28 27 26  '$ BUN '12 9 9  '$ CREATININE 0.59* 0.62 0.56*  GLUCOSE 120* 101* 141*    Recent Labs Lab 08/02/15 0442 08/04/15 0428 08/07/15 0509  HGB 12.9* 12.0* 12.3*  HCT 39.4 36.7* 38.3*  WBC 9.6 10.8* 12.0*  PLT 250 249 285   Dg Chest Port 1 View  08/06/2015  CLINICAL DATA:  Pleural effusion. EXAM: PORTABLE CHEST 1 VIEW COMPARISON:  08/04/2015.  CT 07/31/2015 FINDINGS: Narrowing of the left mainstem bronchus again noted. Opacification of the left hemithorax is noted. Mild right base subsegmental atelectasis. Heart size difficult to assess due to opacification of the left hemi thorax. No pneumothorax. IMPRESSION: Persistent opacification of the left hemi thorax with narrowing of the left mainstem bronchus. Reference is made to prior CT report 07/31/2015. Electronically Signed   By: Marcello Moores   Register   On: 08/06/2015 07:42   SIGNIFICANT EVENTS / STUDIES: 02/22 > underwent EBUS with brushings and biopsies (wang bx from 2R node and 7 node, endobronchial brushings and biopsies from LLL). 02/22 Pathology > Squamous cell carcinoma.  ASSESSMENT / PLAN: 50 year old male smoker (30-pack-year) with history of CAD s/p PCI in 2000 and 2010, CHF, GERD, DM, HTN, asthma, OSA, depression/anxiety presenting with dyspnea to [facility] found to have large left sided pleural effusion and mediastinal adenopathy on CT chest. Seen by IR on 2/15 for left sided thoracentesis by IR -- 2L amber colored fluid removed, exudative in nature with negative cytology. He had EBUS with brushings and biopsies performed 02/22 (Dr. Lamonte Sakai).  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 Narrowing of left mainstem bronchus. CT head with no intracranial metastasis. CT abd/pelvis with intraabdominal metastases seen in liver, adrenal glands, and mesentery. 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 Fluid analysis exudative but culture NGTD and procalcitonin <0 and cytology negative. FOB with EBUS performed 08/06/15 by Dr. Lamonte Sakai from which pathology returned positive for squamous cell carcinoma. Plan: Discussed results with pt and informed him that we will consult oncology for further recs regarding treatment.  OSA: Patient refusing to use CPAP. Will consider it if his breathing worsens.  Rest  per primary team.   Montey Hora, [facility] Pager: [phone]  or [phone] 08/07/2015, 10:52 AM  STAFF NOTE: I, Merrie Roof, MD FACP have personally reviewed patient's available data, including medical history, events of note, physical  examination and test results as part of my evaluation. I have discussed with resident/NP and other care providers such as pharmacist, RN and RRT. In addition, I personally evaluated patient and elicited key findings of: no distress, reduced left BS, agoph left apical, pcxr last done collapse left, no sig distress, speaks full sentences, reviewed all films, ct and oncology caled and reviewed, progosis poor, for possible palliative chemo, concern is may need pall radiation to left main and left bronchus, if fevers would treat post obstructive PNA, will need ambulatory pulse ox pre dc, I have had extensive discussions with pt We discussed patients current circumstances and organ failures. We also discussed patient's prior wishes under circumstances such as this. Family has decided to NOT perform resuscitation if arrest but to continue current medical support for now. Have written DNR, if on vent he wold not come off Will sign off, call if needed   Lavon Paganini. Titus Mould, MD, [place] Pgr: [place] [facility] 08/07/2015 2:29 PM

## 2015-08-07 NOTE — Progress Notes (Signed)
Occupational Therapy Treatment Patient Details Name: Cody [name] MRN: [id number] DOB: 03-07-1966 Today's Date: 08/07/2015    History of present illness 50yo M HxTBI, ADD, Anxiety, Depression, HTN, CAD w/ MI, DM2, HLD, and OSA on CPAP who presented to [facility] with increasing shortness of breath, cough, and congestion. 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 CT abdomen pelvis noted mets in liver, adrenal glands, and mesentery, as well as L femur subthrochanteric mets to the medullary cavity    OT comments  Pt instructed in energy conservation techniques, and options for shower seat.  He verbalizes understanding.  He is eager to discharge home.   Follow Up Recommendations  No OT follow up;Supervision - Intermittent    Equipment Recommendations  None recommended by OT    Recommendations for Other Services      Precautions / Restrictions Precautions Precautions: Other (comment) (monitor 02 sats )       Mobility Bed Mobility Overal bed mobility: Modified Independent                Transfers Overall transfer level: Independent                    Balance Overall balance assessment: No apparent balance deficits (not formally assessed)                                 ADL Overall ADL's : Modified independent                                       General ADL Comments: Pt with DOE 1/4.   Discussed energy conservation techniques with pt and he verbalized understanding.  Also discussed use of 3in1 as shower seat as he has one at home.  Pt reports he is very eager to return home and feels lke he is functioning better than he did PTA.  Discussed chemo related fatigue and that he may need to pace self if he chooses chemo, and/or that he may have good and bad days.       Vision                     Perception     Praxis      Cognition   Behavior During Therapy: Flat  affect Overall Cognitive Status: Within Functional Limits for tasks assessed                       Extremity/Trunk Assessment               Exercises     Shoulder Instructions       General Comments      Pertinent Vitals/ Pain       Pain Assessment: Faces Faces Pain Scale: No hurt  Home Living                                          Prior Functioning/Environment              Frequency Min 2X/week     Progress Toward Goals  OT Goals(current goals can now be found in the care plan section)  Progress towards OT goals:  Progressing toward goals  ADL Goals Additional ADL Goal #1: Pt will verbalize understadning of 3 energy conservation techniques for ADL  Additional ADL Goal #2: Further assess use of 3 in1 for bathing/tub transfer  Plan Discharge plan remains appropriate;Equipment recommendations need to be updated    Co-evaluation                 End of Session     Activity Tolerance Patient tolerated treatment well   Patient Left in bed;with call bell/phone within reach;with bed alarm set   Nurse Communication Mobility status        Time: 1146-4314 OT Time Calculation (min): 24 min  Charges: OT General Charges $OT Visit: 1 Procedure OT Treatments $Self Care/Home Management : 8-22 mins $Therapeutic Activity: 8-22 mins  Lorae Roig M 08/07/2015, 5:35 PM

## 2015-08-07 NOTE — Progress Notes (Signed)
Nutrition Follow-up  DOCUMENTATION CODES:   Severe malnutrition in context of acute illness/injury, Obesity unspecified  INTERVENTION:    Continue Ensure Enlive TID, each supplement provides 350 kcal and 20 grams of protein  Add Mighty Shakes TID with meals, each supplement provides 500 calories, 23 gm protein.  NUTRITION DIAGNOSIS:   Malnutrition related to acute illness as evidenced by moderate depletions of muscle mass, percent weight loss (14% weight loss within 6 months).  Ongoing  GOAL:   Patient will meet greater than or equal to 90% of their needs  Unmet  MONITOR:   PO intake, Supplement acceptance, Labs, Weight trends, I & O's  ASSESSMENT:   50 y.o. male with a Past Medical History of HTN, HLD, CAD status post coronary catheterization and stent placement, GERD, DM, ADD, OSA who presents with acute respiratory failure with O2 sats as low as 86% likely secondary to massive left lung effusion likely secondary to malignancy.  Patient reports poor intake over the past week. He has a poor appetite, but has been taking liquids well. He drinks Ensure Enlive supplements TID. Wife is concerned about patient's poor oral intake. Will add Mighty Shakes to meal trays.  Diet Order:  Diet heart healthy/carb modified Room service appropriate?: Yes; Fluid consistency:: Thin  Skin:  Reviewed, no issues  Last BM:  2/21  Height:   Ht Readings from Last 1 Encounters:  08/07/15 '5\' 8"'$  (1.727 m)    Weight:   Wt Readings from Last 1 Encounters:  08/07/15 221 lb 1.9 oz (100.3 kg)    Ideal Body Weight:  70 kg  BMI:  Body mass index is 33.63 kg/(m^2).  Estimated Nutritional Needs:   Kcal:  2200-2400  Protein:  110-130 gm  Fluid:  2.2-2.4 L  EDUCATION NEEDS:   No education needs identified at this time  Molli Barrows, [facility], [place], [place] Pager [phone] After Hours Pager [phone]

## 2015-08-07 NOTE — Progress Notes (Signed)
Pt. Refused cpap. Pt. States he does not wear cpap and has not worn cpap in a while. Pt. States he does not like cpaps.

## 2015-08-07 NOTE — Progress Notes (Signed)
Inpatient Diabetes Program Recommendations  AACE/ADA: New Consensus Statement on Inpatient Glycemic Control (2015)  Target Ranges:  Prepandial:   less than 140 mg/dL      Peak postprandial:   less than 180 mg/dL (1-2 hours)      Critically ill patients:  140 - 180 mg/dL   Results for SAMUELE, STOREY (MRN [id number]) as of 08/07/2015 09:13  Ref. Range 08/06/2015 08:17 08/06/2015 11:50 08/06/2015 16:58 08/06/2015 21:04 08/07/2015 08:08  Glucose-Capillary Latest Ref Range: 65-99 mg/dL 111 (H) 213 (H) 223 (H) 205 (H) 121 (H)   Review of Glycemic Control  Diabetes history: DM 2 Outpatient Diabetes medications: Glipizide 10 mg BID, Metformin 1,000 mg BID, Januvia 100 Daily Current orders for Inpatient glycemic control: Novolog Sensitive TID  Inpatient Diabetes Program Recommendations: Oral Agents: May want to consider changing Ensure to Glucerna. It has half the amount of Carbohydrate in it and glucose is currently spiking into the 200's with the Ensure. Consider adding HS scale as glucose was >200 last night.  Thanks,  Tama Headings RN, MSN, [facility] Inpatient Diabetes Coordinator Team Pager [phone] (8a-5p)

## 2015-08-07 NOTE — Consult Note (Signed)
Reason for Referral: Lung cancer.   HPI: 50 year old gentleman currently of [place] where he lived the majority of his life. He is currently on disability and does have multiple comorbid conditions include hypertension, traumatic head injury and heavy tobacco use.He presented  to the emergency department on 07/30/2015 with shortness of breath, weakness and pneumonia that did not resolve. He had a CT scan of the chest at that time which showed left-sided pleural effusion as well as mediastinal adenopathy. There is also a right-sided lung nodules and suspicious finding for endobronchial hilar mass and lymph node metastasis. He also endorsed 25 per weight loss and increased fatigue. He subsequently was hospitalized and underwent thoracentesis on 07/30/2015 and exudative fluid noted for a total of 2 L. Cytology was negative. He subsequently underwent EBUS on 08/05/2015 and the pathology showed squamous cell carcinoma. Repeat CT scan of the chest on 07/31/2015 showed moderate left effusion with collapse of the left lung and narrowing of the left mainstem bronchus. CT scan of the head showed no intracranial metastasis. CT scan of the abdomen and pelvis showed liver lesions as well as adrenal metastasis. Left femur subtrochanteric metastasis was also noted.  Clinically, he is respiratory status is improving. He is still requiring nebulizer treatments but oxygen saturation was around 90%. He appears comfortable at rest. He does not report any other bone pain at this time. Does not report any chest pain. Does not report any pelvic discomfort. He did report to the left jaw pain and discomfort.  He does not report any headaches, blurry vision, syncope or seizures. He does not report any fevers, chills or sweats. Does not report any chest pain, palpitation but does report leg edema. He does not report any wheezing but does report some cough. He does not report any nausea, vomiting, abdominal pain, constipation or  diarrhea. He does not report any frequency urgency or hesitancy. Has not reported any skeletal complaints. Remainder review of systems unremarkable.   Past Medical History  Diagnosis Date  . Hyperlipidemia     takes Lipitor nightly  . 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 Stented coronary artery 2000, 2010  . Heart attack ([place]) 2000  . Shortness of breath     with exertion  . Peripheral edema     takes Furosemide daily  . Pneumonia     hx of >53yr ago  . Headache(784.0)   . Arthritis   . Joint pain   . Joint swelling   . Bruises easily     takes Effient daily  . GERD (gastroesophageal reflux disease)     takes Prilosec prn  . Urinary urgency   . Nocturia   . Diabetes mellitus     takes Januvia,Metformin,and Glipizide daily  . ADD (attention deficit disorder with hyperactivity)   . Anxiety     takes Xanax bid  . Depression     takes Celexa daily  . ADD (attention deficit disorder with hyperactivity)     takes Adderall daily  . Sleep apnea     uses CPAP nightly  . Hypertension   . Coronary artery disease     last cath 1/14  . CHF (congestive heart failure) ([place]   . Asthma   :  Past Surgical History  Procedure Laterality Date  . Knee surgery  24 yrs. ago    right  . Application of wound vac  11/20/2010    and suture of bleeding post-op wound right  buttock  . Rectal surgery  11/20/2010    exc. of infection perianal area right buttock (I & D)  . Ankle arthroscopy  05/13/2011    Procedure: ANKLE ARTHROSCOPY;  Surgeon: Ninetta Lights, MD;  Location: [place];  Service: Orthopedics;  Laterality: Right;  right ankle arthroscopy with extensive debridement synovectomy and chondroplasty  . Colonoscopy    . Total knee arthroplasty  10/13/2011    Procedure: TOTAL KNEE ARTHROPLASTY;  Surgeon: Ninetta Lights, MD;  Location: [place];  Service: Orthopedics;  Laterality: Right;  DR MURPHY WANTS 90 MINUTES FOR THIS CASE  . Cardiac  catheterization  12/05/2008    and in 2000  . Coronary angioplasty      2 stents  . Cardiac catheterization  1/14    no stent put in  . Hydradenitis excision Right 10/30/2012    Procedure: wide EXCISION HYDRADENITIS right buttock ;  Surgeon: Harl Bowie, MD;  Location: [place];  Service: General;  Laterality: Right;  . Cholecystectomy N/A 10/11/2013    Procedure: LAPAROSCOPIC CHOLECYSTECTOMY ;  Surgeon: Harl Bowie, MD;  Location: [place];  Service: General;  Laterality: N/A;  . Left heart catheterization with coronary angiogram N/A 06/15/2012    Procedure: LEFT HEART CATHETERIZATION WITH CORONARY ANGIOGRAM;  Surgeon: Clent Demark, MD;  Location: [facility] CATH LAB;  Service: Cardiovascular;  Laterality: N/A;  . Video bronchoscopy with endobronchial ultrasound Left 08/05/2015    Procedure: VIDEO BRONCHOSCOPY WITH ENDOBRONCHIAL ULTRASOUND with Biopsy;  Surgeon: Collene Gobble, MD;  Location: [place];  Service: Thoracic;  Laterality: Left;  :   Current facility-administered medications:  .  0.9 %  sodium chloride infusion, , Intravenous, Continuous, Cherene Altes, MD, Last Rate: 50 mL/hr at 08/07/15 0753 .  acetaminophen (TYLENOL) tablet 650 mg, 650 mg, Oral, Q6H PRN, 650 mg at 08/06/15 1952 **OR** acetaminophen (TYLENOL) suppository 650 mg, 650 mg, Rectal, Q6H PRN, Rondel Jumbo, PA-C .  ALPRAZolam Duanne Moron) tablet 0.5 mg, 0.5 mg, Oral, BID PRN, Rondel Jumbo, PA-C, 0.5 mg at 08/07/15 0856 .  arformoterol (BROVANA) nebulizer solution 15 mcg, 15 mcg, Nebulization, BID, Jose Shirl Harris, MD, 15 mcg at 08/07/15 [phone] .  aspirin chewable tablet 81 mg, 81 mg, Oral, Daily, Cherene Altes, MD, 81 mg at 08/07/15 0900 .  atorvastatin (LIPITOR) tablet 40 mg, 40 mg, Oral, QPM, Coralee Pesa Wertman, PA-C, 40 mg at 08/06/15 1825 .  budesonide (PULMICORT) nebulizer solution 0.5 mg, 0.5 mg, Nebulization, BID, Jose Angelo A de Chisholm, MD, 0.5 mg at 08/07/15 0846 .  carvedilol (COREG)  tablet 3.125 mg, 3.125 mg, Oral, BID WC, Cherene Altes, MD, 3.125 mg at 08/07/15 0851 .  citalopram (CELEXA) tablet 40 mg, 40 mg, Oral, QPM, Rondel Jumbo, PA-C, 40 mg at 08/06/15 1824 .  clindamycin (CLEOCIN) capsule 300 mg, 300 mg, Oral, 3 times per day, Cherene Altes, MD, 300 mg at 08/07/15 0518 .  enoxaparin (LOVENOX) injection 40 mg, 40 mg, Subcutaneous, Q24H, Collene Gobble, MD, 40 mg at 08/06/15 1824 .  feeding supplement (ENSURE ENLIVE) (ENSURE ENLIVE) liquid 237 mL, 237 mL, Oral, TID BM, Allie Bossier, MD .  insulin aspart (novoLOG) injection 0-15 Units, 0-15 Units, Subcutaneous, TID WC, Cherene Altes, MD, 3 Units at 08/07/15 1151 .  insulin glargine (LANTUS) injection 8 Units, 8 Units, Subcutaneous, QHS, Cherene Altes, MD, 8 Units at 08/06/15 2153 .  ipratropium-albuterol (DUONEB) 0.5-2.5 (3) MG/3ML  nebulizer solution 3 mL, 3 mL, Nebulization, Q6H PRN, Drema Dallas, MD .  magnesium sulfate IVPB 2 g 50 mL, 2 g, Intravenous, Once, Lonia Blood, MD .  nitroGLYCERIN (NITROSTAT) SL tablet 0.4 mg, 0.4 mg, Sublingual, Q5 Min x 3 PRN, Marcos Eke, PA-C .  ondansetron (ZOFRAN) tablet 4 mg, 4 mg, Oral, Q6H PRN **OR** ondansetron (ZOFRAN) injection 4 mg, 4 mg, Intravenous, Q6H PRN, Marcos Eke, PA-C .  oxyCODONE-acetaminophen (PERCOCET/ROXICET) 5-325 MG per tablet 1 tablet, 1 tablet, Oral, Q6H PRN, 1 tablet at 08/07/15 1247 **AND** oxyCODONE (Oxy IR/ROXICODONE) immediate release tablet 5 mg, 5 mg, Oral, Q6H PRN, Ozella Rocks, MD, 5 mg at 08/07/15 1247 .  pantoprazole (PROTONIX) EC tablet 40 mg, 40 mg, Oral, Daily, Ozella Rocks, MD, 40 mg at 08/07/15 0900:  No Known Allergies:  Family History  Problem Relation Age of Onset  . Anesthesia problems Neg Hx   . Hypotension Neg Hx   . Malignant hyperthermia Neg Hx   . Pseudochol deficiency Neg Hx   . Other Mother     car accident; brain injury  :  Social History   Social History  . Marital Status: Married     Spouse Name: Lupita Leash  . Number of Children: 1  . Years of Education: 12th   Occupational History  . n/a    Social History Main Topics  . Smoking status: Current Every Day Smoker -- 1.00 packs/day for 25 years    Types: Cigarettes  . Smokeless tobacco: Never Used  . Alcohol Use: No  . Drug Use: No  . Sexual Activity: Yes   Other Topics Concern  . Not on file   Social History Narrative   Patient lives at home with family.   Caffeine Use: 8-9 cups of soda daily  :  Pertinent items are noted in HPI.  Exam: Blood pressure 113/78, pulse 82, temperature 98.3 F (36.8 C), temperature source Oral, resp. rate 20, height 5\' 8"  (1.727 m), weight 221 lb 1.9 oz (100.3 kg), SpO2 94 %.  ECOG 1 General appearance: alert and cooperative appeared without distress. Head: Normocephalic, without obvious abnormality Throat: lips, mucosa, and tongue normal; poor dental hygiene and possible gingivitis. Neck: no adenopathy Back: negative Resp: clear to auscultation bilaterally with expiratory wheezes. Decreased left breast sounds. Chest wall: no tenderness Cardio: regular rate and rhythm, S1, S2 normal, no murmur, click, rub or gallop GI: soft, non-tender; bowel sounds normal; no masses,  no organomegaly Extremities: extremities normal, atraumatic, no cyanosis or edema Pulses: 2+ and symmetric Skin: Skin color, texture, turgor normal. No rashes or lesions Lymph nodes: Cervical, supraclavicular, and axillary nodes normal.   Recent Labs  08/07/15 0509  WBC 12.0*  HGB 12.3*  HCT 38.3*  PLT 285    Recent Labs  08/05/15 0515 08/07/15 0509  NA 137 135  K 4.2 4.6  CL 98* 98*  CO2 27 26  GLUCOSE 101* 141*  BUN 9 9  CREATININE 0.62 0.56*  CALCIUM 8.4* 8.6*       Dg Orthopantogram  08/03/2015  CLINICAL DATA:  Left-sided jaw pain, area of tooth 23. Edema in floor of mouth. EXAM: ORTHOPANTOGRAM/PANORAMIC COMPARISON:  Limited correlation made with head CT 07/31/2015. FINDINGS: Multiple  teeth are absent. Many of the maxillary teeth are fragmented. Evaluation of the midline structures is limited by Panorex technique due to typical artifact in this area. There is possible lucency surrounding the left mandibular lateral incisor and cuspid. No evidence  of acute fracture or dislocation. IMPRESSION: Probable periapical lucency surrounding the paramedian left mandibular teeth, suspicious for periodontal disease, suboptimally evaluated by Panorex technique due to artifact. Electronically Signed   By: Richardean Sale M.D.   On: 08/03/2015 19:07    Ct Head W Wo Contrast  07/31/2015  CLINICAL DATA:  Follow-up metastatic cancer. EXAM: CT HEAD WITHOUT AND WITH CONTRAST TECHNIQUE: Contiguous axial images were obtained from the base of the skull through the vertex without and with intravenous contrast CONTRAST:  50 cc Omnipaque 300 COMPARISON:  MRI of the brain October 08, 2013 FINDINGS: RIGHT frontotemporal encephalomalacia. No intraparenchymal hemorrhage, mass effect, midline shift or acute large vascular territory infarcts. No abnormal insular parenchymal enhancement. No abnormal extra-axial fluid collections. 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 No abnormal extra-axial enhancement. Ocular globes and orbital contents are normal. Included paranasal sinuses and mastoid air cells are well aerated. No destructive bony lesions in the calvarium, no skull fracture. IMPRESSION: No CT findings of intracranial metastasis though, MRI with contrast is more sensitive. 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 Electronically Signed   By: Elon Alas M.D.   On: 07/31/2015 21:37   Ct Chest W Contrast  07/31/2015  CLINICAL DATA:  Pleural effusion.  Left lung mass. EXAM: CT CHEST WITH CONTRAST TECHNIQUE: Multidetector CT imaging of the chest was performed during intravenous contrast administration. CONTRAST:  29m OMNIPAQUE IOHEXOL 300 MG/ML  SOLN I was called to  the evaluate the patient at 0506 hours on 07/31/2015 for complaint of contrast extravasation. Estimated volume of extravasation was about 25-30 mL. On examination, the patient was alert and oriented and in no distress. Patient was sitting in the wheelchair with ice pack over the right antecubital fossa. IV cannula was in place in the right antecubital fossa with small amount of bruising which, by history, was present prior to the extravasation. There was approximately 5-6 cm area of swelling in the antecubital fossa anteriorly. The patient states this area was sore to palpation. No redness, blistering, or skin breakdown. Area of swelling was soft to palpation. Distal pulses and capillary refill were normal. Grip strength was normal. Patient was advised place ice packs over the area 3 times a day for the next 3 days or until resolution. Elevation of the extremity was recommended. Patient was instructed to seek medical attention if there is any change in sensation, increased pain, increased swelling, skin breakdown, blistering, or change in skin color. Contrast extravasation protocol orders were entered by the technologist. COMPARISON:  Chest radiograph 07/30/2015 FINDINGS: Normal heart size. Prominent Coronary artery calcifications. Cardiac stents are likely present. Normal caliber thoracic aorta. Lymphadenopathy in the chest with subcarinal lymph nodes measuring about 2.5 cm short axis dimension. Right superior paratracheal nodes measuring about 11 mm diameter. Esophagus is decompressed. There is a moderate left pleural effusion with collapse and consolidation of much of the left lung. Minimal residual aeration is present. The left mainstem bronchus is narrowed with probable left perihilar mass lesion. Presumed mass is obscured by the surrounding postobstructive change and effusion. Mild atelectasis in the right lung base. No pneumothorax. Included portions of the upper abdominal organs demonstrate bilateral adrenal  gland nodules, measuring 2.8 cm on the right and 4.2 cm on the left. These are likely metastatic. Surgical absence of the gallbladder. Scattered soft tissue nodules in the subcutaneous fat around the liver and in the omentum may represent metastatic lymph nodes or peritoneal deposits. Degenerative changes throughout the thoracic spine. No  destructive bone lesions are appreciated. Nonobstructing stones in the right kidney. IMPRESSION: Moderate left pleural effusion with collapse and consolidation of the left lung. Narrowing of the left mainstem bronchus likely indicating an obstructing mass lesion although the mass is obscured to visualization by the surrounding postobstructive changes and effusion. Mild atelectasis in the right lung base. Probable metastatic mediastinal lymph nodes, bilateral adrenal gland nodules, and abdominal peritoneal deposits. Note that the examination was complicated by contrast extravasation of about 25-30 cc into the right antecubital fossa. Electronically Signed   By: Lucienne Capers M.D.   On: 07/31/2015 05:55   Ct Abdomen Pelvis W Contrast  07/31/2015  CLINICAL DATA:  Metastatic cancer follow-up. EXAM: CT ABDOMEN AND PELVIS WITH CONTRAST TECHNIQUE: Multidetector CT imaging of the abdomen and pelvis was performed using the standard protocol following bolus administration of intravenous contrast. CONTRAST:  134m OMNIPAQUE IOHEXOL 300 MG/ML  SOLN COMPARISON:  11/30/2010 FINDINGS: Lower chest and abdominal wall: Known large left pleural effusion with mass and collapse in the left lower lobe and lingula. Necrotic subcarinal lymphadenopathy. Extensive coronary atherosclerotic calcification with a remote septal subendocardial infarct. Hepatobiliary: There are at least 6 low-density masses in the liver compatible with metastases. These measure 14 mm or smaller and are marked on the image for follow-up purposes.Cholecystectomy with negative common bile duct. Pancreas: Unremarkable. Spleen:  Unremarkable. Adrenals/Urinary Tract: Centrally low dense bilateral adrenal masses, larger on the left- measuring up to 51 mm. Symmetric renal enhancement. No hydronephrosis Unremarkable bladder. Reproductive:No pathologic findings. Stomach/Bowel:  No obstruction Vascular/Lymphatic: There are numerous mesenteric or less likely peritoneal nodules, the largest along the right colon 4:49 measuring 21 x 28 mm. No ascites. Extensive atherosclerosis. Peritoneal: No ascites or pneumoperitoneum. Musculoskeletal: Focal marrow lesion in the subtrochanteric left medullary cavity. No evidence of cortical involvement or fracture. 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 There is mediastinal adenopathy and malignant appearing left pleural effusion. Intra-abdominal metastases seen in the liver, adrenal glands, and mesentery. 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 Electronically Signed   By: JMonte FantasiaM.D.   On: 07/31/2015 21:54   Dg Chest Port 1 View  08/06/2015  CLINICAL DATA:  Pleural effusion. EXAM: PORTABLE CHEST 1 VIEW COMPARISON:  08/04/2015.  CT 07/31/2015 FINDINGS: Narrowing of the left mainstem bronchus again noted. Opacification of the left hemithorax is noted. Mild right base subsegmental atelectasis. Heart size difficult to assess due to opacification of the left hemi thorax. No pneumothorax. IMPRESSION: Persistent opacification of the left hemi thorax with narrowing of the left mainstem bronchus. Reference is made to prior CT report 07/31/2015. Electronically Signed   By: TMarcello Moores Register   On: 08/06/2015 07:42    Assessment and Plan:   50year old gentleman with the following issues:  1. 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 He presented with a lung mass and likely endobronchial lesion and left lung collapse. He has pleural effusion as well as documented metastatic disease to the liver, adrenal and the left femur indicating  stage IV disease. He is status post a biopsy which confirmed the presence of squamous cell carcinoma and the PDL1 one testing is currently pending.  The natural course of this disease was discussed with the patient and his wife today. He understands that this his disease is incurable at this time and any treatment moving forward would be palliative. I discussed with him the role of palliative chemotherapy as well as palliative immunotherapy depending on his PDL 1 status. Regardless of  which agents are used, the best we can hope for is decrease in his disease burden, palliation of symptoms and prolongation of survival. His disease is rather aggressive and fairly extensive and carries a rather poor prognosis.  Despite his young age, he has multiple comorbid conditions and heavy smoking history which contributes to his poor prognosis. I do think he will be a candidate for systemic therapy which will be initiated as an outpatient.  I've given him my contact information and we will schedule him an outpatient follow-up next week at that time we will discuss the next step of treatment.  2. Left lung collapse: Related to endobronchial lesion. His respiratory status appears stable at this time but if it declines, we can consider radiation therapy to the left thorax an attempt to palliate his symptoms of respiratory distress.  3. Left femur lesion: This will also need to be evaluated carefully evaluate her risk of fracture. Palliative radiation therapy might be an option as well if he becomes problematic.  4. Respiratory distress: Related due to his cancer diagnosis, heavy smoking history and possible sleep apnea. He does have also left pleural effusion that can be tapped in the future if the fluid reaccumulates.   5. Disposition: I anticipate discharge in the near future once his other issues stabilizes. We will arrange follow-up at the [facility] upon his immediate discharge. He will be contacted with the  time and date for follow-up.

## 2015-08-07 NOTE — Progress Notes (Signed)
[place] TEAM 1 - Stepdown/ICU TEAM Progress Note  Cody [name] RKY:[id number] DOB: 09-24-1965 DOA: 07/30/2015 PCP: Chesley Noon, MD  Admit HPI / Brief Narrative: 50 y.o. WM PMHx  TBI, ADD, Anxiety, Depression, HTN, CAD native artery, MI, DM Type2, HLD, OSA on CPAP  Presenting today ED After presenting to med Center in [place] ED with increasing shortness of breath, cough, congestion. He had been diagnosed with pneumonia 3 weeks prior, but the symptoms were not relieved with antibiotics as an outpatient. CT performed at [facility], [place] shown large left-sided pleural effusion, mediastinal adenopathy suspicious for endobronchial/hilar mass with lymph node metastases. Also seen, centered right-sided pulmonary nodules up to 3 mm. No PE was noted. Because of the lengthy wait at [facility] the patient decided to present here for further evaluation Denies fevers, chills, night sweats or mucositis. Denies hemoptysis. Denies any chest pain or palpitations. Denies lower extremity swelling. No confusion was reported. He denies any vision changes or double vision. He denies any headaches. He denies any abdominal pain,. He has been more nauseous without vomiting over the last week, without dizziness or vertigo. He has decreased appetite due to failure to thrive with unintentional 30 lbs weight loss.  At the ED, his Vitals are stable, OSATs normal. He was afebrile. CBC and CMET remarkable for K 5.4 with neg Tn at 0. He will be admitted for left throracentesis and further workup.   HPI/Subjective: 2/23 A/ O 4,, negative increased N/V/SOB   Assessment/Plan:  Acute respiratory failure with hypoxia secondary to large left pleural effusion -Resolved with thoracentesis  Lung mass/Left lung SQUAMOUS CELL CARCINOMA,   -CT angio on 2/14 2.8 x 4.7 cm lymph node conglomerate subcarinal location. Several mildly enlarged paraesophageal lymph nodes also identified. Small amount of fluid within the  esophagus. Mild left to right mediastinal shift. 1.2 cm AP window lymph nodes. Suspect SCC  -LDH elevated at 683 -CT head W/WO contrast pending -CT abdomen pelvis W contrast pending -2/21 S/P EBUS, SQUAMOUS CELL CARCINOMA.  -PCCM consulted bronchoscopy for biopsy? Stenting of left mainstem bronchus? Discussed ideal with Dr. Titus [facility] who is not sure this is the best idea. He will discuss with Dr. Lamonte Sakai [facility] M who actually performed bronchoscopy in a.m. -Dr.Firas Jefm [facility] oncology has seen patient plan is for chemotherapy/radiation next week schedule follow-up for patient  Large Left Pleural Effusion/ -2/15 S/P left thoracentesis 2 L -Most likely malignant given parents and smoking history  -2/21 PCXR in a.m. patient will most likely require another therapeutic thoracentesis; continues to show opacification left hemithorax, however since patient asymptomatic we will just continue to monitor.  Postobstructive Pneumonia 2dary to extrinsic pressure; intrinsic invasion? -Afebrile, negative leukocytosis hold antibiotics -Left long now completely collapsed. 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    Consultants: Dr.Wesam Kathryne Sharper [facility] CM Dr.Firas Jefm [facility] oncology   Procedure/Significant Events: 2/14 CT Angiogram at [facility];-Occlusion of the left mainstem bronchus with relatively material. Mass  effect on the left mainstem bronchus with associated narrowing  from the adjacent mass lesion. -Mediastinum/hila: 2.8 x 4.7 cm lymph node conglomerate subcarinal location. Several mildly enlarged Paraesophageal lymph nodes also identified. -Mild left to right mediastinal shift. 1.2 cm AP window LN. -Heart; Minimal pericardial effusion. -Complete collapse  left lung. 3 mm nodule right upper lobe close associated with the right minor fissure. 4 x 3 mm RML pulmonary nodule image -3 mm LLL nodule . 2/15 left thoracentesis; 2 L amber fluid removed 2/16 CT chest with contrast; -Moderate left pleural effusion with collapse and consolidation of the left lung. -narrowing of the left mainstem bronchus likely indicating an obstructing mass lesion although the mass is obscured to visualization by the surrounding postobstructive changes and effusion.  -Mild atelectasis in the right lung base.- Probable metastatic mediastinal lymph nodes, bilateral adrenal gland nodules,and abdominal peritoneal deposits. 2/16 echocardiogram;- Left ventricle: mildconcentric hypertrophy.- LVEF= 55% to 60%. -(grade 2 diastolic dysfunction).- Pericardium, moderate-sized leftpleural effusion. 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 There is mediastinal adenopathy and malignant appearing left pleural effusion. Intra-abdominal metastases seen in the liver, adrenal glands, and mesentery. 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 2/16 CT head W/WO contrast;No CT findings of intracranial metastasis though, MRI with contrast is more sensitive. 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 2/21S/P EBUS:SQUAMOUS CELL CARCINOMA more poorly differentiated. Immunohistochemistry reveals the tumor is positive for cytokeratin 5/6, p63(focal), and cytokeratin 7 (focal). Napsin A, TTF-1, CD56, chromogranin, and synaptophysin are negative in the tumor cells. 2/22 PCXR; continued opacification  left hemithorax   Culture 2/15 left pleural fluid pending 2/15 left pleural fluid cytology; nondiagnostic   Antibiotics: NA  DVT prophylaxis: Subcutaneous heparin   Devices NA  LINES / TUBES:  NA    Continuous Infusions: . sodium chloride 50 mL/hr at 08/07/15 0753    Objective: VITAL SIGNS: Temp: 98.3 F (36.8 C) (02/23 1340) Temp Source: Oral (02/23 1340) BP: 113/72 mmHg (02/23 1704) Pulse Rate: 88 (02/23 1704) SPO2; FIO2:   Intake/Output Summary (Last 24 hours) at 08/07/15 1801 Last data filed at 08/07/15 1200  Gross per 24 hour  Intake   1770 ml  Output   1175 ml  Net    595 ml     Exam: General:  A/ O 4,, negative increased N/V/SOB, No acute respiratory distress at rest Eyes: Negative headache, negative scleral hemorrhage ENT: Negative Runny nose, negative gingival bleeding, Neck:  Negative scars, masses, torticollis, lymphadenopathy, JVD Lungs: absent LUL breath sounds/lingula and LLL, RUL/RML/RLL mild expiratory wheezing, negative crackles Cardiovascular: Regular rate and rhythm without murmur gallop or rub normal S1 and S2 Abdomen:negative abdominal pain, nondistended, positive soft, bowel sounds, no rebound, no ascites, no appreciable mass Extremities: No significant cyanosis, clubbing, or edema bilateral lower extremities Psychiatric:  Negative depression, negative anxiety, negative fatigue, negative mania  Neurologic:  Cranial nerves II through XII intact, tongue/uvula midline, all extremities muscle strength 5/5, sensation intact throughout, negative dysarthria, negative expressive aphasia, negative receptive aphasia.   Data Reviewed: Basic Metabolic Panel:  Recent Labs Lab 08/01/15 0550 08/02/15 0442 08/04/15 0428 08/05/15 0515 08/07/15 0509  NA 132* 135 133* 137 135  K 4.6 4.8 4.0 4.2 4.6  CL 94* 96* 96* 98* 98*  CO2 '24 27 28 27 26  '$ GLUCOSE 163* 128* 120* 101* 141*  BUN '9 12 12 9 9  '$ CREATININE 0.55* 0.76 0.59* 0.62 0.56*    CALCIUM 9.2 8.7* 8.4* 8.4* 8.6*  MG 1.6* 1.7  --   --  1.7   Liver Function Tests:  Recent Labs Lab 08/01/15 0550 08/02/15 0442 08/05/15 0515 08/07/15 0509  AST 44* 45* 37 34  ALT 16* '18 22 22  '$ ALKPHOS 114 150* 134* 118  BILITOT 1.4* 0.6 0.5 0.8  PROT 6.8 5.6* 5.7* 6.3*  ALBUMIN 2.9* 2.4* 2.3* 2.3*   No results for input(s): LIPASE, AMYLASE in the last 168 hours. No results for input(s): AMMONIA in the last 168 hours. CBC:  Recent Labs Lab 08/01/15 0550 08/02/15 0442 08/04/15 0428 08/07/15 0509  WBC 11.8* 9.6 10.8* 12.0*  NEUTROABS 9.2*  --   --  9.2*  HGB 13.8 12.9* 12.0* 12.3*  HCT 41.9 39.4 36.7* 38.3*  MCV 91.5 91.4 90.8 90.5  PLT 264 250 249 285   Cardiac Enzymes: No results for input(s): CKTOTAL, CKMB, CKMBINDEX, TROPONINI in the last 168 hours. BNP (last 3 results) No results for input(s): BNP in the last 8760 hours.  ProBNP (last 3 results) No results for input(s): PROBNP in the last 8760 hours.  CBG:  Recent Labs Lab 08/06/15 1658 08/06/15 2104 08/07/15 0808 08/07/15 1141 08/07/15 1640  GLUCAP 223* 205* 121* 185* 225*    Recent Results (from the past 240 hour(s))  Culture, body fluid-bottle     Status: None   Collection Time: 07/30/15  2:23 PM  Result Value Ref Range Status   Specimen Description FLUID PLEURAL LEFT  Final   Special Requests BOTTLES DRAWN AEROBIC AND ANAEROBIC 10CC  Final   Culture NO GROWTH 5 DAYS  Final   Report Status 08/04/2015 FINAL  Final  Gram stain     Status: None   Collection Time: 07/30/15  2:23 PM  Result Value Ref Range Status   Specimen Description FLUID PLEURAL LEFT  Final   Special Requests NONE  Final   Gram Stain   Final    CYTOSPIN SMEAR WBC PRESENT,BOTH PMN AND MONONUCLEAR NO ORGANISMS SEEN    Report Status 07/30/2015 FINAL  Final  MRSA PCR Screening     Status: None   Collection Time: 07/30/15  7:18 PM  Result Value Ref Range Status   MRSA by PCR NEGATIVE NEGATIVE Final    Comment:         The GeneXpert MRSA Assay (FDA approved for NASAL specimens only), is one component of a comprehensive MRSA colonization surveillance program. It is not intended to diagnose MRSA infection nor to guide or monitor treatment for MRSA infections.      Studies:  Recent x-ray studies have been reviewed in detail by the Attending Physician  Scheduled Meds:  Scheduled Meds: . arformoterol  15 mcg Nebulization BID  . aspirin  81 mg Oral Daily  . atorvastatin  40 mg Oral QPM  . budesonide (PULMICORT) nebulizer solution  0.5 mg Nebulization BID  . carvedilol  3.125 mg Oral BID WC  . citalopram  40 mg Oral QPM  . clindamycin  300 mg Oral 3 times per day  . enoxaparin (LOVENOX) injection  40 mg Subcutaneous Q24H  . feeding supplement (ENSURE ENLIVE)  237 mL Oral TID BM  . insulin aspart  0-15 Units Subcutaneous TID WC  . insulin glargine  8 Units Subcutaneous QHS  . magnesium sulfate 1 - 4 g bolus IVPB  2 g Intravenous Once  . pantoprazole  40 mg Oral Daily    Time spent on care of this patient: 40 mins   Sua Spadafora, Geraldo Docker , MD  [facility] Office  [phone] Pager - [phone]  On-Call/Text Page:      [email or web address]      password [email or web address]  If 7PM-7AM, please contact night-coverage [email or web address] Password [email or web address] 08/07/2015, 6:01 PM   LOS: 8 days   Care during the described time interval was provided by me .  I have reviewed this patient's available data, including medical history, events of note, physical examination, and all test results as part of my evaluation. I have personally reviewed and interpreted all radiology studies.   Dia Crawford, MD [phone] Pager

## 2015-08-08 ENCOUNTER — Encounter (HOSPITAL_COMMUNITY): Payer: Self-pay | Admitting: Internal Medicine

## 2015-08-08 ENCOUNTER — Telehealth: Payer: Self-pay | Admitting: Oncology

## 2015-08-08 DIAGNOSIS — C3432 Malignant neoplasm of lower lobe, left bronchus or lung: Secondary | ICD-10-CM | POA: Diagnosis present

## 2015-08-08 DIAGNOSIS — C801 Malignant (primary) neoplasm, unspecified: Secondary | ICD-10-CM

## 2015-08-08 LAB — GLUCOSE, CAPILLARY
GLUCOSE-CAPILLARY: 114 mg/dL — AB (ref 65–99)
Glucose-Capillary: 142 mg/dL — ABNORMAL HIGH (ref 65–99)

## 2015-08-08 NOTE — Care Management Important Message (Signed)
Important Message  Patient Details  Name: Cody [name] MRN: [id number] Date of Birth: 1965-08-09   Medicare Important Message Given:  Yes    Nathen May 08/08/2015, 11:57 AM

## 2015-08-08 NOTE — Progress Notes (Signed)
Pt in stable condition for DC, went over discharge instructions with pt, pt verbalizes understanding, IV dc, cardiac monitor dc, pt belongings at bedside, pt was taken off the floor on a wheelchair by  NT.

## 2015-08-08 NOTE — Telephone Encounter (Signed)
Lt mess for pt regarding hospital f/u appt for 08/15/15.

## 2015-08-08 NOTE — Discharge Summary (Signed)
Physician Discharge Summary  Cody [name] DGL:[id number] DOB: December 18, 1965 DOA: 07/30/2015  PCP: Chesley Noon, MD  Admit date: 07/30/2015 Discharge date: 08/08/2015  Time spent: 35 minutes  Recommendations for Outpatient Follow-up: Acute respiratory failure with hypoxia secondary to large left pleural effusion -Resolved with thoracentesis -Subsequently collapse of left lung secondary to mass in left mainstem bronchus.  Lung mass/Left lung SQUAMOUS CELL CARCINOMA,  -CT angio on 2/14 2.8 x 4.7 cm lymph node conglomerate subcarinal location. Several mildly enlarged paraesophageal lymph nodes also identified. Small amount of fluid within the esophagus. Mild left to right mediastinal shift. 1.2 cm AP window lymph nodes. Suspect SCC  -LDH elevated at 683 -CT head W/WO contrast pending -CT abdomen pelvis W contrast pending -2/21 S/P EBUS, SQUAMOUS CELL CARCINOMA.  -PCCM consulted bronchoscopy for biopsy? Stenting of left mainstem bronchus? Discussed ideal with Dr. Titus Mould [facility] M who spoke with Dr. Lamonte Sakai [facility] M who actually performed bronchoscopy and both agree not amenable to stenting.  -Dr.Firas Jefm Bryant oncology has seen patient plan is for chemotherapy/radiation next week schedule follow-up for patient  Large Left Pleural Effusion/Left Lung Collapse -2/15 S/P left thoracentesis 2 L -Most likely malignant given parents and smoking history  -See lung mass  Postobstructive Pneumonia 2dary to extrinsic pressure; intrinsic invasion? -Afebrile, negative leukocytosis hold antibiotics -Left long now completely collapsed. 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 -5/18 Hemoglobin A1c= 6.4  -Restart glipizide 10 mg  BID -Restart metformin 1000 mg BID -Restart Januvia 100 mg daily  HLD -Lipid panel slightly outside ADA guidelines but will hold off on starting any new medication at this point, given patient most likely will require  chemotherapy/XRT  Hypertension  -Coreg 3.125 mg BID  Chronic Diastolic CHF -See hypertension   Hyperkalemia, -Resolved   Sleep Apnea  -CPAP per RT      Discharge Diagnoses:  Active Problems:   DOE (dyspnea on exertion)   Hypertension   Diabetes (HCC)   Coronary artery disease   Anxiety   Lung mass   Pleural effusion, left   Recurrent left pleural effusion   Diabetes mellitus due to underlying condition with hyperosmolarity without coma, without long-term current use of insulin (HCC)   Mass of lower lobe of left lung   Postobstructive pneumonia   Controlled diabetes mellitus type 2 with complications (HCC)   HLD (hyperlipidemia)   Chronic diastolic CHF (congestive heart failure) (HCC)   OSA on CPAP   Discharge Condition: Guarded  Diet recommendation: Heart healthy   Filed Weights   08/06/15 0457 08/07/15 0355 08/08/15 0444  Weight: 101.2 kg (223 lb 1.7 oz) 100.3 kg (221 lb 1.9 oz) 103.5 kg (228 lb 2.8 oz)    History of present illness:  50 y.o. WM PMHx TBI, ADD, Anxiety, Depression, HTN, CAD native artery, MI, DM Type2, HLD, OSA on CPAP  Presenting today ED After presenting to [facility] Center in [place] [employer] [place] ED with increasing shortness of breath, cough, congestion. He had been diagnosed with pneumonia 3 weeks prior, but the symptoms were not relieved with antibiotics as an outpatient. CT performed at [facility], [place] shown large left-sided pleural effusion, mediastinal adenopathy suspicious for endobronchial/hilar mass with lymph node metastases. Also seen, centered right-sided pulmonary nodules up to 3 mm. No PE was noted. Because of the lengthy wait at [facility] the patient decided to present here for further evaluation Denies fevers, chills, night sweats or mucositis. Denies hemoptysis. Denies any chest pain or palpitations. Denies lower extremity swelling. No confusion  was reported. He denies any vision changes or double vision. He denies any headaches.  He denies any abdominal pain,. He has been more nauseous without vomiting over the last week, without dizziness or vertigo. He has decreased appetite due to failure to thrive with unintentional 30 lbs weight loss.  At the ED, his Vitals are stable, OSATs normal. He was afebrile. CBC and CMET remarkable for K 5.4 with neg Tn at 0. He will be admitted for left throracentesis and further workup.   During this hospitalization patient was initially admitted for pneumonia. However during workup was determined to have a left mainstem bronchus mass, as well as left malignant pleural effusion. Thoracentesis of left effusion was nondiagnostic. EBUS with brushings and biopsy was performed which revealed squamous cell carcinoma. Unfortunately patient has suffered full collapse of left lung. Spoke with PCCM prior to discharge concerning possibly placing stent left mainstem bronchus. Dr. Lamonte Sakai actually performed EBUS as well as Dr. Titus Mould both felt the mass was too extensive to be amenable to stenting. Patient has been seen by Dr. Alen Blew oncology who will see patient next week for chemotherapy and palliative radiation.   Consultants: Dr.Wesam Kathryne [facility] CM Dr.Firas Jefm Bryant oncology   Procedure/Significant Events: 2/14 CT Angiogram at [facility];-Occlusion of the left mainstem bronchus with relatively material. Mass effect on the left mainstem bronchus with associated narrowing from the adjacent mass lesion. -Mediastinum/hila: 2.8 x 4.7 cm lymph node conglomerate subcarinal location. Several mildly enlarged Paraesophageal lymph nodes also identified. -Mild left to right mediastinal shift. 1.2 cm AP window LN. -Heart; Minimal pericardial effusion. -Complete collapse left lung. 3 mm nodule right upper lobe close associated with the right minor fissure. 4 x 3 mm RML pulmonary nodule image -3 mm LLL nodule . 2/15 left thoracentesis; 2 L amber fluid removed 2/16 CT chest with contrast; -Moderate left pleural  effusion with collapse and consolidation of the left lung. -narrowing of the left mainstem bronchus likely indicating an obstructing mass lesion although the mass is obscured to visualization by the surrounding postobstructive changes and effusion.  -Mild atelectasis in the right lung base.- Probable metastatic mediastinal lymph nodes, bilateral adrenal gland nodules,and abdominal peritoneal deposits. 2/16 echocardiogram;- Left ventricle: mildconcentric hypertrophy.- LVEF= 55% to 60%. -(grade 2 diastolic dysfunction).- Pericardium, moderate-sized leftpleural effusion. 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 There is mediastinal adenopathy and malignant appearing left pleural effusion. Intra-abdominal metastases seen in the liver, adrenal glands, and mesentery. 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 2/16 CT head W/WO contrast;No CT findings of intracranial metastasis though, MRI with contrast is more sensitive. 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 2/21S/P EBUS:SQUAMOUS CELL CARCINOMA more poorly differentiated. Immunohistochemistry reveals the tumor is positive for cytokeratin 5/6, p63(focal), and cytokeratin 7 (focal). Napsin A, TTF-1, CD56, chromogranin, and synaptophysin are negative in the tumor cells. 2/22 PCXR; continued opacification left hemithorax   Culture 2/15 left pleural fluid pending 2/15 left pleural fluid cytology; nondiagnostic     Discharge Exam: Filed Vitals:   08/07/15 2032 08/07/15 2133 08/08/15 0444 08/08/15 1017  BP:  107/69 119/80 124/84  Pulse:  81 89 85  Temp:  98.5 F (36.9 C) 98.4 F (36.9 C) 97.7 F (36.5 C)  TempSrc:  Oral Oral Oral  Resp:  '18 20 26  '$ Height:      Weight:   103.5 kg (228 lb 2.8 oz)   SpO2: 94% 94% 98% 94%    General: A/ O  4,, negative increased N/V/SOB, No acute respiratory distress at rest Eyes:  Negative headache, negative scleral hemorrhage ENT: Negative Runny nose, negative gingival bleeding, Neck: Negative scars, masses, torticollis, lymphadenopathy, JVD Lungs: absent LUL breath sounds/lingula and LLL, RUL/RML/RLL mild expiratory wheezing, negative crackles Cardiovascular: Regular rate and rhythm without murmur gallop or rub normal S1 and S2   Discharge Instructions     Medication List    ASK your doctor about these medications        albuterol 108 (90 Base) MCG/ACT inhaler  Commonly known as:  PROVENTIL HFA;VENTOLIN HFA  Inhale 2 puffs into the lungs every 6 (six) hours as needed for wheezing.     ALPRAZolam 1 MG tablet  Commonly known as:  XANAX  Take 0.5 mg by mouth 2 (two) times daily as needed for anxiety.     aspirin 81 MG chewable tablet  Chew 81 mg by mouth 2 (two) times daily.     atorvastatin 40 MG tablet  Commonly known as:  LIPITOR  Take 40 mg by mouth every evening.     calcium carbonate 1250 (500 Ca) MG tablet  Commonly known as:  OS-CAL - dosed in mg of elemental calcium  Take 1 tablet by mouth at bedtime.     carvedilol 3.125 MG tablet  Commonly known as:  COREG  Take 1 tablet (3.125 mg total) by mouth 2 (two) times daily with a meal.     citalopram 40 MG tablet  Commonly known as:  CELEXA  Take 40 mg by mouth every evening. PM     dicyclomine 20 MG tablet  Commonly known as:  BENTYL  Take 20 mg by mouth 2 (two) times daily as needed for spasms.     furosemide 40 MG tablet  Commonly known as:  LASIX  Take 40 mg by mouth daily.     glipiZIDE 10 MG tablet  Commonly known as:  GLUCOTROL  Take 10 mg by mouth 2 (two) times daily before a meal.     ibuprofen 200 MG tablet  Commonly known as:  ADVIL,MOTRIN  Take 200 mg by mouth every 6 (six) hours as needed for moderate pain.     metFORMIN 1000 MG tablet  Commonly known as:  GLUCOPHAGE  Take 1,000 mg by mouth 2 (two) times daily. Patient takes 1000 mg in morning and '1000mg'$  in evening,  daily.     multivitamin with minerals Tabs tablet  Take 1 tablet by mouth daily.     nitroGLYCERIN 0.4 MG SL tablet  Commonly known as:  NITROSTAT  Place 1 tablet (0.4 mg total) under the tongue every 5 (five) minutes x 3 doses as needed for chest pain.     omeprazole 40 MG capsule  Commonly known as:  PRILOSEC  Take 80 mg by mouth at bedtime.     oxyCODONE-acetaminophen 10-325 MG tablet  Commonly known as:  PERCOCET  Take 1 tablet by mouth every 4 (four) hours as needed for pain.     pantoprazole 40 MG tablet  Commonly known as:  PROTONIX  Take 40 mg by mouth daily.     potassium chloride SA 20 MEQ tablet  Commonly known as:  K-DUR,KLOR-CON  Take 20 mEq by mouth daily.     ramipril 5 MG capsule  Commonly known as:  ALTACE  Take 5 mg by mouth daily.     sitaGLIPtin 100 MG tablet  Commonly known as:  JANUVIA  Take 100 mg by mouth every morning.  No Known Allergies     Follow-up Information    Follow up with [facility], [place] In 1 week.   Specialty:  Oncology   Why:  Dr.Firas Jefm Bryant oncology has seen patient plan is for chemotherapy/radiation next week schedule follow-up for patient   Contact information:   [address] [address] [phone]        The results of significant diagnostics from this hospitalization (including imaging, microbiology, ancillary and laboratory) are listed below for reference.    Significant Diagnostic Studies: Dg Orthopantogram  08/03/2015  CLINICAL DATA:  Left-sided jaw pain, area of tooth 23. Edema in floor of mouth. EXAM: ORTHOPANTOGRAM/PANORAMIC COMPARISON:  Limited correlation made with head CT 07/31/2015. FINDINGS: Multiple teeth are absent. Many of the maxillary teeth are fragmented. Evaluation of the midline structures is limited by Panorex technique due to typical artifact in this area. There is possible lucency surrounding the left mandibular lateral incisor and cuspid. No evidence of acute fracture or  dislocation. IMPRESSION: Probable periapical lucency surrounding the paramedian left mandibular teeth, suspicious for periodontal disease, suboptimally evaluated by Panorex technique due to artifact. Electronically Signed   By: Richardean Sale M.D.   On: 08/03/2015 19:07   Dg Chest 1 View  07/30/2015  CLINICAL DATA:  Post left thoracentesis EXAM: CHEST  1 VIEW COMPARISON:  Earlier film of the same day FINDINGS: No pneumothorax. Persistent opacification of the left hemi thorax. Can't exclude left pleural residual left pleural effusion. Narrowing of the distal left mainstem bronchus. Right lung clear. Heart size difficult to assess due to adjacent opacity. Visualized skeletal structures are unremarkable. IMPRESSION: 1. No pneumothorax post thoracentesis. 2. Persistent opacification of the left hemi thorax with left mainstem bronchus narrowing suggesting central obstructing lesion. Consider CT chest with contrast for further assessment. Electronically Signed   By: Lucrezia Europe M.D.   On: 07/30/2015 14:52   Ct Head W Wo Contrast  07/31/2015  CLINICAL DATA:  Follow-up metastatic cancer. EXAM: CT HEAD WITHOUT AND WITH CONTRAST TECHNIQUE: Contiguous axial images were obtained from the base of the skull through the vertex without and with intravenous contrast CONTRAST:  50 cc Omnipaque 300 COMPARISON:  MRI of the brain October 08, 2013 FINDINGS: RIGHT frontotemporal encephalomalacia. No intraparenchymal hemorrhage, mass effect, midline shift or acute large vascular territory infarcts. No abnormal insular parenchymal enhancement. No abnormal extra-axial fluid collections. 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 No abnormal extra-axial enhancement. Ocular globes and orbital contents are normal. Included paranasal sinuses and mastoid air cells are well aerated. No destructive bony lesions in the calvarium, no skull fracture. IMPRESSION: No CT findings of intracranial metastasis though, MRI with contrast is  more sensitive. 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 Electronically Signed   By: Elon Alas M.D.   On: 07/31/2015 21:37   Ct Chest W Contrast  07/31/2015  CLINICAL DATA:  Pleural effusion.  Left lung mass. EXAM: CT CHEST WITH CONTRAST TECHNIQUE: Multidetector CT imaging of the chest was performed during intravenous contrast administration. CONTRAST:  54m OMNIPAQUE IOHEXOL 300 MG/ML  SOLN I was called to the evaluate the patient at 0506 hours on 07/31/2015 for complaint of contrast extravasation. Estimated volume of extravasation was about 25-30 mL. On examination, the patient was alert and oriented and in no distress. Patient was sitting in the wheelchair with ice pack over the right antecubital fossa. IV cannula was in place in the right antecubital fossa with small amount of bruising which, by history,  was present prior to the extravasation. There was approximately 5-6 cm area of swelling in the antecubital fossa anteriorly. The patient states this area was sore to palpation. No redness, blistering, or skin breakdown. Area of swelling was soft to palpation. Distal pulses and capillary refill were normal. Grip strength was normal. Patient was advised place ice packs over the area 3 times a day for the next 3 days or until resolution. Elevation of the extremity was recommended. Patient was instructed to seek medical attention if there is any change in sensation, increased pain, increased swelling, skin breakdown, blistering, or change in skin color. Contrast extravasation protocol orders were entered by the technologist. COMPARISON:  Chest radiograph 07/30/2015 FINDINGS: Normal heart size. Prominent Coronary artery calcifications. Cardiac stents are likely present. Normal caliber thoracic aorta. Lymphadenopathy in the chest with subcarinal lymph nodes measuring about 2.5 cm short axis dimension. Right superior paratracheal nodes measuring about 11 mm diameter. Esophagus  is decompressed. There is a moderate left pleural effusion with collapse and consolidation of much of the left lung. Minimal residual aeration is present. The left mainstem bronchus is narrowed with probable left perihilar mass lesion. Presumed mass is obscured by the surrounding postobstructive change and effusion. Mild atelectasis in the right lung base. No pneumothorax. Included portions of the upper abdominal organs demonstrate bilateral adrenal gland nodules, measuring 2.8 cm on the right and 4.2 cm on the left. These are likely metastatic. Surgical absence of the gallbladder. Scattered soft tissue nodules in the subcutaneous fat around the liver and in the omentum may represent metastatic lymph nodes or peritoneal deposits. Degenerative changes throughout the thoracic spine. No destructive bone lesions are appreciated. Nonobstructing stones in the right kidney. IMPRESSION: Moderate left pleural effusion with collapse and consolidation of the left lung. Narrowing of the left mainstem bronchus likely indicating an obstructing mass lesion although the mass is obscured to visualization by the surrounding postobstructive changes and effusion. Mild atelectasis in the right lung base. Probable metastatic mediastinal lymph nodes, bilateral adrenal gland nodules, and abdominal peritoneal deposits. Note that the examination was complicated by contrast extravasation of about 25-30 cc into the right antecubital fossa. Electronically Signed   By: Lucienne Capers M.D.   On: 07/31/2015 05:55   Ct Abdomen Pelvis W Contrast  07/31/2015  CLINICAL DATA:  Metastatic cancer follow-up. EXAM: CT ABDOMEN AND PELVIS WITH CONTRAST TECHNIQUE: Multidetector CT imaging of the abdomen and pelvis was performed using the standard protocol following bolus administration of intravenous contrast. CONTRAST:  130m OMNIPAQUE IOHEXOL 300 MG/ML  SOLN COMPARISON:  11/30/2010 FINDINGS: Lower chest and abdominal wall: Known large left pleural  effusion with mass and collapse in the left lower lobe and lingula. Necrotic subcarinal lymphadenopathy. Extensive coronary atherosclerotic calcification with a remote septal subendocardial infarct. Hepatobiliary: There are at least 6 low-density masses in the liver compatible with metastases. These measure 14 mm or smaller and are marked on the image for follow-up purposes.Cholecystectomy with negative common bile duct. Pancreas: Unremarkable. Spleen: Unremarkable. Adrenals/Urinary Tract: Centrally low dense bilateral adrenal masses, larger on the left- measuring up to 51 mm. Symmetric renal enhancement. No hydronephrosis Unremarkable bladder. Reproductive:No pathologic findings. Stomach/Bowel:  No obstruction Vascular/Lymphatic: There are numerous mesenteric or less likely peritoneal nodules, the largest along the right colon 4:49 measuring 21 x 28 mm. No ascites. Extensive atherosclerosis. Peritoneal: No ascites or pneumoperitoneum. Musculoskeletal: Focal marrow lesion in the subtrochanteric left medullary cavity. No evidence of cortical involvement or fracture. IMPRESSION: Advanced malignancy, likely primary left lung bronchogenic  carcinoma. There is mediastinal adenopathy and malignant appearing left pleural effusion. Intra-abdominal metastases seen in the liver, adrenal glands, and mesentery. 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 Electronically Signed   By: Monte Fantasia M.D.   On: 07/31/2015 21:54   Dg Chest Port 1 View  08/06/2015  CLINICAL DATA:  Pleural effusion. EXAM: PORTABLE CHEST 1 VIEW COMPARISON:  08/04/2015.  CT 07/31/2015 FINDINGS: Narrowing of the left mainstem bronchus again noted. Opacification of the left hemithorax is noted. Mild right base subsegmental atelectasis. Heart size difficult to assess due to opacification of the left hemi thorax. No pneumothorax. IMPRESSION: Persistent opacification of the left hemi thorax with narrowing of the left mainstem bronchus.  Reference is made to prior CT report 07/31/2015. Electronically Signed   By: Marcello Moores  Register   On: 08/06/2015 07:42   Dg Chest Port 1 View  08/04/2015  CLINICAL DATA:  50 year old male with left-sided pleural effusion. EXAM: PORTABLE CHEST 1 VIEW COMPARISON:  Chest radiograph dated 07/30/2015 CT dated 07/31/2015 FINDINGS: There is complete opacification of left hemithorax. The right lung is clear. There is no pneumothorax. There is persistent high-grade narrowing of the left mainstem bronchus as seen on the prior study and concerning for underlying obstructive mass. There is no significant mediastinal shift. The osseous structures are grossly unremarkable. IMPRESSION: Persistent opacification of the left hemithorax with narrowing of the left mainstem bronchus. Electronically Signed   By: Anner Crete M.D.   On: 08/04/2015 07:39   Dg Chest Portable 1 View  07/30/2015  CLINICAL DATA:  Diminished lung sounds on the left. Shortness of breath and pneumonia. EXAM: PORTABLE CHEST 1 VIEW COMPARISON:  12/06/2012 FINDINGS: Right chest is normal. Left hemi thorax is completely opacified consistent with a combination of pulmonary infiltrate, collapse and pleural fluid. Mediastinum is not shifted. No bony abnormality seen. IMPRESSION: Complete opacification of the left hemi thorax without mediastinal shift. This is likely due to a combination of pneumonia, atelectasis and pleural fluid. Electronically Signed   By: Nelson Chimes M.D.   On: 07/30/2015 11:13   US Thoracentesis Asp Pleural Space W/img Guide  07/30/2015  INDICATION: Shortness of breath. Recent pneumonia. Large left pleural effusion. Request for diagnostic and therapeutic thoracentesis. EXAM: ULTRASOUND GUIDED LEFT THORACENTESIS MEDICATIONS: 1% Lidocaine. COMPLICATIONS: None immediate. PROCEDURE: An ultrasound guided thoracentesis was thoroughly discussed with the patient and questions answered. The benefits, risks, alternatives and complications were  also discussed. The patient understands and wishes to proceed with the procedure. Written consent was obtained. Ultrasound was performed to localize and Kenaz an adequate pocket of fluid in the left chest. The area was then prepped and draped in the normal sterile fashion. 1% Lidocaine was used for local anesthesia. Under ultrasound guidance a 6 Fr Safe-T-Centesis catheter was introduced. Thoracentesis was performed. The catheter was removed and a dressing applied. FINDINGS: A total of approximately 2 liters of amber fluid was removed. Samples were sent to the laboratory as requested by the clinical team. IMPRESSION: Successful ultrasound guided left thoracentesis yielding 2 liters of pleural fluid. Read by:  Gareth Eagle, PA-C Electronically Signed   By: Marybelle Killings M.D.   On: 07/30/2015 14:55    Microbiology: Recent Results (from the past 240 hour(s))  Culture, body fluid-bottle     Status: None   Collection Time: 07/30/15  2:23 PM  Result Value Ref Range Status   Specimen Description FLUID PLEURAL LEFT  Final   Special Requests BOTTLES DRAWN AEROBIC AND ANAEROBIC 10CC  Final  Culture NO GROWTH 5 DAYS  Final   Report Status 08/04/2015 FINAL  Final  Gram stain     Status: None   Collection Time: 07/30/15  2:23 PM  Result Value Ref Range Status   Specimen Description FLUID PLEURAL LEFT  Final   Special Requests NONE  Final   Gram Stain   Final    CYTOSPIN SMEAR WBC PRESENT,BOTH PMN AND MONONUCLEAR NO ORGANISMS SEEN    Report Status 07/30/2015 FINAL  Final  MRSA PCR Screening     Status: None   Collection Time: 07/30/15  7:18 PM  Result Value Ref Range Status   MRSA by PCR NEGATIVE NEGATIVE Final    Comment:        The GeneXpert MRSA Assay (FDA approved for NASAL specimens only), is one component of a comprehensive MRSA colonization surveillance program. It is not intended to diagnose MRSA infection nor to guide or monitor treatment for MRSA infections.      Labs: Basic  Metabolic Panel:  Recent Labs Lab 08/02/15 0442 08/04/15 0428 08/05/15 0515 08/07/15 0509  NA 135 133* 137 135  K 4.8 4.0 4.2 4.6  CL 96* 96* 98* 98*  CO2 '27 28 27 26  '$ GLUCOSE 128* 120* 101* 141*  BUN '12 12 9 9  '$ CREATININE 0.76 0.59* 0.62 0.56*  CALCIUM 8.7* 8.4* 8.4* 8.6*  MG 1.7  --   --  1.7   Liver Function Tests:  Recent Labs Lab 08/02/15 0442 08/05/15 0515 08/07/15 0509  AST 45* 37 34  ALT '18 22 22  '$ ALKPHOS 150* 134* 118  BILITOT 0.6 0.5 0.8  PROT 5.6* 5.7* 6.3*  ALBUMIN 2.4* 2.3* 2.3*   No results for input(s): LIPASE, AMYLASE in the last 168 hours. No results for input(s): AMMONIA in the last 168 hours. CBC:  Recent Labs Lab 08/02/15 0442 08/04/15 0428 08/07/15 0509  WBC 9.6 10.8* 12.0*  NEUTROABS  --   --  9.2*  HGB 12.9* 12.0* 12.3*  HCT 39.4 36.7* 38.3*  MCV 91.4 90.8 90.5  PLT 250 249 285   Cardiac Enzymes: No results for input(s): CKTOTAL, CKMB, CKMBINDEX, TROPONINI in the last 168 hours. BNP: BNP (last 3 results) No results for input(s): BNP in the last 8760 hours.  ProBNP (last 3 results) No results for input(s): PROBNP in the last 8760 hours.  CBG:  Recent Labs Lab 08/07/15 1141 08/07/15 1640 08/07/15 2129 08/08/15 0612 08/08/15 1125  GLUCAP 185* 225* 145* 142* 114*       Signed:  Dia Crawford, MD [facility] [phone] pager

## 2015-08-13 ENCOUNTER — Encounter (HOSPITAL_COMMUNITY): Payer: Self-pay

## 2015-08-13 ENCOUNTER — Telehealth: Payer: Self-pay | Admitting: *Deleted

## 2015-08-13 ENCOUNTER — Encounter: Payer: Self-pay | Admitting: *Deleted

## 2015-08-13 ENCOUNTER — Emergency Department (HOSPITAL_COMMUNITY): Payer: Medicare HMO

## 2015-08-13 ENCOUNTER — Other Ambulatory Visit: Payer: Self-pay | Admitting: *Deleted

## 2015-08-13 ENCOUNTER — Inpatient Hospital Stay (HOSPITAL_COMMUNITY)
Admission: EM | Admit: 2015-08-13 | Discharge: 2015-08-15 | DRG: 180 | Disposition: A | Discharge status: Home / Self Care | Payer: Medicare HMO | Attending: Internal Medicine | Admitting: Internal Medicine

## 2015-08-13 ENCOUNTER — Telehealth: Payer: Self-pay | Admitting: Oncology

## 2015-08-13 DIAGNOSIS — C3432 Malignant neoplasm of lower lobe, left bronchus or lung: Principal | ICD-10-CM | POA: Diagnosis present

## 2015-08-13 DIAGNOSIS — F419 Anxiety disorder, unspecified: Secondary | ICD-10-CM | POA: Diagnosis present

## 2015-08-13 DIAGNOSIS — E785 Hyperlipidemia, unspecified: Secondary | ICD-10-CM | POA: Diagnosis present

## 2015-08-13 DIAGNOSIS — C786 Secondary malignant neoplasm of retroperitoneum and peritoneum: Secondary | ICD-10-CM | POA: Diagnosis present

## 2015-08-13 DIAGNOSIS — M1990 Unspecified osteoarthritis, unspecified site: Secondary | ICD-10-CM | POA: Diagnosis present

## 2015-08-13 DIAGNOSIS — I252 Old myocardial infarction: Secondary | ICD-10-CM | POA: Diagnosis not present

## 2015-08-13 DIAGNOSIS — Z8701 Personal history of pneumonia (recurrent): Secondary | ICD-10-CM

## 2015-08-13 DIAGNOSIS — K219 Gastro-esophageal reflux disease without esophagitis: Secondary | ICD-10-CM | POA: Diagnosis present

## 2015-08-13 DIAGNOSIS — Z7982 Long term (current) use of aspirin: Secondary | ICD-10-CM

## 2015-08-13 DIAGNOSIS — C7970 Secondary malignant neoplasm of unspecified adrenal gland: Secondary | ICD-10-CM | POA: Diagnosis present

## 2015-08-13 DIAGNOSIS — J90 Pleural effusion, not elsewhere classified: Secondary | ICD-10-CM | POA: Diagnosis present

## 2015-08-13 DIAGNOSIS — F909 Attention-deficit hyperactivity disorder, unspecified type: Secondary | ICD-10-CM | POA: Diagnosis present

## 2015-08-13 DIAGNOSIS — F17210 Nicotine dependence, cigarettes, uncomplicated: Secondary | ICD-10-CM | POA: Diagnosis present

## 2015-08-13 DIAGNOSIS — J9601 Acute respiratory failure with hypoxia: Secondary | ICD-10-CM | POA: Diagnosis present

## 2015-08-13 DIAGNOSIS — F329 Major depressive disorder, single episode, unspecified: Secondary | ICD-10-CM | POA: Diagnosis present

## 2015-08-13 DIAGNOSIS — E118 Type 2 diabetes mellitus with unspecified complications: Secondary | ICD-10-CM | POA: Diagnosis present

## 2015-08-13 DIAGNOSIS — J910 Malignant pleural effusion: Secondary | ICD-10-CM | POA: Diagnosis present

## 2015-08-13 DIAGNOSIS — Z7984 Long term (current) use of oral hypoglycemic drugs: Secondary | ICD-10-CM | POA: Diagnosis not present

## 2015-08-13 DIAGNOSIS — C7951 Secondary malignant neoplasm of bone: Secondary | ICD-10-CM | POA: Diagnosis present

## 2015-08-13 DIAGNOSIS — R0902 Hypoxemia: Secondary | ICD-10-CM | POA: Insufficient documentation

## 2015-08-13 DIAGNOSIS — J9811 Atelectasis: Secondary | ICD-10-CM | POA: Diagnosis present

## 2015-08-13 DIAGNOSIS — C787 Secondary malignant neoplasm of liver and intrahepatic bile duct: Secondary | ICD-10-CM | POA: Diagnosis present

## 2015-08-13 DIAGNOSIS — Z87820 Personal history of traumatic brain injury: Secondary | ICD-10-CM | POA: Diagnosis not present

## 2015-08-13 DIAGNOSIS — I2510 Atherosclerotic heart disease of native coronary artery without angina pectoris: Secondary | ICD-10-CM | POA: Diagnosis present

## 2015-08-13 DIAGNOSIS — J45909 Unspecified asthma, uncomplicated: Secondary | ICD-10-CM | POA: Diagnosis present

## 2015-08-13 DIAGNOSIS — Z955 Presence of coronary angioplasty implant and graft: Secondary | ICD-10-CM

## 2015-08-13 DIAGNOSIS — Z79899 Other long term (current) drug therapy: Secondary | ICD-10-CM

## 2015-08-13 DIAGNOSIS — G4733 Obstructive sleep apnea (adult) (pediatric): Secondary | ICD-10-CM | POA: Diagnosis present

## 2015-08-13 DIAGNOSIS — R0602 Shortness of breath: Secondary | ICD-10-CM | POA: Diagnosis present

## 2015-08-13 DIAGNOSIS — S069XAA Unspecified intracranial injury with loss of consciousness status unknown, initial encounter: Secondary | ICD-10-CM | POA: Diagnosis present

## 2015-08-13 DIAGNOSIS — I509 Heart failure, unspecified: Secondary | ICD-10-CM | POA: Diagnosis present

## 2015-08-13 DIAGNOSIS — C3492 Malignant neoplasm of unspecified part of left bronchus or lung: Secondary | ICD-10-CM | POA: Diagnosis not present

## 2015-08-13 DIAGNOSIS — S069X9A Unspecified intracranial injury with loss of consciousness of unspecified duration, initial encounter: Secondary | ICD-10-CM | POA: Diagnosis present

## 2015-08-13 DIAGNOSIS — Z9889 Other specified postprocedural states: Secondary | ICD-10-CM

## 2015-08-13 DIAGNOSIS — I10 Essential (primary) hypertension: Secondary | ICD-10-CM | POA: Diagnosis present

## 2015-08-13 LAB — CBC WITH DIFFERENTIAL/PLATELET
Basophils Absolute: 0 10*3/uL (ref 0.0–0.1)
Basophils Relative: 0 %
EOS PCT: 1 %
Eosinophils Absolute: 0.2 10*3/uL (ref 0.0–0.7)
HEMATOCRIT: 38.2 % — AB (ref 39.0–52.0)
HEMOGLOBIN: 12.6 g/dL — AB (ref 13.0–17.0)
LYMPHS ABS: 1.8 10*3/uL (ref 0.7–4.0)
LYMPHS PCT: 12 %
MCH: 30.1 pg (ref 26.0–34.0)
MCHC: 33 g/dL (ref 30.0–36.0)
MCV: 91.4 fL (ref 78.0–100.0)
Monocytes Absolute: 1.3 10*3/uL — ABNORMAL HIGH (ref 0.1–1.0)
Monocytes Relative: 8 %
NEUTROS ABS: 11.8 10*3/uL — AB (ref 1.7–7.7)
NEUTROS PCT: 79 %
Platelets: 364 10*3/uL (ref 150–400)
RBC: 4.18 MIL/uL — AB (ref 4.22–5.81)
RDW: 15.3 % (ref 11.5–15.5)
WBC: 15.1 10*3/uL — AB (ref 4.0–10.5)

## 2015-08-13 LAB — GLUCOSE, CAPILLARY: Glucose-Capillary: 92 mg/dL (ref 65–99)

## 2015-08-13 LAB — COMPREHENSIVE METABOLIC PANEL
ALK PHOS: 131 U/L — AB (ref 38–126)
ALT: 40 U/L (ref 17–63)
AST: 56 U/L — AB (ref 15–41)
Albumin: 2.7 g/dL — ABNORMAL LOW (ref 3.5–5.0)
Anion gap: 11 (ref 5–15)
BILIRUBIN TOTAL: 1 mg/dL (ref 0.3–1.2)
BUN: 8 mg/dL (ref 6–20)
CALCIUM: 9.7 mg/dL (ref 8.9–10.3)
CO2: 31 mmol/L (ref 22–32)
CREATININE: 0.67 mg/dL (ref 0.61–1.24)
Chloride: 96 mmol/L — ABNORMAL LOW (ref 101–111)
Glucose, Bld: 116 mg/dL — ABNORMAL HIGH (ref 65–99)
Potassium: 4.4 mmol/L (ref 3.5–5.1)
Sodium: 138 mmol/L (ref 135–145)
Total Protein: 6.6 g/dL (ref 6.5–8.1)

## 2015-08-13 LAB — PROTIME-INR
INR: 1.13 (ref 0.00–1.49)
PROTHROMBIN TIME: 14.7 s (ref 11.6–15.2)

## 2015-08-13 MED ORDER — LINAGLIPTIN 5 MG PO TABS
5.0000 mg | ORAL_TABLET | Freq: Every day | ORAL | Status: DC
  Administered 2015-08-14 – 2015-08-15 (×2): 5 mg via ORAL
  Filled 2015-08-13 (×2): qty 1

## 2015-08-13 MED ORDER — SODIUM CHLORIDE 0.9% FLUSH
3.0000 mL | Freq: Two times a day (BID) | INTRAVENOUS | Status: DC
Start: 2015-08-13 — End: 2015-08-15
  Administered 2015-08-13 – 2015-08-14 (×2): 3 mL via INTRAVENOUS

## 2015-08-13 MED ORDER — OXYCODONE-ACETAMINOPHEN 5-325 MG PO TABS
2.0000 | ORAL_TABLET | Freq: Once | ORAL | Status: AC
  Administered 2015-08-13: 2 via ORAL
  Filled 2015-08-13: qty 2

## 2015-08-13 MED ORDER — SODIUM CHLORIDE 0.9% FLUSH
3.0000 mL | Freq: Two times a day (BID) | INTRAVENOUS | Status: DC
  Administered 2015-08-14 – 2015-08-15 (×2): 3 mL via INTRAVENOUS

## 2015-08-13 MED ORDER — PANTOPRAZOLE SODIUM 40 MG PO TBEC
40.0000 mg | DELAYED_RELEASE_TABLET | Freq: Every day | ORAL | Status: DC
  Administered 2015-08-14 – 2015-08-15 (×2): 40 mg via ORAL
  Filled 2015-08-13 (×2): qty 1

## 2015-08-13 MED ORDER — CARVEDILOL 3.125 MG PO TABS
3.1250 mg | ORAL_TABLET | Freq: Two times a day (BID) | ORAL | Status: DC
  Administered 2015-08-14 – 2015-08-15 (×3): 3.125 mg via ORAL
  Filled 2015-08-13 (×5): qty 1

## 2015-08-13 MED ORDER — NITROGLYCERIN 0.4 MG SL SUBL: 0.4000 mg | SUBLINGUAL_TABLET | SUBLINGUAL | Status: DC | PRN

## 2015-08-13 MED ORDER — ONDANSETRON HCL 4 MG/2ML IJ SOLN: 4.0000 mg | Freq: Four times a day (QID) | INTRAMUSCULAR | Status: DC | PRN

## 2015-08-13 MED ORDER — ZOLPIDEM TARTRATE 5 MG PO TABS
5.0000 mg | ORAL_TABLET | Freq: Every evening | ORAL | Status: DC | PRN
Start: 2015-08-13 — End: 2015-08-15
  Administered 2015-08-13 – 2015-08-15 (×2): 5 mg via ORAL
  Filled 2015-08-13 (×2): qty 1

## 2015-08-13 MED ORDER — ONDANSETRON 4 MG PO TBDP
4.0000 mg | ORAL_TABLET | Freq: Once | ORAL | Status: AC
  Administered 2015-08-13: 4 mg via ORAL
  Filled 2015-08-13: qty 1

## 2015-08-13 MED ORDER — RAMIPRIL 10 MG PO CAPS
10.0000 mg | ORAL_CAPSULE | Freq: Every day | ORAL | Status: DC
  Administered 2015-08-14 – 2015-08-15 (×2): 10 mg via ORAL
  Filled 2015-08-13 (×2): qty 1

## 2015-08-13 MED ORDER — OXYCODONE-ACETAMINOPHEN 10-325 MG PO TABS: 1.0000 | ORAL_TABLET | ORAL | Status: DC | PRN

## 2015-08-13 MED ORDER — SODIUM CHLORIDE 0.9% FLUSH: 3.0000 mL | INTRAVENOUS | Status: DC | PRN

## 2015-08-13 MED ORDER — SODIUM CHLORIDE 0.9 % IV SOLN: 250.0000 mL | INTRAVENOUS | Status: DC | PRN

## 2015-08-13 MED ORDER — CITALOPRAM HYDROBROMIDE 40 MG PO TABS
40.0000 mg | ORAL_TABLET | Freq: Every evening | ORAL | Status: DC
  Administered 2015-08-14: 40 mg via ORAL
  Filled 2015-08-13 (×3): qty 1

## 2015-08-13 MED ORDER — INSULIN ASPART 100 UNIT/ML ~~LOC~~ SOLN: 0.0000 [IU] | Freq: Three times a day (TID) | SUBCUTANEOUS | Status: DC

## 2015-08-13 MED ORDER — IBUPROFEN 800 MG PO TABS: 800.0000 mg | ORAL_TABLET | Freq: Four times a day (QID) | ORAL | Status: DC | PRN

## 2015-08-13 MED ORDER — ADULT MULTIVITAMIN W/MINERALS CH
1.0000 | ORAL_TABLET | Freq: Every day | ORAL | Status: DC
  Administered 2015-08-14 – 2015-08-15 (×2): 1 via ORAL
  Filled 2015-08-13 (×2): qty 1

## 2015-08-13 MED ORDER — ENOXAPARIN SODIUM 40 MG/0.4ML ~~LOC~~ SOLN
40.0000 mg | Freq: Every day | SUBCUTANEOUS | Status: DC
  Administered 2015-08-13 – 2015-08-14 (×2): 40 mg via SUBCUTANEOUS
  Filled 2015-08-13 (×3): qty 0.4

## 2015-08-13 MED ORDER — GLIPIZIDE 10 MG PO TABS
10.0000 mg | ORAL_TABLET | Freq: Two times a day (BID) | ORAL | Status: DC
  Administered 2015-08-14 – 2015-08-15 (×3): 10 mg via ORAL
  Filled 2015-08-13 (×5): qty 1

## 2015-08-13 MED ORDER — DICYCLOMINE HCL 20 MG PO TABS
20.0000 mg | ORAL_TABLET | Freq: Two times a day (BID) | ORAL | Status: DC | PRN
  Filled 2015-08-13: qty 1

## 2015-08-13 MED ORDER — PANTOPRAZOLE SODIUM 40 MG PO TBEC: 40.0000 mg | DELAYED_RELEASE_TABLET | Freq: Every day | ORAL | Status: DC

## 2015-08-13 MED ORDER — CALCIUM CARBONATE 1250 (500 CA) MG PO TABS
1.0000 | ORAL_TABLET | Freq: Every day | ORAL | Status: DC
Start: 2015-08-13 — End: 2015-08-15
  Administered 2015-08-14: 500 mg via ORAL
  Filled 2015-08-13 (×3): qty 1

## 2015-08-13 MED ORDER — OXYCODONE HCL 5 MG PO TABS
5.0000 mg | ORAL_TABLET | ORAL | Status: DC | PRN
  Administered 2015-08-14 – 2015-08-15 (×6): 5 mg via ORAL
  Filled 2015-08-13 (×6): qty 1

## 2015-08-13 MED ORDER — ATORVASTATIN CALCIUM 40 MG PO TABS
40.0000 mg | ORAL_TABLET | Freq: Every evening | ORAL | Status: DC
  Administered 2015-08-14: 40 mg via ORAL
  Filled 2015-08-13 (×3): qty 1

## 2015-08-13 MED ORDER — ONDANSETRON HCL 4 MG PO TABS: 4.0000 mg | ORAL_TABLET | Freq: Four times a day (QID) | ORAL | Status: DC | PRN

## 2015-08-13 MED ORDER — FUROSEMIDE 80 MG PO TABS
80.0000 mg | ORAL_TABLET | Freq: Every day | ORAL | Status: DC
  Administered 2015-08-14 – 2015-08-15 (×2): 80 mg via ORAL
  Filled 2015-08-13 (×2): qty 1

## 2015-08-13 MED ORDER — OXYCODONE-ACETAMINOPHEN 5-325 MG PO TABS
1.0000 | ORAL_TABLET | ORAL | Status: DC | PRN
  Administered 2015-08-14 – 2015-08-15 (×6): 1 via ORAL
  Filled 2015-08-13 (×6): qty 1

## 2015-08-13 MED ORDER — METFORMIN HCL 500 MG PO TABS
1000.0000 mg | ORAL_TABLET | Freq: Two times a day (BID) | ORAL | Status: DC
  Administered 2015-08-14 – 2015-08-15 (×3): 1000 mg via ORAL
  Filled 2015-08-13 (×5): qty 2

## 2015-08-13 MED ORDER — MORPHINE SULFATE (PF) 2 MG/ML IV SOLN
2.0000 mg | INTRAVENOUS | Status: DC | PRN
  Administered 2015-08-13 – 2015-08-15 (×3): 2 mg via INTRAVENOUS
  Filled 2015-08-13 (×3): qty 1

## 2015-08-13 MED ORDER — ALPRAZOLAM 0.5 MG PO TABS
0.5000 mg | ORAL_TABLET | Freq: Two times a day (BID) | ORAL | Status: DC | PRN
  Administered 2015-08-13 – 2015-08-15 (×2): 0.5 mg via ORAL
  Filled 2015-08-13 (×2): qty 1

## 2015-08-13 MED ORDER — ASPIRIN 81 MG PO CHEW
81.0000 mg | CHEWABLE_TABLET | Freq: Two times a day (BID) | ORAL | Status: DC
  Administered 2015-08-14 – 2015-08-15 (×3): 81 mg via ORAL
  Filled 2015-08-13 (×5): qty 1

## 2015-08-13 NOTE — H&P (Addendum)
[facility]                                                                                    Patient Demographics  Cody [name], is a 50 y.o. male  CSN: [id number]  MRN: [id number]  DOB - 31-Aug-1965  Admit Date - 08/13/2015  Outpatient Primary MD for the patient is Chesley Noon, MD   With History of -  Past Medical History  Diagnosis Date  . Hyperlipidemia     takes Lipitor nightly  . 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 Stented coronary artery 2000, 2010  . Heart attack ([place]) 2000  . Shortness of breath     with exertion  . Peripheral edema     takes Furosemide daily  . Pneumonia     hx of >36yr ago  . Headache(784.0)   . Arthritis   . Joint pain   . Joint swelling   . Bruises easily     takes Effient daily  . GERD (gastroesophageal reflux disease)     takes Prilosec prn  . Urinary urgency   . Nocturia   . Diabetes mellitus     takes Januvia,Metformin,and Glipizide daily  . ADD (attention deficit disorder with hyperactivity)   . Anxiety     takes Xanax bid  . Depression     takes Celexa daily  . ADD (attention deficit disorder with hyperactivity)     takes Adderall daily  . Sleep apnea     uses CPAP nightly  . Hypertension   . Coronary artery disease     last cath 1/14  . CHF (congestive heart failure) ([facility]   . Asthma   . Squamous cell carcinoma ([facility]       Past Surgical History  Procedure Laterality Date  . Knee surgery  24 yrs. ago    right  . Application of wound vac  11/20/2010    and suture of bleeding post-op wound right buttock  . Rectal surgery  11/20/2010    exc. of infection perianal area right buttock (I & D)  . Ankle arthroscopy  05/13/2011    Procedure: ANKLE ARTHROSCOPY;  Surgeon: DNinetta Lights MD;  [place] [facility]  Service: Orthopedics;  Laterality: Right;  right ankle arthroscopy with extensive debridement synovectomy and chondroplasty  . Colonoscopy    . Total knee  arthroplasty  10/13/2011    Procedure: TOTAL KNEE ARTHROPLASTY;  Surgeon: DNinetta Lights MD;  [facility]  Service: Orthopedics;  Laterality: Right;  DR MURPHY WANTS 90 MINUTES FOR THIS CASE  . Cardiac catheterization  12/05/2008    and in 2000  . Coronary angioplasty      2 stents  . Cardiac catheterization  1/14    no stent put in  . Hydradenitis excision Right 10/30/2012    Procedure: wide EXCISION HYDRADENITIS right buttock ;  Surgeon: DHarl Bowie MD;  [place] [facility]  Service: General;  Laterality: Right;  . Cholecystectomy N/A 10/11/2013    Procedure: LAPAROSCOPIC CHOLECYSTECTOMY ;  Surgeon: DHarl Bowie MD;  [place] [facility]  Service:  General;  Laterality: N/A;  . Left heart catheterization with coronary angiogram N/A 06/15/2012    Procedure: LEFT HEART CATHETERIZATION WITH CORONARY ANGIOGRAM;  Surgeon: Clent Demark, MD;  Location: [facility] CATH LAB;  Service: Cardiovascular;  Laterality: N/A;  . Video bronchoscopy with endobronchial ultrasound Left 08/05/2015    Procedure: VIDEO BRONCHOSCOPY WITH ENDOBRONCHIAL ULTRASOUND with Biopsy;  Surgeon: Collene Gobble, MD;  Location: [place];  Service: Thoracic;  Laterality: Left;    in for   Chief Complaint  Patient presents with  . Shortness of Breath  . cancer patient      HPI  Cody [name]  is a 50 y.o. male, with history of left lower lobe malignancy(squamous cell carcinoma) and significant pleural effusion status post admission for thoracentesis and biopsy last week, presenting back with increased shortness of breath and left-sided chest pain. The patient is an ex-smoker with history of coronary artery disease and congestive heart failure who was admitted originally on 2/15 /2017 for increasing shortness of breath and was found to have left pleural effusion with left lung collapse. Patient underwent thoracentesis and endobronchial ultrasound with biopsy which revealed squamous cell carcinoma. The patient was  discharged home on Friday to follow-up with Dr. Alen Blew for chemotherapy and radiation, however his symptoms worsened after discharge and he is presenting for hypoxemia and pain control. His oxygen level remained around 82% during his stay. No fever or chills but patient reports left pleuritic chest pain.    Review of Systems    In addition to the HPI above,  No Fever-chills, No Headache, No changes with Vision or hearing, No problems swallowing food or Liquids, No Abdominal pain, No Nausea or Vommitting, Bowel movements are regular, No Blood in stool or Urine, No dysuria, No new skin rashes or bruises, No new joints pains-aches,  No new weakness, tingling, numbness in any extremity, No recent weight gain or loss, No polyuria, polydypsia or polyphagia, No significant Mental Stressors.  A full 10 point Review of Systems was done, except as stated above, all other Review of Systems were negative.   Social History Social History  Substance Use Topics  . Smoking status: Current Every Day Smoker -- 1.00 packs/day for 25 years    Types: Cigarettes  . Smokeless tobacco: Never Used  . Alcohol Use: No     Family History Family History  Problem Relation Age of Onset  . Anesthesia problems Neg Hx   . Hypotension Neg Hx   . Malignant hyperthermia Neg Hx   . Pseudochol deficiency Neg Hx   . Other Mother     car accident; brain injury     Prior to Admission medications   Medication Sig Start Date End Date Taking? Authorizing Provider  albuterol (PROVENTIL HFA;VENTOLIN HFA) 108 (90 BASE) MCG/ACT inhaler Inhale 2 puffs into the lungs every 6 (six) hours as needed for wheezing.   Yes Historical Provider, MD  ALPRAZolam Duanne Moron) 1 MG tablet Take 0.5 mg by mouth 2 (two) times daily as needed for anxiety.    Yes Historical Provider, MD  aspirin 81 MG chewable tablet Chew 81 mg by mouth 2 (two) times daily.    Yes Historical Provider, MD  atorvastatin (LIPITOR) 40 MG tablet Take 40 mg by  mouth every evening.    Yes Historical Provider, MD  calcium carbonate (OS-CAL - DOSED IN MG OF ELEMENTAL CALCIUM) 1250 MG tablet Take 1 tablet by mouth at bedtime.    Yes Historical Provider, MD  carvedilol (COREG) 3.125  MG tablet Take 1 tablet (3.125 mg total) by mouth 2 (two) times daily with a meal. 12/08/12  Yes Charolette Forward, MD  citalopram (CELEXA) 40 MG tablet Take 40 mg by mouth every evening. PM   Yes Historical Provider, MD  dicyclomine (BENTYL) 20 MG tablet Take 20 mg by mouth 2 (two) times daily as needed for spasms.    Yes Historical Provider, MD  furosemide (LASIX) 40 MG tablet Take 80 mg by mouth daily. 06/11/15  Yes Historical Provider, MD  glipiZIDE (GLUCOTROL) 10 MG tablet Take 10 mg by mouth 2 (two) times daily before a meal.    Yes Historical Provider, MD  ibuprofen (ADVIL,MOTRIN) 200 MG tablet Take 800 mg by mouth every 6 (six) hours as needed for moderate pain.    Yes Historical Provider, MD  metFORMIN (GLUCOPHAGE) 1000 MG tablet Take 1,000 mg by mouth 2 (two) times daily.    Yes Historical Provider, MD  Multiple Vitamin (MULTIVITAMIN WITH MINERALS) TABS Take 1 tablet by mouth daily.   Yes Historical Provider, MD  omeprazole (PRILOSEC) 40 MG capsule Take 80 mg by mouth at bedtime.    Yes Historical Provider, MD  pantoprazole (PROTONIX) 40 MG tablet Take 40 mg by mouth daily. 07/07/15  Yes Historical Provider, MD  ramipril (ALTACE) 5 MG capsule Take 10 mg by mouth daily. 06/11/15  Yes Historical Provider, MD  sitaGLIPtin (JANUVIA) 100 MG tablet Take 100 mg by mouth every morning.    Yes Historical Provider, MD  nitroGLYCERIN (NITROSTAT) 0.4 MG SL tablet Place 1 tablet (0.4 mg total) under the tongue every 5 (five) minutes x 3 doses as needed for chest pain. 12/08/12   Charolette Forward, MD  oxyCODONE-acetaminophen (PERCOCET) 10-325 MG per tablet Take 1 tablet by mouth every 4 (four) hours as needed for pain. Patient not taking: Reported on 08/13/2015 10/11/13   Coralie Keens, MD   oxyCODONE-acetaminophen (PERCOCET) 10-325 MG tablet Take 1 tablet by mouth every 4 (four) hours as needed for pain. 08/13/15   Wyatt Portela, MD    No Known Allergies  Physical Exam  Vitals  Blood pressure 119/87, pulse 96, temperature 97.3 F (36.3 C), temperature source Oral, resp. rate 17, SpO2 96 %.   1. General well-developed, well-nourished male,  short of breath , slightly groggy  2.  Not Suicidal or Homicidal, confused.  3. No F.N deficits, grossly patient moving all extremities  4. Ears and Eyes appear Normal, Conjunctivae clear, PERRLA. Moist Oral Mucosa.  5. Supple Neck, No JVD, No cervical lymphadenopathy appriciated, No Carotid Bruits.  6. Decreased breath sounds on the left with normal breath sounds in the right and mild scattered rhonchi .  7. RRR, No Gallops, Rubs or Murmurs, No Parasternal Heave.  8. Positive Bowel Sounds, Abdomen Soft, Non tender, obese No organomegaly appriciated,No rebound -guarding or rigidity.  9.  No Cyanosis, Normal Skin Turgor, No Skin Rash or Bruise.  10. Good muscle tone,  joints appear normal , no effusions, Normal ROM.      Data Review  CBC  Recent Labs Lab 08/07/15 0509 08/13/15 1812  WBC 12.0* 15.1*  HGB 12.3* 12.6*  HCT 38.3* 38.2*  PLT 285 364  MCV 90.5 91.4  MCH 29.1 30.1  MCHC 32.1 33.0  RDW 14.8 15.3  LYMPHSABS 1.4 1.8  MONOABS 1.2* 1.3*  EOSABS 0.1 0.2  BASOSABS 0.0 0.0   ------------------------------------------------------------------------------------------------------------------  Chemistries   Recent Labs Lab 08/07/15 0509 08/13/15 1812  NA 135 138  K 4.6 4.4  CL 98* 96*  CO2 26 31  GLUCOSE 141* 116*  BUN 9 8  CREATININE 0.56* 0.67  CALCIUM 8.6* 9.7  MG 1.7  --   AST 34 56*  ALT 22 40  ALKPHOS 118 131*  BILITOT 0.8 1.0   ------------------------------------------------------------------------------------------------------------------ estimated creatinine clearance is 133  mL/min (by C-G formula based on Cr of 0.67). ------------------------------------------------------------------------------------------------------------------ No results for input(s): TSH, T4TOTAL, T3FREE, THYROIDAB in the last 72 hours.  Invalid input(s): FREET3   Coagulation profile  Recent Labs Lab 08/13/15 1812  INR 1.13   ------------------------------------------------------------------------------------------------------------------- No results for input(s): DDIMER in the last 72 hours. -------------------------------------------------------------------------------------------------------------------  Cardiac Enzymes No results for input(s): CKMB, TROPONINI, MYOGLOBIN in the last 168 hours.  Invalid input(s): CK ------------------------------------------------------------------------------------------------------------------ Invalid input(s): POCBNP   ---------------------------------------------------------------------------------------------------------------  Urinalysis    Component Value Date/Time   COLORURINE YELLOW 10/05/2011 [place] 10/05/2011 1218   LABSPEC 1.037* 10/05/2011 1218   PHURINE 6.5 10/05/2011 1218   GLUCOSEU >1000* 10/05/2011 [place] NEGATIVE 10/05/2011 [place] 10/05/2011 [place] 10/05/2011 1218   PROTEINUR NEGATIVE 10/05/2011 1218   UROBILINOGEN 1.0 10/05/2011 1218   NITRITE NEGATIVE 10/05/2011 1218   LEUKOCYTESUR NEGATIVE 10/05/2011 1218    ----------------------------------------------------------------------------------------------------------------  Imaging results:   Dg Orthopantogram  08/03/2015  CLINICAL DATA:  Left-sided jaw pain, area of tooth 23. Edema in floor of mouth. EXAM: ORTHOPANTOGRAM/PANORAMIC COMPARISON:  Limited correlation made with head CT 07/31/2015. FINDINGS: Multiple teeth are absent. Many of the maxillary teeth are fragmented. Evaluation of the midline  structures is limited by Panorex technique due to typical artifact in this area. There is possible lucency surrounding the left mandibular lateral incisor and cuspid. No evidence of acute fracture or dislocation. IMPRESSION: Probable periapical lucency surrounding the paramedian left mandibular teeth, suspicious for periodontal disease, suboptimally evaluated by Panorex technique due to artifact. Electronically Signed   By: Richardean Sale M.D.   On: 08/03/2015 19:07   Dg Chest 1 View  07/30/2015  CLINICAL DATA:  Post left thoracentesis EXAM: CHEST  1 VIEW COMPARISON:  Earlier film of the same day FINDINGS: No pneumothorax. Persistent opacification of the left hemi thorax. Can't exclude left pleural residual left pleural effusion. Narrowing of the distal left mainstem bronchus. Right lung clear. Heart size difficult to assess due to adjacent opacity. Visualized skeletal structures are unremarkable. IMPRESSION: 1. No pneumothorax post thoracentesis. 2. Persistent opacification of the left hemi thorax with left mainstem bronchus narrowing suggesting central obstructing lesion. Consider CT chest with contrast for further assessment. Electronically Signed   By: Lucrezia Europe M.D.   On: 07/30/2015 14:52   Dg Chest 2 View  08/13/2015  CLINICAL DATA:  50 year old male with recent diagnosis of lung cancer and left-sided pleural effusion. EXAM: CHEST  2 VIEW COMPARISON:  Chest radiograph dated 08/06/2015 and CT dated 07/31/2015 FINDINGS: There is complete opacification of the left hemithorax similar to the prior study. There is minimal atelectatic changes of the right main base. No focal consolidation on the right. There is no pleural effusion on the right. The left cardiac border is silhouetted of 83 large pleural effusion. No acute osseous pathology identified. IMPRESSION: Complete opacification of the left hemithorax with no significant interval change since the most recent radiograph. Electronically Signed   By: Anner Crete M.D.   On: 08/13/2015 19:08   Ct Head W Wo Contrast  07/31/2015  CLINICAL DATA:  Follow-up metastatic cancer. EXAM: CT HEAD WITHOUT AND  WITH CONTRAST TECHNIQUE: Contiguous axial images were obtained from the base of the skull through the vertex without and with intravenous contrast CONTRAST:  50 cc Omnipaque 300 COMPARISON:  MRI of the brain October 08, 2013 FINDINGS: RIGHT frontotemporal encephalomalacia. No intraparenchymal hemorrhage, mass effect, midline shift or acute large vascular territory infarcts. No abnormal insular parenchymal enhancement. No abnormal extra-axial fluid collections. 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 No abnormal extra-axial enhancement. Ocular globes and orbital contents are normal. Included paranasal sinuses and mastoid air cells are well aerated. No destructive bony lesions in the calvarium, no skull fracture. IMPRESSION: No CT findings of intracranial metastasis though, MRI with contrast is more sensitive. 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 Electronically Signed   By: Elon Alas M.D.   On: 07/31/2015 21:37   Ct Chest W Contrast  07/31/2015  CLINICAL DATA:  Pleural effusion.  Left lung mass. EXAM: CT CHEST WITH CONTRAST TECHNIQUE: Multidetector CT imaging of the chest was performed during intravenous contrast administration. CONTRAST:  90m OMNIPAQUE IOHEXOL 300 MG/ML  SOLN I was called to the evaluate the patient at 0506 hours on 07/31/2015 for complaint of contrast extravasation. Estimated volume of extravasation was about 25-30 mL. On examination, the patient was alert and oriented and in no distress. Patient was sitting in the wheelchair with ice pack over the right antecubital fossa. IV cannula was in place in the right antecubital fossa with small amount of bruising which, by history, was present prior to the extravasation. There was approximately 5-6 cm area of swelling in the antecubital fossa  anteriorly. The patient states this area was sore to palpation. No redness, blistering, or skin breakdown. Area of swelling was soft to palpation. Distal pulses and capillary refill were normal. Grip strength was normal. Patient was advised place ice packs over the area 3 times a day for the next 3 days or until resolution. Elevation of the extremity was recommended. Patient was instructed to seek medical attention if there is any change in sensation, increased pain, increased swelling, skin breakdown, blistering, or change in skin color. Contrast extravasation protocol orders were entered by the technologist. COMPARISON:  Chest radiograph 07/30/2015 FINDINGS: Normal heart size. Prominent Coronary artery calcifications. Cardiac stents are likely present. Normal caliber thoracic aorta. Lymphadenopathy in the chest with subcarinal lymph nodes measuring about 2.5 cm short axis dimension. Right superior paratracheal nodes measuring about 11 mm diameter. Esophagus is decompressed. There is a moderate left pleural effusion with collapse and consolidation of much of the left lung. Minimal residual aeration is present. The left mainstem bronchus is narrowed with probable left perihilar mass lesion. Presumed mass is obscured by the surrounding postobstructive change and effusion. Mild atelectasis in the right lung base. No pneumothorax. Included portions of the upper abdominal organs demonstrate bilateral adrenal gland nodules, measuring 2.8 cm on the right and 4.2 cm on the left. These are likely metastatic. Surgical absence of the gallbladder. Scattered soft tissue nodules in the subcutaneous fat around the liver and in the omentum may represent metastatic lymph nodes or peritoneal deposits. Degenerative changes throughout the thoracic spine. No destructive bone lesions are appreciated. Nonobstructing stones in the right kidney. IMPRESSION: Moderate left pleural effusion with collapse and consolidation of the left lung.  Narrowing of the left mainstem bronchus likely indicating an obstructing mass lesion although the mass is obscured to visualization by the surrounding postobstructive changes and effusion. Mild atelectasis in the right lung base. Probable metastatic mediastinal lymph nodes, bilateral  adrenal gland nodules, and abdominal peritoneal deposits. Note that the examination was complicated by contrast extravasation of about 25-30 cc into the right antecubital fossa. Electronically Signed   By: Lucienne Capers M.D.   On: 07/31/2015 05:55   Ct Abdomen Pelvis W Contrast  07/31/2015  CLINICAL DATA:  Metastatic cancer follow-up. EXAM: CT ABDOMEN AND PELVIS WITH CONTRAST TECHNIQUE: Multidetector CT imaging of the abdomen and pelvis was performed using the standard protocol following bolus administration of intravenous contrast. CONTRAST:  157m OMNIPAQUE IOHEXOL 300 MG/ML  SOLN COMPARISON:  11/30/2010 FINDINGS: Lower chest and abdominal wall: Known large left pleural effusion with mass and collapse in the left lower lobe and lingula. Necrotic subcarinal lymphadenopathy. Extensive coronary atherosclerotic calcification with a remote septal subendocardial infarct. Hepatobiliary: There are at least 6 low-density masses in the liver compatible with metastases. These measure 14 mm or smaller and are marked on the image for follow-up purposes.Cholecystectomy with negative common bile duct. Pancreas: Unremarkable. Spleen: Unremarkable. Adrenals/Urinary Tract: Centrally low dense bilateral adrenal masses, larger on the left- measuring up to 51 mm. Symmetric renal enhancement. No hydronephrosis Unremarkable bladder. Reproductive:No pathologic findings. Stomach/Bowel:  No obstruction Vascular/Lymphatic: There are numerous mesenteric or less likely peritoneal nodules, the largest along the right colon 4:49 measuring 21 x 28 mm. No ascites. Extensive atherosclerosis. Peritoneal: No ascites or pneumoperitoneum. Musculoskeletal: Focal  marrow lesion in the subtrochanteric left medullary cavity. No evidence of cortical involvement or fracture. 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 There is mediastinal adenopathy and malignant appearing left pleural effusion. Intra-abdominal metastases seen in the liver, adrenal glands, and mesentery. 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 Electronically Signed   By: JMonte FantasiaM.D.   On: 07/31/2015 21:54   Dg Chest Port 1 View  08/06/2015  CLINICAL DATA:  Pleural effusion. EXAM: PORTABLE CHEST 1 VIEW COMPARISON:  08/04/2015.  CT 07/31/2015 FINDINGS: Narrowing of the left mainstem bronchus again noted. Opacification of the left hemithorax is noted. Mild right base subsegmental atelectasis. Heart size difficult to assess due to opacification of the left hemi thorax. No pneumothorax. IMPRESSION: Persistent opacification of the left hemi thorax with narrowing of the left mainstem bronchus. Reference is made to prior CT report 07/31/2015. Electronically Signed   By: TMarcello Moores Register   On: 08/06/2015 07:42   Dg Chest Port 1 View  08/04/2015  CLINICAL DATA:  50year old male with left-sided pleural effusion. EXAM: PORTABLE CHEST 1 VIEW COMPARISON:  Chest radiograph dated 07/30/2015 CT dated 07/31/2015 FINDINGS: There is complete opacification of left hemithorax. The right lung is clear. There is no pneumothorax. There is persistent high-grade narrowing of the left mainstem bronchus as seen on the prior study and concerning for underlying obstructive mass. There is no significant mediastinal shift. The osseous structures are grossly unremarkable. IMPRESSION: Persistent opacification of the left hemithorax with narrowing of the left mainstem bronchus. Electronically Signed   By: AAnner CreteM.D.   On: 08/04/2015 07:39   Dg Chest Portable 1 View  07/30/2015  CLINICAL DATA:  Diminished lung sounds on the left. Shortness of breath and  pneumonia. EXAM: PORTABLE CHEST 1 VIEW COMPARISON:  12/06/2012 FINDINGS: Right chest is normal. Left hemi thorax is completely opacified consistent with a combination of pulmonary infiltrate, collapse and pleural fluid. Mediastinum is not shifted. No bony abnormality seen. IMPRESSION: Complete opacification of the left hemi thorax without mediastinal shift. This is likely due to a combination of pneumonia, atelectasis and pleural fluid. Electronically Signed  By: Nelson Chimes M.D.   On: 07/30/2015 11:13   US Thoracentesis Asp Pleural Space W/img Guide  07/30/2015  INDICATION: Shortness of breath. Recent pneumonia. Large left pleural effusion. Request for diagnostic and therapeutic thoracentesis. EXAM: ULTRASOUND GUIDED LEFT THORACENTESIS MEDICATIONS: 1% Lidocaine. COMPLICATIONS: None immediate. PROCEDURE: An ultrasound guided thoracentesis was thoroughly discussed with the patient and questions answered. The benefits, risks, alternatives and complications were also discussed. The patient understands and wishes to proceed with the procedure. Written consent was obtained. Ultrasound was performed to localize and Ravindra an adequate pocket of fluid in the left chest. The area was then prepped and draped in the normal sterile fashion. 1% Lidocaine was used for local anesthesia. Under ultrasound guidance a 6 Fr Safe-T-Centesis catheter was introduced. Thoracentesis was performed. The catheter was removed and a dressing applied. FINDINGS: A total of approximately 2 liters of amber fluid was removed. Samples were sent to the laboratory as requested by the clinical team. IMPRESSION: Successful ultrasound guided left thoracentesis yielding 2 liters of pleural fluid. Read by:  Gareth Eagle, PA-C Electronically Signed   By: Marybelle Killings M.D.   On: 07/30/2015 14:55      Assessment & Plan  1. Large left pleural effusion with hypoxemia status post thoracentesis recently on the same side     Not sure if repeat  thoracentesis would be of significant help     May need a pulmonary consult in a.m. 2. Left lower lobe squamous cell carcinoma, pending follow-up with oncology for chemotherapy and radiation 3. Leukocytosis     Probably stress-related  Plan  Admit for new oxygen needs and start on oxygen per nasal cannula Pain control with OxyIR and morphine Consult oncology in a.m. Consult pulmonary in a.m. if needed. Patient change his CODE STATUS to full at this time, he was limited code Prognosis is grave    DVT Prophylaxis Lovenox  AM Labs Ordered, also please review Full Orders  Code Status full  Disposition Plan: Home  Time spent in minutes : 37 minutes  Condition GUARDED   '@SIGNATURE'$ @

## 2015-08-13 NOTE — ED Notes (Signed)
Cody [name] (wife): [phone]

## 2015-08-13 NOTE — Telephone Encounter (Signed)
Spoke with patient's wife. Patient having pain all over, chest,back,sides and down his legs. Per dr Alen Blew, percocet [phone] script left at front for patient p/u.

## 2015-08-13 NOTE — Telephone Encounter (Signed)
Patient family member called stating that he is experiencing pain from his back to basically "all over" his body. Patient would like something for pain. Patient will have follow up on 08/15/14. Message forwarded to MD [facility].

## 2015-08-13 NOTE — ED Notes (Signed)
Report given to 5E 

## 2015-08-13 NOTE — Telephone Encounter (Signed)
Butch Penny confirmed pt's appt for 3/3

## 2015-08-13 NOTE — ED Notes (Signed)
Bed: ME26 Expected date:  Expected time:  Means of arrival:  Comments: EMS-[facility]-recent diagnosed with lung cancer

## 2015-08-13 NOTE — ED Notes (Signed)
This nurse called floor for report; told they would call back

## 2015-08-13 NOTE — ED Notes (Signed)
Per EMS, pt from home.  Pt recently was seen and treated for pneumonia.  F/U with MD office told him that he had cancer.  Pt here today for continued shortness of breath.  Worried for continual infection.  Pt completed antibiotics and was feeling better.  Vitals:  118/70, hr 88, resp 18, cbg 128.  Unable to get accurate SPO2 reading in route.

## 2015-08-14 ENCOUNTER — Inpatient Hospital Stay (HOSPITAL_COMMUNITY): Payer: Medicare HMO

## 2015-08-14 DIAGNOSIS — J9601 Acute respiratory failure with hypoxia: Secondary | ICD-10-CM

## 2015-08-14 DIAGNOSIS — G4733 Obstructive sleep apnea (adult) (pediatric): Secondary | ICD-10-CM

## 2015-08-14 DIAGNOSIS — E118 Type 2 diabetes mellitus with unspecified complications: Secondary | ICD-10-CM

## 2015-08-14 DIAGNOSIS — J90 Pleural effusion, not elsewhere classified: Secondary | ICD-10-CM

## 2015-08-14 LAB — BASIC METABOLIC PANEL
Anion gap: 12 (ref 5–15)
BUN: 8 mg/dL (ref 6–20)
CHLORIDE: 98 mmol/L — AB (ref 101–111)
CO2: 29 mmol/L (ref 22–32)
Calcium: 9.9 mg/dL (ref 8.9–10.3)
Creatinine, Ser: 0.59 mg/dL — ABNORMAL LOW (ref 0.61–1.24)
Glucose, Bld: 98 mg/dL (ref 65–99)
POTASSIUM: 4.7 mmol/L (ref 3.5–5.1)
SODIUM: 139 mmol/L (ref 135–145)

## 2015-08-14 LAB — CBC
HCT: 41.4 % (ref 39.0–52.0)
HEMOGLOBIN: 12.9 g/dL — AB (ref 13.0–17.0)
MCH: 29.7 pg (ref 26.0–34.0)
MCHC: 31.2 g/dL (ref 30.0–36.0)
MCV: 95.2 fL (ref 78.0–100.0)
PLATELETS: 358 10*3/uL (ref 150–400)
RBC: 4.35 MIL/uL (ref 4.22–5.81)
RDW: 15.6 % — ABNORMAL HIGH (ref 11.5–15.5)
WBC: 13.4 10*3/uL — AB (ref 4.0–10.5)

## 2015-08-14 LAB — BODY FLUID CELL COUNT WITH DIFFERENTIAL
LYMPHS FL: 90 %
Monocyte-Macrophage-Serous Fluid: 6 % — ABNORMAL LOW (ref 50–90)
NEUTROPHIL FLUID: 4 % (ref 0–25)
WBC FLUID: 1683 uL — AB (ref 0–1000)

## 2015-08-14 LAB — LACTATE DEHYDROGENASE, PLEURAL OR PERITONEAL FLUID: LD FL: 714 U/L — AB (ref 3–23)

## 2015-08-14 LAB — GLUCOSE, CAPILLARY
GLUCOSE-CAPILLARY: 95 mg/dL (ref 65–99)
GLUCOSE-CAPILLARY: 62 mg/dL — AB (ref 65–99)
GLUCOSE-CAPILLARY: 85 mg/dL (ref 65–99)
Glucose-Capillary: 75 mg/dL (ref 65–99)
Glucose-Capillary: 102 mg/dL — ABNORMAL HIGH (ref 65–99)

## 2015-08-14 LAB — PROTEIN, BODY FLUID: Total protein, fluid: 3.4 g/dL

## 2015-08-14 MED ORDER — IPRATROPIUM-ALBUTEROL 0.5-2.5 (3) MG/3ML IN SOLN: 3.0000 mL | Freq: Four times a day (QID) | RESPIRATORY_TRACT | Status: DC | PRN

## 2015-08-14 MED ORDER — GUAIFENESIN 100 MG/5ML PO SOLN
5.0000 mL | Freq: Four times a day (QID) | ORAL | Status: DC | PRN
  Administered 2015-08-14 – 2015-08-15 (×2): 100 mg via ORAL
  Filled 2015-08-14 (×2): qty 10

## 2015-08-14 MED ORDER — IPRATROPIUM-ALBUTEROL 0.5-2.5 (3) MG/3ML IN SOLN
3.0000 mL | RESPIRATORY_TRACT | Status: DC
  Administered 2015-08-14: 3 mL via RESPIRATORY_TRACT
  Filled 2015-08-14: qty 3

## 2015-08-14 NOTE — Progress Notes (Signed)
[facility] PROGRESS NOTE   Cody [name] ZOX:[id number] DOB: 1965-08-27 DOA: 08/13/2015 PCP: Chesley Noon, MD  HPI/Subjective: Still complaining about shortness of breath, on oxygen.  Assessment/Plan: Principal Problem:   Acute respiratory failure with hypoxia (HCC) Active Problems:   OSA (obstructive sleep apnea)   Traumatic brain injury ([place])   Recurrent left pleural effusion   Controlled diabetes mellitus type 2 with complications (HCC)    Acute respiratory failure with hypoxia This is likely secondary to the left-sided pleural effusion, and dependent atelectasis. Patient oxygen saturation is in the low 80s on room air. Currently on 2-3 L of oxygen. Chances are that he might need oxygen on discharge.  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 Obtain US guided thoracentesis, therapeutic. (His INR is 1.13 and platelets 358) If this recurs, might need further evaluation for consideration of pleurdesis versus Pleurx cath.  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 Patient to follow-up with Dr. Alen Blew of the cancer center. If patient stays in the hospital Dr. Alen Blew will follow-up with him along with the radiation oncology.  Traumatic brain injury Chronic and stable.  Controlled diabetes mellitus type 2 Hemoglobin A1c is 6.4. Carbohydrate modified diet and SSI.   Code Status: Full Code Family Communication: Plan discussed with the patient. Disposition Plan: Remains inpatient Diet: Diet Heart Room service appropriate?: Yes; Fluid consistency:: Thin  Consultants:  None  Procedures:  None  Antibiotics:  None (indicate start date, and stop date if known)   Objective: Filed Vitals:   08/13/15 2241 08/14/15 0448  BP: 120/88 109/73  Pulse: 95 108  Temp: 97.8 F (36.6 C) 97.7 F (36.5 C)  Resp: 20 20    Intake/Output Summary (Last 24 hours) at 08/14/15 1423 Last  data filed at 08/14/15 0102  Gross per 24 hour  Intake    480 ml  Output      0 ml  Net    480 ml   There were no vitals filed for this visit.  Exam: General: Alert and awake, oriented x3, not in any acute distress. HEENT: anicteric sclera, pupils reactive to light and accommodation, EOMI CVS: S1-S2 clear, no murmur rubs or gallops Chest: clear to auscultation bilaterally, no wheezing, rales or rhonchi Abdomen: soft nontender, nondistended, normal bowel sounds, no organomegaly Extremities: no cyanosis, clubbing or edema noted bilaterally Neuro: Cranial nerves II-XII intact, no focal neurological deficits  Data Reviewed: Basic Metabolic Panel:  Recent Labs Lab 08/13/15 1812 08/14/15 0533  NA 138 139  K 4.4 4.7  CL 96* 98*  CO2 31 29  GLUCOSE 116* 98  BUN 8 8  CREATININE 0.67 0.59*  CALCIUM 9.7 9.9   Liver Function Tests:  Recent Labs Lab 08/13/15 1812  AST 56*  ALT 40  ALKPHOS 131*  BILITOT 1.0  PROT 6.6  ALBUMIN 2.7*   No results for input(s): LIPASE, AMYLASE in the last 168 hours. No results for input(s): AMMONIA in the last 168 hours. CBC:  Recent Labs Lab 08/13/15 1812 08/14/15 0533  WBC 15.1* 13.4*  NEUTROABS 11.8*  --   HGB 12.6* 12.9*  HCT 38.2* 41.4  MCV 91.4 95.2  PLT 364 358   Cardiac Enzymes: No results for input(s): CKTOTAL, CKMB, CKMBINDEX, TROPONINI in the last 168 hours. BNP (last 3 results) No results for input(s): BNP in the last 8760 hours.  ProBNP (last 3 results) No results for input(s): PROBNP in the last 8760 hours.  CBG:  Recent Labs Lab 08/08/15 1125 08/13/15 2229 08/14/15 0740 08/14/15 1133 08/14/15 1206  GLUCAP 114* 92 95 62* 75    Micro No results found for this or any previous visit (from the past 240 hour(s)).   Studies: Dg Chest 2 View  08/13/2015  CLINICAL DATA:  50 year old male with recent diagnosis of lung cancer and left-sided pleural effusion. EXAM: CHEST  2 VIEW COMPARISON:  Chest radiograph dated  08/06/2015 and CT dated 07/31/2015 FINDINGS: There is complete opacification of the left hemithorax similar to the prior study. There is minimal atelectatic changes of the right main base. No focal consolidation on the right. There is no pleural effusion on the right. The left cardiac border is silhouetted of 83 large pleural effusion. No acute osseous pathology identified. IMPRESSION: Complete opacification of the left hemithorax with no significant interval change since the most recent radiograph. Electronically Signed   By: Anner Crete M.D.   On: 08/13/2015 19:08    Scheduled Meds: . aspirin  81 mg Oral BID  . atorvastatin  40 mg Oral QPM  . calcium carbonate  1 tablet Oral QHS  . carvedilol  3.125 mg Oral BID WC  . citalopram  40 mg Oral QPM  . enoxaparin (LOVENOX) injection  40 mg Subcutaneous QHS  . furosemide  80 mg Oral Daily  . glipiZIDE  10 mg Oral BID AC  . insulin aspart  0-9 Units Subcutaneous TID WC  . ipratropium-albuterol  3 mL Nebulization Q4H  . linagliptin  5 mg Oral Daily  . metFORMIN  1,000 mg Oral BID WC  . multivitamin with minerals  1 tablet Oral Daily  . pantoprazole  40 mg Oral Daily  . ramipril  10 mg Oral Daily  . sodium chloride flush  3 mL Intravenous Q12H  . sodium chloride flush  3 mL Intravenous Q12H   Continuous Infusions:      Time spent: 35 minutes    [facility] A  [facility] Pager [phone] If 7PM-7AM, please contact night-coverage at [email or web address], password [facility] 08/14/2015, 2:23 PM  LOS: 1 day

## 2015-08-14 NOTE — Procedures (Signed)
Ultrasound-guided diagnostic and therapeutic left thoracentesis performed yielding 2 liters of serosanguinous colored fluid. No immediate complications. Follow-up chest x-ray pending.       [name] E 4:44 PM 08/14/2015

## 2015-08-14 NOTE — Progress Notes (Addendum)
Hypoglycemic Event  CBG: 62  Treatment: 4 oz juice  Symptoms: weakness  Follow-up CBG: Time  12:06  CBG Result: 75  Possible Reasons for Event: lack of nutritional intake  Comments/MD notified:    Claris Pong

## 2015-08-14 NOTE — ED Provider Notes (Signed)
CSN: [id number]     Arrival date & time 08/13/15  76 History   First MD Initiated Contact with Patient 08/13/15 1734     Chief Complaint  Patient presents with  . Shortness of Breath  . cancer patient       HPI  Patient presents for evaluation of pain, and difficult breathing.  Patient presented recently to an urgent care center and found to have large left pleural effusion. Once treated inpatient. Had CT scan. Has encroachment of the large tumor on his left mainstem bronchus. Had thoracentesis of over 2 L. This immediately recurred. Was not empyema or infected. Was discharged several days ago. Has follow-up appointment with oncology in 2 days. States he had not been given any pain medicine initially upon discharge. Tylenol had been providing him adequate analgesia. For the last 2 days has had worsening left-sided pain.  No fevers. Occasional cough. Poor appetite, but no nausea or vomiting.    Past Medical History  Diagnosis Date  . Hyperlipidemia     takes Lipitor nightly  . 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 Stented coronary artery 2000, 2010  . Heart attack ([place]) 2000  . Shortness of breath     with exertion  . Peripheral edema     takes Furosemide daily  . Pneumonia     hx of >31yr ago  . Headache(784.0)   . Arthritis   . Joint pain   . Joint swelling   . Bruises easily     takes Effient daily  . GERD (gastroesophageal reflux disease)     takes Prilosec prn  . Urinary urgency   . Nocturia   . Diabetes mellitus     takes Januvia,Metformin,and Glipizide daily  . ADD (attention deficit disorder with hyperactivity)   . Anxiety     takes Xanax bid  . Depression     takes Celexa daily  . ADD (attention deficit disorder with hyperactivity)     takes Adderall daily  . Sleep apnea     uses CPAP nightly  . Hypertension   . Coronary artery disease     last cath 1/14  . CHF (congestive heart failure) ([facility] [place]   . Asthma   . Squamous cell  carcinoma ([facility]    Past Surgical History  Procedure Laterality Date  . Knee surgery  24 yrs. ago    right  . Application of wound vac  11/20/2010    and suture of bleeding post-op wound right buttock  . Rectal surgery  11/20/2010    exc. of infection perianal area right buttock (I & D)  . Ankle arthroscopy  05/13/2011    Procedure: ANKLE ARTHROSCOPY;  Surgeon: DNinetta Lights MD;  [place] [facility]  Service: Orthopedics;  Laterality: Right;  right ankle arthroscopy with extensive debridement synovectomy and chondroplasty  . Colonoscopy    . Total knee arthroplasty  10/13/2011    Procedure: TOTAL KNEE ARTHROPLASTY;  Surgeon: DNinetta Lights MD;  [place] [facility]  Service: Orthopedics;  Laterality: Right;  DR MURPHY WANTS 90 MINUTES FOR THIS CASE  . Cardiac catheterization  12/05/2008    and in 2000  . Coronary angioplasty      2 stents  . Cardiac catheterization  1/14    no stent put in  . Hydradenitis excision Right 10/30/2012    Procedure: wide EXCISION HYDRADENITIS right buttock ;  Surgeon: DHarl Bowie MD;  [facility];  Service: General;  Laterality: Right;  . Cholecystectomy N/A 10/11/2013    Procedure: LAPAROSCOPIC CHOLECYSTECTOMY ;  Surgeon: Harl Bowie, MD;  Location: [place];  Service: General;  Laterality: N/A;  . Left heart catheterization with coronary angiogram N/A 06/15/2012    Procedure: LEFT HEART CATHETERIZATION WITH CORONARY ANGIOGRAM;  Surgeon: Clent Demark, MD;  Location: [place] C[facility];  Service: Cardiovascular;  Laterality: N/A;  . Video bronchoscopy with endobronchial ultrasound Left 08/05/2015    Procedure: VIDEO BRONCHOSCOPY WITH ENDOBRONCHIAL ULTRASOUND with Biopsy;  Surgeon: Collene Gobble, MD;  Location: [facility];  Service: Thoracic;  Laterality: Left;   Family History  Problem Relation Age of Onset  . Anesthesia problems Neg Hx   . Hypotension Neg Hx   . Malignant hyperthermia Neg Hx   . Pseudochol deficiency  Neg Hx   . Other Mother     car accident; brain injury   Social History  Substance Use Topics  . Smoking status: Current Every Day Smoker -- 1.00 packs/day for 25 years    Types: Cigarettes  . Smokeless tobacco: Never Used  . Alcohol Use: No    Review of Systems  Constitutional: Negative for fever, chills, diaphoresis, appetite change and fatigue.  HENT: Negative for mouth sores, sore throat and trouble swallowing.   Eyes: Negative for visual disturbance.  Respiratory: Positive for cough and shortness of breath. Negative for chest tightness and wheezing.   Cardiovascular: Positive for chest pain.  Gastrointestinal: Negative for nausea, vomiting, abdominal pain, diarrhea and abdominal distention.  Endocrine: Negative for polydipsia, polyphagia and polyuria.  Genitourinary: Negative for dysuria, frequency and hematuria.  Musculoskeletal: Positive for back pain. Negative for gait problem.  Skin: Negative for color change, pallor and rash.  Neurological: Negative for dizziness, syncope, light-headedness and headaches.  Hematological: Does not bruise/bleed easily.  Psychiatric/Behavioral: Negative for behavioral problems and confusion.      Allergies  Review of patient's allergies indicates no known allergies.  Home Medications   Prior to Admission medications   Medication Sig Start Date End Date Taking? Authorizing Provider  albuterol (PROVENTIL HFA;VENTOLIN HFA) 108 (90 BASE) MCG/ACT inhaler Inhale 2 puffs into the lungs every 6 (six) hours as needed for wheezing.   Yes Historical Provider, MD  ALPRAZolam Duanne Moron) 1 MG tablet Take 0.5 mg by mouth 2 (two) times daily as needed for anxiety.    Yes Historical Provider, MD  aspirin 81 MG chewable tablet Chew 81 mg by mouth 2 (two) times daily.    Yes Historical Provider, MD  atorvastatin (LIPITOR) 40 MG tablet Take 40 mg by mouth every evening.    Yes Historical Provider, MD  calcium carbonate (OS-CAL - DOSED IN MG OF ELEMENTAL  CALCIUM) 1250 MG tablet Take 1 tablet by mouth at bedtime.    Yes Historical Provider, MD  carvedilol (COREG) 3.125 MG tablet Take 1 tablet (3.125 mg total) by mouth 2 (two) times daily with a meal. 12/08/12  Yes Charolette Forward, MD  citalopram (CELEXA) 40 MG tablet Take 40 mg by mouth every evening. PM   Yes Historical Provider, MD  dicyclomine (BENTYL) 20 MG tablet Take 20 mg by mouth 2 (two) times daily as needed for spasms.    Yes Historical Provider, MD  furosemide (LASIX) 40 MG tablet Take 80 mg by mouth daily. 06/11/15  Yes Historical Provider, MD  glipiZIDE (GLUCOTROL) 10 MG tablet Take 10 mg by mouth 2 (two) times daily before a meal.    Yes  Historical Provider, MD  ibuprofen (ADVIL,MOTRIN) 200 MG tablet Take 800 mg by mouth every 6 (six) hours as needed for moderate pain.    Yes Historical Provider, MD  metFORMIN (GLUCOPHAGE) 1000 MG tablet Take 1,000 mg by mouth 2 (two) times daily.    Yes Historical Provider, MD  Multiple Vitamin (MULTIVITAMIN WITH MINERALS) TABS Take 1 tablet by mouth daily.   Yes Historical Provider, MD  omeprazole (PRILOSEC) 40 MG capsule Take 80 mg by mouth at bedtime.    Yes Historical Provider, MD  pantoprazole (PROTONIX) 40 MG tablet Take 40 mg by mouth daily. 07/07/15  Yes Historical Provider, MD  ramipril (ALTACE) 5 MG capsule Take 10 mg by mouth daily. 06/11/15  Yes Historical Provider, MD  sitaGLIPtin (JANUVIA) 100 MG tablet Take 100 mg by mouth every morning.    Yes Historical Provider, MD  nitroGLYCERIN (NITROSTAT) 0.4 MG SL tablet Place 1 tablet (0.4 mg total) under the tongue every 5 (five) minutes x 3 doses as needed for chest pain. 12/08/12   Charolette Forward, MD  oxyCODONE-acetaminophen (PERCOCET) 10-325 MG per tablet Take 1 tablet by mouth every 4 (four) hours as needed for pain. Patient not taking: Reported on 08/13/2015 10/11/13   Coralie Keens, MD  oxyCODONE-acetaminophen (PERCOCET) 10-325 MG tablet Take 1 tablet by mouth every 4 (four) hours as needed for  pain. 08/13/15   Wyatt Portela, MD   BP 120/88 mmHg  Pulse 95  Temp(Src) 97.8 F (36.6 C) (Oral)  Resp 20  SpO2 98% Physical Exam  Constitutional: He is oriented to person, place, and time. He appears well-developed and well-nourished. He appears ill. No distress.  HENT:  Head: Normocephalic.  Eyes: Conjunctivae are normal. Pupils are equal, round, and reactive to light. No scleral icterus.  Neck: Normal range of motion. Neck supple. No thyromegaly present.  Cardiovascular: Normal rate and regular rhythm.  Exam reveals no gallop and no friction rub.   No murmur heard. Pulmonary/Chest: Effort normal and breath sounds normal. No respiratory distress. He has no wheezes. He has no rales.    Abdominal: Soft. Bowel sounds are normal. He exhibits no distension. There is no tenderness. There is no rebound.  Musculoskeletal: Normal range of motion.  Neurological: He is alert and oriented to person, place, and time.  Skin: Skin is warm and dry. No rash noted.  Psychiatric: He has a normal mood and affect. His behavior is normal.    ED Course  Procedures (including critical care time) Labs Review Labs Reviewed  CBC WITH DIFFERENTIAL/PLATELET - Abnormal; Notable for the following:    WBC 15.1 (*)    RBC 4.18 (*)    Hemoglobin 12.6 (*)    HCT 38.2 (*)    Neutro Abs 11.8 (*)    Monocytes Absolute 1.3 (*)    All other components within normal limits  COMPREHENSIVE METABOLIC PANEL - Abnormal; Notable for the following:    Chloride 96 (*)    Glucose, Bld 116 (*)    Albumin 2.7 (*)    AST 56 (*)    Alkaline Phosphatase 131 (*)    All other components within normal limits  PROTIME-INR  GLUCOSE, CAPILLARY  CBC  BASIC METABOLIC PANEL    Imaging Review Dg Chest 2 View  08/13/2015  CLINICAL DATA:  50 year old male with recent diagnosis of lung cancer and left-sided pleural effusion. EXAM: CHEST  2 VIEW COMPARISON:  Chest radiograph dated 08/06/2015 and CT dated 07/31/2015 FINDINGS: There  is complete opacification of the  left hemithorax similar to the prior study. There is minimal atelectatic changes of the right main base. No focal consolidation on the right. There is no pleural effusion on the right. The left cardiac border is silhouetted of 83 large pleural effusion. No acute osseous pathology identified. IMPRESSION: Complete opacification of the left hemithorax with no significant interval change since the most recent radiograph. Electronically Signed   By: Anner Crete M.D.   On: 08/13/2015 19:08   I have personally reviewed and evaluated these images and lab results as part of my medical decision-making.   EKG Interpretation None      MDM   Final diagnoses:  Hypoxemia   Patient was discharged without hypoxemia. States he did contact his physician today and has a prescription to pick up in the morning for pain medication. His chest x-ray is unchanged and shows continued complete opacification of his left hemithorax. He is hypoxemic down to 82% on room air will require admission for at least oxygenation and pain control. Discussed with the family that his physicians may or may not decide to initiate his definitive treatment with his hospitalization and express understanding of this.    Tanna Furry, MD 08/14/15 [phone]

## 2015-08-15 ENCOUNTER — Inpatient Hospital Stay: Payer: Medicare Other | Admitting: Oncology

## 2015-08-15 ENCOUNTER — Ambulatory Visit: Payer: Medicare Other | Attending: Radiation Oncology | Admitting: Radiation Oncology

## 2015-08-15 ENCOUNTER — Other Ambulatory Visit: Payer: Self-pay | Admitting: Oncology

## 2015-08-15 ENCOUNTER — Ambulatory Visit
Admission: RE | Admit: 2015-08-15 | Discharge: 2015-08-15 | Disposition: A | Discharge status: Home / Self Care | Payer: Medicare HMO | Source: Ambulatory Visit | Attending: Radiation Oncology | Admitting: Radiation Oncology

## 2015-08-15 ENCOUNTER — Telehealth: Payer: Self-pay | Admitting: Oncology

## 2015-08-15 ENCOUNTER — Telehealth: Payer: Self-pay

## 2015-08-15 DIAGNOSIS — Z510 Encounter for antineoplastic radiation therapy: Secondary | ICD-10-CM | POA: Insufficient documentation

## 2015-08-15 DIAGNOSIS — IMO0002 Reserved for concepts with insufficient information to code with codable children: Secondary | ICD-10-CM

## 2015-08-15 DIAGNOSIS — C3432 Malignant neoplasm of lower lobe, left bronchus or lung: Secondary | ICD-10-CM | POA: Insufficient documentation

## 2015-08-15 DIAGNOSIS — R0602 Shortness of breath: Secondary | ICD-10-CM

## 2015-08-15 DIAGNOSIS — C7970 Secondary malignant neoplasm of unspecified adrenal gland: Secondary | ICD-10-CM

## 2015-08-15 DIAGNOSIS — C7951 Secondary malignant neoplasm of bone: Secondary | ICD-10-CM

## 2015-08-15 DIAGNOSIS — C3492 Malignant neoplasm of unspecified part of left bronchus or lung: Secondary | ICD-10-CM

## 2015-08-15 DIAGNOSIS — C787 Secondary malignant neoplasm of liver and intrahepatic bile duct: Secondary | ICD-10-CM

## 2015-08-15 LAB — BASIC METABOLIC PANEL
Anion gap: 12 (ref 5–15)
BUN: 10 mg/dL (ref 6–20)
CALCIUM: 10.1 mg/dL (ref 8.9–10.3)
CO2: 35 mmol/L — ABNORMAL HIGH (ref 22–32)
CREATININE: 0.54 mg/dL — AB (ref 0.61–1.24)
Chloride: 92 mmol/L — ABNORMAL LOW (ref 101–111)
Glucose, Bld: 92 mg/dL (ref 65–99)
Potassium: 4.9 mmol/L (ref 3.5–5.1)
SODIUM: 139 mmol/L (ref 135–145)

## 2015-08-15 LAB — CBC
HEMATOCRIT: 42.4 % (ref 39.0–52.0)
HEMOGLOBIN: 13.6 g/dL (ref 13.0–17.0)
MCH: 30 pg (ref 26.0–34.0)
MCHC: 32.1 g/dL (ref 30.0–36.0)
MCV: 93.6 fL (ref 78.0–100.0)
Platelets: 348 10*3/uL (ref 150–400)
RBC: 4.53 MIL/uL (ref 4.22–5.81)
RDW: 15.4 % (ref 11.5–15.5)
WBC: 14.8 10*3/uL — ABNORMAL HIGH (ref 4.0–10.5)

## 2015-08-15 LAB — GLUCOSE, CAPILLARY: Glucose-Capillary: 106 mg/dL — ABNORMAL HIGH (ref 65–99)

## 2015-08-15 LAB — AMYLASE, PLEURAL FLUID: Amylase, Pleural Fluid: 40 U/L

## 2015-08-15 NOTE — Progress Notes (Signed)
SATURATION QUALIFICATIONS: (This note is used to comply with regulatory documentation for home oxygen)  Patient Saturations on Room Air at Rest = 90%  Patient Saturations on Room Air while Ambulating = 88%  Patient Saturations on 2 Liters of oxygen while Ambulating = 95%  Please briefly explain why patient needs home oxygen: Stage IV lung cancer

## 2015-08-15 NOTE — Discharge Summary (Signed)
Physician Discharge Summary  Cody [name] YJE:[id number] DOB: 11/05/1965 DOA: 08/13/2015  PCP: Cody Noon, MD  Admit date: 08/13/2015 Discharge date: 08/15/2015  Time spent: 40 minutes  Recommendations for Outpatient Follow-up:  1. Follow-up with primary care physician 1 week. 2. Follow-up with oncology next Monday.   Discharge Diagnoses:  Principal Problem:   Acute respiratory failure with hypoxia (HCC) Active Problems:   OSA (obstructive sleep apnea)   Traumatic brain injury ([facility]ody [name])   Recurrent left pleural effusion   Controlled diabetes mellitus type 2 with complications [facility])   Discharge Condition: None  Diet recommendation: None  There were no vitals filed for this visit.  History of present illness:  Trevell [name] is a 50 y.o. male, with history of left lower lobe malignancy(squamous cell carcinoma) and significant pleural effusion status post admission for thoracentesis and biopsy last week, presenting back with increased shortness of breath and left-sided chest pain. The patient is an ex-smoker with history of coronary artery disease and congestive heart failure who was admitted originally on 2/15 /2017 for increasing shortness of breath and was found to have left pleural effusion with left lung collapse. Patient underwent thoracentesis and endobronchial ultrasound with biopsy which revealed squamous cell carcinoma. The patient was discharged home on Friday to follow-up with Dr. Alen [name] for chemotherapy and radiation, however his symptoms worsened after discharge and he is presenting for hypoxemia and pain control. His oxygen level remained around 82% during his stay. No fever or chills but patient reports left pleuritic chest pain.  Hospital Course:   Acute respiratory failure with hypoxia This is likely secondary to the left-sided pleural effusion, and dependent atelectasis. Patient oxygen saturation is in the low 80s on room air. Currently on 2-3 L of oxygen. Patient  qualifies for oxygen as he desats to 88%. Oxygen prescribed.  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 Obtain US guided thoracentesis, therapeutic. (His INR is 1.13 and platelets 358). US thoracentesis done by IR and removal of 2 L of fluids If this recurs, might need further evaluation for consideration of pleurdesis versus Pleurx cath.  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 Patient to follow-up with Dr. Alen [name] of the cancer center. If patient stays in the hospital Dr. Alen [name] will follow-up with him along with the radiation oncology.  Traumatic brain injury Chronic and stable.  Controlled diabetes mellitus type 2 Hemoglobin A1c is 6.4. Carbohydrate modified diet and SSI.   Procedures:  None  Consultations:  None  Discharge Exam: Filed Vitals:   08/15/15 0450 08/15/15 1101  BP: 122/84 96/64  Pulse: 94 94  Temp: 97.5 F (36.4 C) 98.1 F (36.7 C)  Resp:    General: Alert and awake, oriented x3, not in any acute distress. HEENT: anicteric sclera, pupils reactive to light and accommodation, EOMI CVS: S1-S2 clear, no murmur rubs or gallops Chest: clear to auscultation bilaterally, no wheezing, rales or rhonchi Abdomen: soft nontender, nondistended, normal bowel sounds, no organomegaly Extremities: no cyanosis, clubbing or edema noted bilaterally Neuro: Cranial nerves II-XII intact, no focal neurological deficits   Discharge Instructions   Discharge Instructions    Diet - low sodium heart healthy    Complete by:  As directed      Increase activity slowly    Complete by:  As directed           Discharge Medication List as of 08/15/2015 10:53 AM    CONTINUE these medications which have NOT  CHANGED   Details  albuterol (PROVENTIL HFA;VENTOLIN HFA) 108 (90 BASE) MCG/ACT inhaler Inhale 2 puffs into the lungs every 6 (six) hours as needed for wheezing., Until Discontinued, Historical  Med    ALPRAZolam (XANAX) 1 MG tablet Take 0.5 mg by mouth 2 (two) times daily as needed for anxiety. , Until Discontinued, Historical Med    aspirin 81 MG chewable tablet Chew 81 mg by mouth 2 (two) times daily. , Until Discontinued, Historical Med    atorvastatin (LIPITOR) 40 MG tablet Take 40 mg by mouth every evening. , Until Discontinued, Historical Med    calcium carbonate (OS-CAL - DOSED IN MG OF ELEMENTAL CALCIUM) 1250 MG tablet Take 1 tablet by mouth at bedtime. , Until Discontinued, Historical Med    carvedilol (COREG) 3.125 MG tablet Take 1 tablet (3.125 mg total) by mouth 2 (two) times daily with a meal., Starting 12/08/2012, Until Discontinued, Print    citalopram (CELEXA) 40 MG tablet Take 40 mg by mouth every evening. PM, Until Discontinued, Historical Med    dicyclomine (BENTYL) 20 MG tablet Take 20 mg by mouth 2 (two) times daily as needed for spasms. , Until Discontinued, Historical Med    furosemide (LASIX) 40 MG tablet Take 80 mg by mouth daily., Starting 06/11/2015, Until Discontinued, Historical Med    glipiZIDE (GLUCOTROL) 10 MG tablet Take 10 mg by mouth 2 (two) times daily before a meal. , Until Discontinued, Historical Med    ibuprofen (ADVIL,MOTRIN) 200 MG tablet Take 800 mg by mouth every 6 (six) hours as needed for moderate pain. , Until Discontinued, Historical Med    metFORMIN (GLUCOPHAGE) 1000 MG tablet Take 1,000 mg by mouth 2 (two) times daily. , Until Discontinued, Historical Med    Multiple Vitamin (MULTIVITAMIN WITH MINERALS) TABS Take 1 tablet by mouth daily., Until Discontinued, Historical Med    omeprazole (PRILOSEC) 40 MG capsule Take 80 mg by mouth at bedtime. , Until Discontinued, Historical Med    pantoprazole (PROTONIX) 40 MG tablet Take 40 mg by mouth daily., Starting 07/07/2015, Until Discontinued, Historical Med    ramipril (ALTACE) 5 MG capsule Take 10 mg by mouth daily., Starting 06/11/2015, Until Discontinued, Historical Med     sitaGLIPtin (JANUVIA) 100 MG tablet Take 100 mg by mouth every morning. , Until Discontinued, Historical Med    nitroGLYCERIN (NITROSTAT) 0.4 MG SL tablet Place 1 tablet (0.4 mg total) under the tongue every 5 (five) minutes x 3 doses as needed for chest pain., Starting 12/08/2012, Until Discontinued, Print    oxyCODONE-acetaminophen (PERCOCET) 10-325 MG tablet Take 1 tablet by mouth every 4 (four) hours as needed for pain., Starting 08/13/2015, Until Discontinued, Print       No Known Allergies    The results of significant diagnostics from this hospitalization (including imaging, microbiology, ancillary and laboratory) are listed below for reference.    Significant Diagnostic Studies: Dg Orthopantogram  08/03/2015  CLINICAL DATA:  Left-sided jaw pain, area of tooth 23. Edema in floor of mouth. EXAM: ORTHOPANTOGRAM/PANORAMIC COMPARISON:  Limited correlation made with head CT 07/31/2015. FINDINGS: Multiple teeth are absent. Many of the maxillary teeth are fragmented. Evaluation of the midline structures is limited by Panorex technique due to typical artifact in this area. There is possible lucency surrounding the left mandibular lateral incisor and cuspid. No evidence of acute fracture or dislocation. IMPRESSION: Probable periapical lucency surrounding the paramedian left mandibular teeth, suspicious for periodontal disease, suboptimally evaluated by Panorex technique due to artifact. Electronically Signed  By: Richardean Sale M.D.   On: 08/03/2015 19:07   Dg Chest 1 View  08/14/2015  CLINICAL DATA:  50 year old male status post left thoracentesis. Known squamous cell carcinoma with recurrent left-sided pleural effusion. EXAM: CHEST 1 VIEW COMPARISON:  08/13/2015 FINDINGS: Continued opacification of the left hemi thorax is noted which likely represents a combination of some residual pleural fluid and left lung consolidation/atelectasis. A trace right pleural effusion is again noted. There is no  evidence of pneumothorax. IMPRESSION: No evidence of pneumothorax following left thoracentesis. No significant change of left hemi thorax opacification likely representing a combination of some residual pleural fluid and left lung consolidations/atelectasis. Electronically Signed   By: Margarette Canada M.D.   On: 08/14/2015 17:26   Dg Chest 1 View  07/30/2015  CLINICAL DATA:  Post left thoracentesis EXAM: CHEST  1 VIEW COMPARISON:  Earlier film of the same day FINDINGS: No pneumothorax. Persistent opacification of the left hemi thorax. Can't exclude left pleural residual left pleural effusion. Narrowing of the distal left mainstem bronchus. Right lung clear. Heart size difficult to assess due to adjacent opacity. Visualized skeletal structures are unremarkable. IMPRESSION: 1. No pneumothorax post thoracentesis. 2. Persistent opacification of the left hemi thorax with left mainstem bronchus narrowing suggesting central obstructing lesion. Consider CT chest with contrast for further assessment. Electronically Signed   By: Lucrezia Europe M.D.   On: 07/30/2015 14:52   Dg Chest 2 View  08/13/2015  CLINICAL DATA:  50 year old male with recent diagnosis of lung cancer and left-sided pleural effusion. EXAM: CHEST  2 VIEW COMPARISON:  Chest radiograph dated 08/06/2015 and CT dated 07/31/2015 FINDINGS: There is complete opacification of the left hemithorax similar to the prior study. There is minimal atelectatic changes of the right main base. No focal consolidation on the right. There is no pleural effusion on the right. The left cardiac border is silhouetted of 83 large pleural effusion. No acute osseous pathology identified. IMPRESSION: Complete opacification of the left hemithorax with no significant interval change since the most recent radiograph. Electronically Signed   By: Anner Crete M.D.   On: 08/13/2015 19:08   Ct Head W Wo Contrast  07/31/2015  CLINICAL DATA:  Follow-up metastatic cancer. EXAM: CT HEAD WITHOUT  AND WITH CONTRAST TECHNIQUE: Contiguous axial images were obtained from the base of the skull through the vertex without and with intravenous contrast CONTRAST:  50 cc Omnipaque 300 COMPARISON:  MRI of the brain October 08, 2013 FINDINGS: RIGHT frontotemporal encephalomalacia. No intraparenchymal hemorrhage, mass effect, midline shift or acute large vascular territory infarcts. No abnormal insular parenchymal enhancement. No abnormal extra-axial fluid collections. 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 No abnormal extra-axial enhancement. Ocular globes and orbital contents are normal. Included paranasal sinuses and mastoid air cells are well aerated. No destructive bony lesions in the calvarium, no skull fracture. IMPRESSION: No CT findings of intracranial metastasis though, MRI with contrast is more sensitive. 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 Electronically Signed   By: Elon Alas M.D.   On: 07/31/2015 21:37   Ct Chest W Contrast  07/31/2015  CLINICAL DATA:  Pleural effusion.  Left lung mass. EXAM: CT CHEST WITH CONTRAST TECHNIQUE: Multidetector CT imaging of the chest was performed during intravenous contrast administration. CONTRAST:  33m OMNIPAQUE IOHEXOL 300 MG/ML  SOLN I was called to the evaluate the patient at 0506 hours on 07/31/2015 for complaint of contrast extravasation. Estimated volume of extravasation was about 25-30 mL. On examination,  the patient was alert and oriented and in no distress. Patient was sitting in the wheelchair with ice pack over the right antecubital fossa. IV cannula was in place in the right antecubital fossa with small amount of bruising which, by history, was present prior to the extravasation. There was approximately 5-6 cm area of swelling in the antecubital fossa anteriorly. The patient states this area was sore to palpation. No redness, blistering, or skin breakdown. Area of swelling was soft to palpation.  Distal pulses and capillary refill were normal. Grip strength was normal. Patient was advised place ice packs over the area 3 times a day for the next 3 days or until resolution. Elevation of the extremity was recommended. Patient was instructed to seek medical attention if there is any change in sensation, increased pain, increased swelling, skin breakdown, blistering, or change in skin color. Contrast extravasation protocol orders were entered by the technologist. COMPARISON:  Chest radiograph 07/30/2015 FINDINGS: Normal heart size. Prominent Coronary artery calcifications. Cardiac stents are likely present. Normal caliber thoracic aorta. Lymphadenopathy in the chest with subcarinal lymph nodes measuring about 2.5 cm short axis dimension. Right superior paratracheal nodes measuring about 11 mm diameter. Esophagus is decompressed. There is a moderate left pleural effusion with collapse and consolidation of much of the left lung. Minimal residual aeration is present. The left mainstem bronchus is narrowed with probable left perihilar mass lesion. Presumed mass is obscured by the surrounding postobstructive change and effusion. Mild atelectasis in the right lung base. No pneumothorax. Included portions of the upper abdominal organs demonstrate bilateral adrenal gland nodules, measuring 2.8 cm on the right and 4.2 cm on the left. These are likely metastatic. Surgical absence of the gallbladder. Scattered soft tissue nodules in the subcutaneous fat around the liver and in the omentum may represent metastatic lymph nodes or peritoneal deposits. Degenerative changes throughout the thoracic spine. No destructive bone lesions are appreciated. Nonobstructing stones in the right kidney. IMPRESSION: Moderate left pleural effusion with collapse and consolidation of the left lung. Narrowing of the left mainstem bronchus likely indicating an obstructing mass lesion although the mass is obscured to visualization by the  surrounding postobstructive changes and effusion. Mild atelectasis in the right lung base. Probable metastatic mediastinal lymph nodes, bilateral adrenal gland nodules, and abdominal peritoneal deposits. Note that the examination was complicated by contrast extravasation of about 25-30 cc into the right antecubital fossa. Electronically Signed   By: Lucienne Capers M.D.   On: 07/31/2015 05:55   Ct Abdomen Pelvis W Contrast  07/31/2015  CLINICAL DATA:  Metastatic cancer follow-up. EXAM: CT ABDOMEN AND PELVIS WITH CONTRAST TECHNIQUE: Multidetector CT imaging of the abdomen and pelvis was performed using the standard protocol following bolus administration of intravenous contrast. CONTRAST:  122m OMNIPAQUE IOHEXOL 300 MG/ML  SOLN COMPARISON:  11/30/2010 FINDINGS: Lower chest and abdominal wall: Known large left pleural effusion with mass and collapse in the left lower lobe and lingula. Necrotic subcarinal lymphadenopathy. Extensive coronary atherosclerotic calcification with a remote septal subendocardial infarct. Hepatobiliary: There are at least 6 low-density masses in the liver compatible with metastases. These measure 14 mm or smaller and are marked on the image for follow-up purposes.Cholecystectomy with negative common bile duct. Pancreas: Unremarkable. Spleen: Unremarkable. Adrenals/Urinary Tract: Centrally low dense bilateral adrenal masses, larger on the left- measuring up to 51 mm. Symmetric renal enhancement. No hydronephrosis Unremarkable bladder. Reproductive:No pathologic findings. Stomach/Bowel:  No obstruction Vascular/Lymphatic: There are numerous mesenteric or less likely peritoneal nodules, the largest along the  right colon 4:49 measuring 21 x 28 mm. No ascites. Extensive atherosclerosis. Peritoneal: No ascites or pneumoperitoneum. Musculoskeletal: Focal marrow lesion in the subtrochanteric left medullary cavity. No evidence of cortical involvement or fracture. IMPRESSION: Advanced malignancy,  likely primary left lung bronchogenic carcinoma. There is mediastinal adenopathy and malignant appearing left pleural effusion. Intra-abdominal metastases seen in the liver, adrenal glands, and mesentery. 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 Electronically Signed   By: Monte Fantasia M.D.   On: 07/31/2015 21:54   Dg Chest Port 1 View  08/06/2015  CLINICAL DATA:  Pleural effusion. EXAM: PORTABLE CHEST 1 VIEW COMPARISON:  08/04/2015.  CT 07/31/2015 FINDINGS: Narrowing of the left mainstem bronchus again noted. Opacification of the left hemithorax is noted. Mild right base subsegmental atelectasis. Heart size difficult to assess due to opacification of the left hemi thorax. No pneumothorax. IMPRESSION: Persistent opacification of the left hemi thorax with narrowing of the left mainstem bronchus. Reference is made to prior CT report 07/31/2015. Electronically Signed   By: Marcello Moores  Register   On: 08/06/2015 07:42   Dg Chest Port 1 View  08/04/2015  CLINICAL DATA:  50 year old male with left-sided pleural effusion. EXAM: PORTABLE CHEST 1 VIEW COMPARISON:  Chest radiograph dated 07/30/2015 CT dated 07/31/2015 FINDINGS: There is complete opacification of left hemithorax. The right lung is clear. There is no pneumothorax. There is persistent high-grade narrowing of the left mainstem bronchus as seen on the prior study and concerning for underlying obstructive mass. There is no significant mediastinal shift. The osseous structures are grossly unremarkable. IMPRESSION: Persistent opacification of the left hemithorax with narrowing of the left mainstem bronchus. Electronically Signed   By: Anner Crete M.D.   On: 08/04/2015 07:39   Dg Chest Portable 1 View  07/30/2015  CLINICAL DATA:  Diminished lung sounds on the left. Shortness of breath and pneumonia. EXAM: PORTABLE CHEST 1 VIEW COMPARISON:  12/06/2012 FINDINGS: Right chest is normal. Left hemi thorax is completely opacified consistent  with a combination of pulmonary infiltrate, collapse and pleural fluid. Mediastinum is not shifted. No bony abnormality seen. IMPRESSION: Complete opacification of the left hemi thorax without mediastinal shift. This is likely due to a combination of pneumonia, atelectasis and pleural fluid. Electronically Signed   By: Nelson Chimes M.D.   On: 07/30/2015 11:13   US Thoracentesis Asp Pleural Space W/img Guide  08/14/2015  INDICATION: Patient with known squamous cell carcinoma of the lung. Recurrent left-sided pleural effusion. Request for diagnostic and therapeutic thoracentesis has been made. EXAM: ULTRASOUND GUIDED DIAGNOSTIC AND THERAPEUTIC THORACENTESIS MEDICATIONS: 1% lidocaine COMPLICATIONS: None immediate. PROCEDURE: An ultrasound guided thoracentesis was thoroughly discussed with the patient and questions answered. The benefits, risks, alternatives and complications were also discussed. The patient understands and wishes to proceed with the procedure. Written consent was obtained. Ultrasound was performed to localize and Ermias an adequate pocket of fluid in the left chest. The area was then prepped and draped in the normal sterile fashion. 1% Lidocaine was used for local anesthesia. Under ultrasound guidance a Safe-T-Centesis catheter was introduced. Thoracentesis was performed. The catheter was removed and a dressing applied. FINDINGS: A total of approximately 2 L of serosanguineous fluid was removed. Samples were sent to the laboratory as requested by the clinical team. IMPRESSION: Successful ultrasound guided diagnostic and therapeutic left thoracentesis yielding 2 L of pleural fluid. Read by: Saverio Danker, PA-C Electronically Signed   By: Aletta Edouard M.D.   On: 08/14/2015 16:48   US Thoracentesis Asp Pleural  Space W/img Guide  07/30/2015  INDICATION: Shortness of breath. Recent pneumonia. Large left pleural effusion. Request for diagnostic and therapeutic thoracentesis. EXAM: ULTRASOUND GUIDED  LEFT THORACENTESIS MEDICATIONS: 1% Lidocaine. COMPLICATIONS: None immediate. PROCEDURE: An ultrasound guided thoracentesis was thoroughly discussed with the patient and questions answered. The benefits, risks, alternatives and complications were also discussed. The patient understands and wishes to proceed with the procedure. Written consent was obtained. Ultrasound was performed to localize and Wilmer an adequate pocket of fluid in the left chest. The area was then prepped and draped in the normal sterile fashion. 1% Lidocaine was used for local anesthesia. Under ultrasound guidance a 6 Fr Safe-T-Centesis catheter was introduced. Thoracentesis was performed. The catheter was removed and a dressing applied. FINDINGS: A total of approximately 2 liters of amber fluid was removed. Samples were sent to the laboratory as requested by the clinical team. IMPRESSION: Successful ultrasound guided left thoracentesis yielding 2 liters of pleural fluid. Read by:  Gareth Eagle, PA-C Electronically Signed   By: Marybelle Killings M.D.   On: 07/30/2015 14:55    Microbiology: No results found for this or any previous visit (from the past 240 hour(s)).   Labs: Basic Metabolic Panel:  Recent Labs Lab 08/13/15 1812 08/14/15 0533 08/15/15 0606  NA 138 139 139  K 4.4 4.7 4.9  CL 96* 98* 92*  CO2 31 29 35*  GLUCOSE 116* 98 92  BUN '8 8 10  '$ CREATININE 0.67 0.59* 0.54*  CALCIUM 9.7 9.9 10.1   Liver Function Tests:  Recent Labs Lab 08/13/15 1812  AST 56*  ALT 40  ALKPHOS 131*  BILITOT 1.0  PROT 6.6  ALBUMIN 2.7*   No results for input(s): LIPASE, AMYLASE in the last 168 hours. No results for input(s): AMMONIA in the last 168 hours. CBC:  Recent Labs Lab 08/13/15 1812 08/14/15 0533 08/15/15 0606  WBC 15.1* 13.4* 14.8*  NEUTROABS 11.8*  --   --   HGB 12.6* 12.9* 13.6  HCT 38.2* 41.4 42.4  MCV 91.4 95.2 93.6  PLT 364 358 348   Cardiac Enzymes: No results for input(s): CKTOTAL, CKMB, CKMBINDEX,  TROPONINI in the last 168 hours. BNP: BNP (last 3 results) No results for input(s): BNP in the last 8760 hours.  ProBNP (last 3 results) No results for input(s): PROBNP in the last 8760 hours.  CBG:  Recent Labs Lab 08/14/15 1133 08/14/15 1206 08/14/15 1721 08/14/15 2132 08/15/15 0743  GLUCAP 62* 75 85 102* 106*       Signed:  Yojan Paskett A MD.  [facility] 08/15/2015, 1:53 PM

## 2015-08-15 NOTE — Consult Note (Signed)
Radiation Oncology         [phone] ________________________________  Name: FARUQ ROSENBERGER MRN: [id number]  Date: 08/13/2015  DOB: [birth year]  EX:BMWUXL,KGMWNUU C, MD  No ref. provider found     REFERRING PHYSICIAN: No ref. provider found   DIAGNOSIS: Stage IV non-small cell, squamous cell carcinoma of the left lung, who presented with shortness of breath and hypoxia  HISTORY OF PRESENT ILLNESS:Miachel Chauncey Cruel Cato is a 50 y.o. male seen at the request of Dr. Alen Blew for a new diagnosis of Stage IV non-small cell, squamous cell carcinoma of the left lung, who presented with shortness of breath and hypoxia. The patient reports that he was originally diagnosed after he had shortness of breath weakness and pneumonia that did not resolved. A CT scan on 07/30/2015 was then performed of the chest revealing a left-sided pleural effusion with mediastinal adenopathy, right-sided lung nodules, and a suspicious finding for endobronchial hilar mass. He was hospitalized and underwent thoracentesis which removed 2 L of fluid and cytology was negative. On 08/05/2015 and endobronchial ultrasound was performed and biopsies revealed squamous cell carcinoma. A repeat CT scan of the chest on 07/31/2015 revealed a left effusion and collapse of the left lung with narrowing of the mainstem bronchus. He did have a CT of the head did not reveal any intracranial disease. A CT scan that day also revealed up to 6 lesions in the liver measuring with the largest measuring 1.4 cm. Mesenteric adenopathy was present, and bilateral adrenal masses left greater than right measuring 5.1 cm were also identified. He presented to the hospital however again with hypoxia and was found to have a recurrent left effusion. He underwent ultrasound-guided thoracentesis yesterday which were removed 2 L of pleural fluid. We are asked to see the patient for consideration of palliative radiotherapy to prevent additional bronchial collapse.    PREVIOUS  RADIATION THERAPY: No   PAST MEDICAL HISTORY:  Past Medical History  Diagnosis Date  . Hyperlipidemia     takes Lipitor nightly  . 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 Stented coronary artery 2000, 2010  . Heart attack ([place]) 2000  . Shortness of breath     with exertion  . Peripheral edema     takes Furosemide daily  . Pneumonia     hx of >50yr ago  . Headache(784.0)   . Arthritis   . Joint pain   . Joint swelling   . Bruises easily     takes Effient daily  . GERD (gastroesophageal reflux disease)     takes Prilosec prn  . Urinary urgency   . Nocturia   . Diabetes mellitus     takes Januvia,Metformin,and Glipizide daily  . ADD (attention deficit disorder with hyperactivity)   . Anxiety     takes Xanax bid  . Depression     takes Celexa daily  . ADD (attention deficit disorder with hyperactivity)     takes Adderall daily  . Sleep apnea     uses CPAP nightly  . Hypertension   . Coronary artery disease     last cath 1/14  . [place]estive heart [place]   . Asthma   . [place]        PAST SURGICAL HISTORY: Past Surgical History  Procedure Laterality Date  . Knee surgery  24 yrs. ago    right  . Application of wound vac  11/20/2010    and  suture of bleeding post-op wound right buttock  . Rectal surgery  11/20/2010    exc. of infection perianal area right buttock (I & D)  . Ankle arthroscopy  05/13/2011    Procedure: ANKLE ARTHROSCOPY;  Surgeon: Ninetta Lights, MD;  Location: [facility];  Service: Orthopedics;  Laterality: Right;  right ankle arthroscopy with extensive debridement synovectomy and chondroplasty  . Colonoscopy    . Total knee arthroplasty  10/13/2011    Procedure: TOTAL KNEE ARTHROPLASTY;  Surgeon: Ninetta Lights, MD;  Location: [facility];  Service: Orthopedics;  Laterality: Right;  DR MURPHY WANTS 90 MINUTES FOR THIS CASE  . Cardiac catheterization  12/05/2008    and in 2000  .  Coronary angioplasty      2 stents  . Cardiac catheterization  1/14    no stent put in  . Hydradenitis excision Right 10/30/2012    Procedure: wide EXCISION HYDRADENITIS right buttock ;  Surgeon: Harl Bowie, MD;  Location: [facility];  Service: General;  Laterality: Right;  . Cholecystectomy N/A 10/11/2013    Procedure: LAPAROSCOPIC CHOLECYSTECTOMY ;  Surgeon: Harl Bowie, MD;  Location: [facility] [place];  Service: General;  Laterality: N/A;  . Left heart catheterization with coronary angiogram N/A 06/15/2012    Procedure: LEFT HEART CATHETERIZATION WITH CORONARY ANGIOGRAM;  Surgeon: Clent Demark, MD;  Location: [facility] CATH LAB;  Service: Cardiovascular;  Laterality: N/A;  . Video bronchoscopy with endobronchial ultrasound Left 08/05/2015    Procedure: VIDEO BRONCHOSCOPY WITH ENDOBRONCHIAL ULTRASOUND with Biopsy;  Surgeon: Collene Gobble, MD;  Location: [facility];  Service: Thoracic;  Laterality: Left;     FAMILY HISTORY:  Family History  Problem Relation Age of Onset  . Anesthesia problems Neg Hx   . Hypotension Neg Hx   . Malignant hyperthermia Neg Hx   . Pseudochol deficiency Neg Hx   . Other Mother     car accident; brain injury     SOCIAL HISTORY:  reports that he has been smoking Cigarettes.  He has a 25 pack-year smoking history. He has never used smokeless tobacco. He reports that he does not drink alcohol or use illicit drugs.   ALLERGIES: Review of patient's allergies indicates no known allergies.   MEDICATIONS:  Current Facility-Administered Medications  Medication Dose Route Frequency Provider Last Rate Last Dose  . 0.9 %  sodium chloride infusion  250 mL Intravenous PRN Merton Border, MD      . ALPRAZolam Duanne Moron) tablet 0.5 mg  0.5 mg Oral BID PRN Merton Border, MD   0.5 mg at 08/15/15 0002  . aspirin chewable tablet 81 mg  81 mg Oral BID Merton Border, MD   81 mg at 08/15/15 1100  . atorvastatin (LIPITOR) tablet 40 mg  40 mg Oral QPM Merton Border, MD   40 mg at  08/14/15 1847  . calcium carbonate (OS-CAL - dosed in mg of elemental calcium) tablet 500 mg of elemental calcium  1 tablet Oral QHS Merton Border, MD   500 mg of elemental calcium at 08/14/15 2104  . carvedilol (COREG) tablet 3.125 mg  3.125 mg Oral BID WC Merton Border, MD   3.125 mg at 08/15/15 0814  . citalopram (CELEXA) tablet 40 mg  40 mg Oral QPM Merton Border, MD   40 mg at 08/14/15 1848  . dicyclomine (BENTYL) tablet 20 mg  20 mg Oral BID PRN Merton Border, MD      . enoxaparin (LOVENOX) injection 40 mg  40 mg Subcutaneous QHS Merton Border, MD   40 mg at 08/14/15 2105  . furosemide (LASIX) tablet 80 mg  80 mg Oral Daily Merton Border, MD   80 mg at 08/15/15 1100  . glipiZIDE (GLUCOTROL) tablet 10 mg  10 mg Oral BID AC Merton Border, MD   10 mg at 08/15/15 0814  . guaiFENesin (ROBITUSSIN) 100 MG/5ML solution 100 mg  5 mL Oral Q6H PRN Verlee Monte, MD   100 mg at 08/15/15 0346  . ibuprofen (ADVIL,MOTRIN) tablet 800 mg  800 mg Oral Q6H PRN Merton Border, MD      . insulin aspart (novoLOG) injection 0-9 Units  0-9 Units Subcutaneous TID WC Merton Border, MD   0 Units at 08/14/15 0800  . ipratropium-albuterol (DUONEB) 0.5-2.5 (3) MG/3ML nebulizer solution 3 mL  3 mL Nebulization Q6H PRN Verlee Monte, MD      . linagliptin (TRADJENTA) tablet 5 mg  5 mg Oral Daily Merton Border, MD   5 mg at 08/15/15 1100  . metFORMIN (GLUCOPHAGE) tablet 1,000 mg  1,000 mg Oral BID WC Merton Border, MD   1,000 mg at 08/15/15 0814  . morphine 2 MG/ML injection 2 mg  2 mg Intravenous Q3H PRN Merton Border, MD   2 mg at 08/15/15 0346  . multivitamin with minerals tablet 1 tablet  1 tablet Oral Daily Merton Border, MD   1 tablet at 08/15/15 1100  . nitroGLYCERIN (NITROSTAT) SL tablet 0.4 mg  0.4 mg Sublingual Q5 Min x 3 PRN Merton Border, MD      . ondansetron [facility]) tablet 4 mg  4 mg Oral Q6H PRN Merton Border, MD       Or  . ondansetron (ZOFRAN) injection 4 mg  4 mg Intravenous Q6H PRN Merton Border, MD      . oxyCODONE-acetaminophen (PERCOCET/ROXICET)  5-325 MG per tablet 1 tablet  1 tablet Oral Q4H PRN Merton Border, MD   1 tablet at 08/15/15 1100   And  . oxyCODONE (Oxy IR/ROXICODONE) immediate release tablet 5 mg  5 mg Oral Q4H PRN Merton Border, MD   5 mg at 08/15/15 1100  . pantoprazole (PROTONIX) EC tablet 40 mg  40 mg Oral Daily Merton Border, MD   40 mg at 08/15/15 1100  . ramipril (ALTACE) capsule 10 mg  10 mg Oral Daily Merton Border, MD   10 mg at 08/15/15 1100  . sodium chloride flush (NS) 0.9 % injection 3 mL  3 mL Intravenous Q12H Merton Border, MD   3 mL at 08/15/15 1000  . sodium chloride flush (NS) 0.9 % injection 3 mL  3 mL Intravenous Q12H Merton Border, MD   3 mL at 08/14/15 2105  . sodium chloride flush (NS) 0.9 % injection 3 mL  3 mL Intravenous PRN Merton Border, MD      . zolpidem (AMBIEN) tablet 5 mg  5 mg Oral QHS PRN,MR X 1 Merton Border, MD   5 mg at 08/15/15 0002   Current Outpatient Prescriptions  Medication Sig Dispense Refill  . albuterol (PROVENTIL HFA;VENTOLIN HFA) 108 (90 BASE) MCG/ACT inhaler Inhale 2 puffs into the lungs every 6 (six) hours as needed for wheezing.    Marland Kitchen ALPRAZolam (XANAX) 1 MG tablet Take 0.5 mg by mouth 2 (two) times daily as needed for anxiety.     Marland Kitchen aspirin 81 MG chewable tablet Chew 81 mg by mouth 2 (two) times daily.     Marland Kitchen atorvastatin (LIPITOR) 40 MG tablet Take 40  mg by mouth every evening.     . calcium carbonate (OS-CAL - DOSED IN MG OF ELEMENTAL CALCIUM) 1250 MG tablet Take 1 tablet by mouth at bedtime.     . carvedilol (COREG) 3.125 MG tablet Take 1 tablet (3.125 mg total) by mouth 2 (two) times daily with a meal. 60 tablet 3  . citalopram (CELEXA) 40 MG tablet Take 40 mg by mouth every evening. PM    . dicyclomine (BENTYL) 20 MG tablet Take 20 mg by mouth 2 (two) times daily as needed for spasms.     . furosemide (LASIX) 40 MG tablet Take 80 mg by mouth daily.  3  . glipiZIDE (GLUCOTROL) 10 MG tablet Take 10 mg by mouth 2 (two) times daily before a meal.     . ibuprofen (ADVIL,MOTRIN) 200 MG tablet  Take 800 mg by mouth every 6 (six) hours as needed for moderate pain.     . metFORMIN (GLUCOPHAGE) 1000 MG tablet Take 1,000 mg by mouth 2 (two) times daily.     . Multiple Vitamin (MULTIVITAMIN WITH MINERALS) TABS Take 1 tablet by mouth daily.    Marland Kitchen omeprazole (PRILOSEC) 40 MG capsule Take 80 mg by mouth at bedtime.     . pantoprazole (PROTONIX) 40 MG tablet Take 40 mg by mouth daily.  3  . ramipril (ALTACE) 5 MG capsule Take 10 mg by mouth daily.  3  . sitaGLIPtin (JANUVIA) 100 MG tablet Take 100 mg by mouth every morning.     . nitroGLYCERIN (NITROSTAT) 0.4 MG SL tablet Place 1 tablet (0.4 mg total) under the tongue every 5 (five) minutes x 3 doses as needed for chest pain. 25 tablet 12  . oxyCODONE-acetaminophen (PERCOCET) 10-325 MG tablet Take 1 tablet by mouth every 4 (four) hours as needed for pain. 30 tablet 0     REVIEW OF SYSTEMS: On review of systems, the patient states he is not as Short of breath since his thoracentesis. He denies any chest pain. He is not experiencing any headaches, blurred vision, double vision. He denies any fevers or chills, but has lost about 25 pounds and has noticed increasing fatigue. He is not experiencing any bowel or bladder dysfunction. A complete review of systems is obtained and is otherwise negative.    PHYSICAL EXAM:  oral temperature is 98.1 F (36.7 C). His blood pressure is 96/64 and his pulse is 94. His respiration is 20 and oxygen saturation is 92%.   Pain scale 0/10 In general this is a well appearing caucasian male in no acute distress. He is alert and oriented x4 and appropriate throughout the examination. HEENT reveals that the patient is normocephalic, atraumatic. EOMs are intact. PERRLA. Skin is intact without any evidence of gross lesions. Cardiovascular exam reveals a regular rate and rhythm, no clicks rubs or murmurs are auscultated. Chest is clear to auscultation bilaterally. Lymphatic assessment is performed and does not reveal any  adenopathy in the cervical, supraclavicular, axillary, or inguinal chains. Abdomen has active bowel sounds in all quadrants and is intact. The abdomen is soft, non tender, non distended. Lower extremities are negative for pretibial pitting edema, deep calf tenderness, cyanosis or clubbing.  ECOG = 1  0 - Asymptomatic (Fully active, able to carry on all predisease activities without restriction)  1 - Symptomatic but completely ambulatory (Restricted in physically strenuous activity but ambulatory and able to carry out work of a light or sedentary nature. For example, light housework, office work)  2 - Symptomatic, <  50% in bed during the day (Ambulatory and capable of all self care but unable to carry out any work activities. Up and about more than 50% of waking hours)  3 - Symptomatic, >50% in bed, but not bedbound (Capable of only limited self-care, confined to bed or chair 50% or more of waking hours)  4 - Bedbound (Completely disabled. Cannot carry on any self-care. Totally confined to bed or chair)  5 - Death   Eustace Pen MM, Creech RH, Tormey DC, et al. [phone]). "Toxicity and response criteria of the [facility]". [place] Oncol. 5 (6): 649-55    LABORATORY DATA:  Lab Results  Component Value Date   WBC 14.8* 08/15/2015   HGB 13.6 08/15/2015   HCT 42.4 08/15/2015   MCV 93.6 08/15/2015   PLT 348 08/15/2015   Lab Results  Component Value Date   NA 139 08/15/2015   K 4.9 08/15/2015   CL 92* 08/15/2015   CO2 35* 08/15/2015   Lab Results  Component Value Date   ALT 40 08/13/2015   AST 56* 08/13/2015   ALKPHOS 131* 08/13/2015   BILITOT 1.0 08/13/2015      RADIOGRAPHY: Dg Orthopantogram  08/03/2015  CLINICAL DATA:  Left-sided jaw pain, area of tooth 23. Edema in floor of mouth. EXAM: ORTHOPANTOGRAM/PANORAMIC COMPARISON:  Limited correlation made with head CT 07/31/2015. FINDINGS: Multiple teeth are absent. Many of the maxillary teeth are fragmented.  Evaluation of the midline structures is limited by Panorex technique due to typical artifact in this area. There is possible lucency surrounding the left mandibular lateral incisor and cuspid. No evidence of acute fracture or dislocation. IMPRESSION: Probable periapical lucency surrounding the paramedian left mandibular teeth, suspicious for periodontal disease, suboptimally evaluated by Panorex technique due to artifact. Electronically Signed   By: Richardean Sale M.D.   On: 08/03/2015 19:07   Dg Chest 1 View  08/14/2015  CLINICAL DATA:  50 year old male status post left thoracentesis. Known squamous cell carcinoma with recurrent left-sided pleural effusion. EXAM: CHEST 1 VIEW COMPARISON:  08/13/2015 FINDINGS: Continued opacification of the left hemi thorax is noted which likely represents a combination of some residual pleural fluid and left lung consolidation/atelectasis. A trace right pleural effusion is again noted. There is no evidence of pneumothorax. IMPRESSION: No evidence of pneumothorax following left thoracentesis. No significant change of left hemi thorax opacification likely representing a combination of some residual pleural fluid and left lung consolidations/atelectasis. Electronically Signed   By: Margarette Canada M.D.   On: 08/14/2015 17:26   Dg Chest 1 View  07/30/2015  CLINICAL DATA:  Post left thoracentesis EXAM: CHEST  1 VIEW COMPARISON:  Earlier film of the same day FINDINGS: No pneumothorax. Persistent opacification of the left hemi thorax. Can't exclude left pleural residual left pleural effusion. Narrowing of the distal left mainstem bronchus. Right lung clear. Heart size difficult to assess due to adjacent opacity. Visualized skeletal structures are unremarkable. IMPRESSION: 1. No pneumothorax post thoracentesis. 2. Persistent opacification of the left hemi thorax with left mainstem bronchus narrowing suggesting central obstructing lesion. Consider CT chest with contrast for further  assessment. Electronically Signed   By: Lucrezia Europe M.D.   On: 07/30/2015 14:52   Dg Chest 2 View  08/13/2015  CLINICAL DATA:  50 year old male with recent diagnosis of lung cancer and left-sided pleural effusion. EXAM: CHEST  2 VIEW COMPARISON:  Chest radiograph dated 08/06/2015 and CT dated 07/31/2015 FINDINGS: There is complete opacification of the left hemithorax similar  to the prior study. There is minimal atelectatic changes of the right main base. No focal consolidation on the right. There is no pleural effusion on the right. The left cardiac border is silhouetted of 83 large pleural effusion. No acute osseous pathology identified. IMPRESSION: Complete opacification of the left hemithorax with no significant interval change since the most recent radiograph. Electronically Signed   By: Anner Crete M.D.   On: 08/13/2015 19:08   Ct Head W Wo Contrast  07/31/2015  CLINICAL DATA:  Follow-up metastatic cancer. EXAM: CT HEAD WITHOUT AND WITH CONTRAST TECHNIQUE: Contiguous axial images were obtained from the base of the skull through the vertex without and with intravenous contrast CONTRAST:  50 cc Omnipaque 300 COMPARISON:  MRI of the brain October 08, 2013 FINDINGS: RIGHT frontotemporal encephalomalacia. No intraparenchymal hemorrhage, mass effect, midline shift or acute large vascular territory infarcts. No abnormal insular parenchymal enhancement. No abnormal extra-axial fluid collections. 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 No abnormal extra-axial enhancement. Ocular globes and orbital contents are normal. Included paranasal sinuses and mastoid air cells are well aerated. No destructive bony lesions in the calvarium, no skull fracture. IMPRESSION: No CT findings of intracranial metastasis though, MRI with contrast is more sensitive. 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 Electronically Signed   By: Elon Alas M.D.   On: 07/31/2015 21:37   Ct  Chest W Contrast  07/31/2015  CLINICAL DATA:  Pleural effusion.  Left lung mass. EXAM: CT CHEST WITH CONTRAST TECHNIQUE: Multidetector CT imaging of the chest was performed during intravenous contrast administration. CONTRAST:  67m OMNIPAQUE IOHEXOL 300 MG/ML  SOLN I was called to the evaluate the patient at 0506 hours on 07/31/2015 for complaint of contrast extravasation. Estimated volume of extravasation was about 25-30 mL. On examination, the patient was alert and oriented and in no distress. Patient was sitting in the wheelchair with ice pack over the right antecubital fossa. IV cannula was in place in the right antecubital fossa with small amount of bruising which, by history, was present prior to the extravasation. There was approximately 5-6 cm area of swelling in the antecubital fossa anteriorly. The patient states this area was sore to palpation. No redness, blistering, or skin breakdown. Area of swelling was soft to palpation. Distal pulses and capillary refill were normal. Grip strength was normal. Patient was advised place ice packs over the area 3 times a day for the next 3 days or until resolution. Elevation of the extremity was recommended. Patient was instructed to seek medical attention if there is any change in sensation, increased pain, increased swelling, skin breakdown, blistering, or change in skin color. Contrast extravasation protocol orders were entered by the technologist. COMPARISON:  Chest radiograph 07/30/2015 FINDINGS: Normal heart size. Prominent Coronary artery calcifications. Cardiac stents are likely present. Normal caliber thoracic aorta. Lymphadenopathy in the chest with subcarinal lymph nodes measuring about 2.5 cm short axis dimension. Right superior paratracheal nodes measuring about 11 mm diameter. Esophagus is decompressed. There is a moderate left pleural effusion with collapse and consolidation of much of the left lung. Minimal residual aeration is present. The left  mainstem bronchus is narrowed with probable left perihilar mass lesion. Presumed mass is obscured by the surrounding postobstructive change and effusion. Mild atelectasis in the right lung base. No pneumothorax. Included portions of the upper abdominal organs demonstrate bilateral adrenal gland nodules, measuring 2.8 cm on the right and 4.2 cm on the left. These are likely metastatic. Surgical absence  of the gallbladder. Scattered soft tissue nodules in the subcutaneous fat around the liver and in the omentum may represent metastatic lymph nodes or peritoneal deposits. Degenerative changes throughout the thoracic spine. No destructive bone lesions are appreciated. Nonobstructing stones in the right kidney. IMPRESSION: Moderate left pleural effusion with collapse and consolidation of the left lung. Narrowing of the left mainstem bronchus likely indicating an obstructing mass lesion although the mass is obscured to visualization by the surrounding postobstructive changes and effusion. Mild atelectasis in the right lung base. Probable metastatic mediastinal lymph nodes, bilateral adrenal gland nodules, and abdominal peritoneal deposits. Note that the examination was complicated by contrast extravasation of about 25-30 cc into the right antecubital fossa. Electronically Signed   By: Lucienne Capers M.D.   On: 07/31/2015 05:55   Ct Abdomen Pelvis W Contrast  07/31/2015  CLINICAL DATA:  Metastatic cancer follow-up. EXAM: CT ABDOMEN AND PELVIS WITH CONTRAST TECHNIQUE: Multidetector CT imaging of the abdomen and pelvis was performed using the standard protocol following bolus administration of intravenous contrast. CONTRAST:  152m OMNIPAQUE IOHEXOL 300 MG/ML  SOLN COMPARISON:  11/30/2010 FINDINGS: Lower chest and abdominal wall: Known large left pleural effusion with mass and collapse in the left lower lobe and lingula. Necrotic subcarinal lymphadenopathy. Extensive coronary atherosclerotic calcification with a remote  septal subendocardial infarct. Hepatobiliary: There are at least 6 low-density masses in the liver compatible with metastases. These measure 14 mm or smaller and are marked on the image for follow-up purposes.Cholecystectomy with negative common bile duct. Pancreas: Unremarkable. Spleen: Unremarkable. Adrenals/Urinary Tract: Centrally low dense bilateral adrenal masses, larger on the left- measuring up to 51 mm. Symmetric renal enhancement. No hydronephrosis Unremarkable bladder. Reproductive:No pathologic findings. Stomach/Bowel:  No obstruction Vascular/Lymphatic: There are numerous mesenteric or less likely peritoneal nodules, the largest along the right colon 4:49 measuring 21 x 28 mm. No ascites. Extensive atherosclerosis. Peritoneal: No ascites or pneumoperitoneum. Musculoskeletal: Focal marrow lesion in the subtrochanteric left medullary cavity. No evidence of cortical involvement or fracture. 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 There is mediastinal adenopathy and malignant appearing left pleural effusion. Intra-abdominal metastases seen in the liver, adrenal glands, and mesentery. 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 Electronically Signed   By: JMonte FantasiaM.D.   On: 07/31/2015 21:54   Dg Chest Port 1 View  08/06/2015  CLINICAL DATA:  Pleural effusion. EXAM: PORTABLE CHEST 1 VIEW COMPARISON:  08/04/2015.  CT 07/31/2015 FINDINGS: Narrowing of the left mainstem bronchus again noted. Opacification of the left hemithorax is noted. Mild right base subsegmental atelectasis. Heart size difficult to assess due to opacification of the left hemi thorax. No pneumothorax. IMPRESSION: Persistent opacification of the left hemi thorax with narrowing of the left mainstem bronchus. Reference is made to prior CT report 07/31/2015. Electronically Signed   By: TMarcello Moores Register   On: 08/06/2015 07:42   Dg Chest Port 1 View  08/04/2015  CLINICAL  DATA:  50year old male with left-sided pleural effusion. EXAM: PORTABLE CHEST 1 VIEW COMPARISON:  Chest radiograph dated 07/30/2015 CT dated 07/31/2015 FINDINGS: There is complete opacification of left hemithorax. The right lung is clear. There is no pneumothorax. There is persistent high-grade narrowing of the left mainstem bronchus as seen on the prior study and concerning for underlying obstructive mass. There is no significant mediastinal shift. The osseous structures are grossly unremarkable. IMPRESSION: Persistent opacification of the left hemithorax with narrowing of the left mainstem bronchus. Electronically Signed   By: AAnner Crete  M.D.   On: 08/04/2015 07:39   Dg Chest Portable 1 View  07/30/2015  CLINICAL DATA:  Diminished lung sounds on the left. Shortness of breath and pneumonia. EXAM: PORTABLE CHEST 1 VIEW COMPARISON:  12/06/2012 FINDINGS: Right chest is normal. Left hemi thorax is completely opacified consistent with a combination of pulmonary infiltrate, collapse and pleural fluid. Mediastinum is not shifted. No bony abnormality seen. IMPRESSION: Complete opacification of the left hemi thorax without mediastinal shift. This is likely due to a combination of pneumonia, atelectasis and pleural fluid. Electronically Signed   By: Nelson Chimes M.D.   On: 07/30/2015 11:13   US Thoracentesis Asp Pleural Space W/img Guide  08/14/2015  INDICATION: Patient with known squamous cell carcinoma of the lung. Recurrent left-sided pleural effusion. Request for diagnostic and therapeutic thoracentesis has been made. EXAM: ULTRASOUND GUIDED DIAGNOSTIC AND THERAPEUTIC THORACENTESIS MEDICATIONS: 1% lidocaine COMPLICATIONS: None immediate. PROCEDURE: An ultrasound guided thoracentesis was thoroughly discussed with the patient and questions answered. The benefits, risks, alternatives and complications were also discussed. The patient understands and wishes to proceed with the procedure. Written consent was  obtained. Ultrasound was performed to localize and Rodel an adequate pocket of fluid in the left chest. The area was then prepped and draped in the normal sterile fashion. 1% Lidocaine was used for local anesthesia. Under ultrasound guidance a Safe-T-Centesis catheter was introduced. Thoracentesis was performed. The catheter was removed and a dressing applied. FINDINGS: A total of approximately 2 L of serosanguineous fluid was removed. Samples were sent to the laboratory as requested by the clinical team. IMPRESSION: Successful ultrasound guided diagnostic and therapeutic left thoracentesis yielding 2 L of pleural fluid. Read by: Saverio Danker, PA-C Electronically Signed   By: Aletta Edouard M.D.   On: 08/14/2015 16:48   US Thoracentesis Asp Pleural Space W/img Guide  07/30/2015  INDICATION: Shortness of breath. Recent pneumonia. Large left pleural effusion. Request for diagnostic and therapeutic thoracentesis. EXAM: ULTRASOUND GUIDED LEFT THORACENTESIS MEDICATIONS: 1% Lidocaine. COMPLICATIONS: None immediate. PROCEDURE: An ultrasound guided thoracentesis was thoroughly discussed with the patient and questions answered. The benefits, risks, alternatives and complications were also discussed. The patient understands and wishes to proceed with the procedure. Written consent was obtained. Ultrasound was performed to localize and Teruo an adequate pocket of fluid in the left chest. The area was then prepped and draped in the normal sterile fashion. 1% Lidocaine was used for local anesthesia. Under ultrasound guidance a 6 Fr Safe-T-Centesis catheter was introduced. Thoracentesis was performed. The catheter was removed and a dressing applied. FINDINGS: A total of approximately 2 liters of amber fluid was removed. Samples were sent to the laboratory as requested by the clinical team. IMPRESSION: Successful ultrasound guided left thoracentesis yielding 2 liters of pleural fluid. Read by:  Gareth Eagle, PA-C Electronically  Signed   By: Marybelle Killings M.D.   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   PLAN: Dr. Lisbeth Renshaw and I have discussed the patient's case and he has evaluated the patient's chart and imaging. At this point in time clinically the patient also appears stable to proceed with radiotherapy to the left lung as an outpatient. He will return on Monday, 08/18/15 at 9:30 to our office and for simulation following this. We discussed the role of palliative radiotherapy to help shrink the size of the tumor and subsequently improve his symptoms of  shortness of breath. We will plan on 10 fractions to a total dose of 30Gy. We discussed the risks, benefits, anticipated side effects, and scheduling involved with radiation, and the patient states agreement and understanding of this plan. The patient will follow up with Dr. Alen Blew as well for further discussion and disposition of chemotherapy administration.     Carola Rhine, PAC

## 2015-08-15 NOTE — Progress Notes (Signed)
Cody [name] to be D/C'd Home per MD order.  Discussed prescriptions and follow up appointments with the patient. Prescriptions given to patient, medication list explained in detail. Pt verbalized understanding.    Medication List    TAKE these medications        albuterol 108 (90 Base) MCG/ACT inhaler  Commonly known as:  PROVENTIL HFA;VENTOLIN HFA  Inhale 2 puffs into the lungs every 6 (six) hours as needed for wheezing.     ALPRAZolam 1 MG tablet  Commonly known as:  XANAX  Take 0.5 mg by mouth 2 (two) times daily as needed for anxiety.     aspirin 81 MG chewable tablet  Chew 81 mg by mouth 2 (two) times daily.     atorvastatin 40 MG tablet  Commonly known as:  LIPITOR  Take 40 mg by mouth every evening.     calcium carbonate 1250 (500 Ca) MG tablet  Commonly known as:  OS-CAL - dosed in mg of elemental calcium  Take 1 tablet by mouth at bedtime.     carvedilol 3.125 MG tablet  Commonly known as:  COREG  Take 1 tablet (3.125 mg total) by mouth 2 (two) times daily with a meal.     citalopram 40 MG tablet  Commonly known as:  CELEXA  Take 40 mg by mouth every evening. PM     dicyclomine 20 MG tablet  Commonly known as:  BENTYL  Take 20 mg by mouth 2 (two) times daily as needed for spasms.     furosemide 40 MG tablet  Commonly known as:  LASIX  Take 80 mg by mouth daily.     glipiZIDE 10 MG tablet  Commonly known as:  GLUCOTROL  Take 10 mg by mouth 2 (two) times daily before a meal.     ibuprofen 200 MG tablet  Commonly known as:  ADVIL,MOTRIN  Take 800 mg by mouth every 6 (six) hours as needed for moderate pain.     metFORMIN 1000 MG tablet  Commonly known as:  GLUCOPHAGE  Take 1,000 mg by mouth 2 (two) times daily.     multivitamin with minerals Tabs tablet  Take 1 tablet by mouth daily.     nitroGLYCERIN 0.4 MG SL tablet  Commonly known as:  NITROSTAT  Place 1 tablet (0.4 mg total) under the tongue every 5 (five) minutes x 3 doses as needed for chest pain.      omeprazole 40 MG capsule  Commonly known as:  PRILOSEC  Take 80 mg by mouth at bedtime.     oxyCODONE-acetaminophen 10-325 MG tablet  Commonly known as:  PERCOCET  Take 1 tablet by mouth every 4 (four) hours as needed for pain.     pantoprazole 40 MG tablet  Commonly known as:  PROTONIX  Take 40 mg by mouth daily.     ramipril 5 MG capsule  Commonly known as:  ALTACE  Take 10 mg by mouth daily.     sitaGLIPtin 100 MG tablet  Commonly known as:  JANUVIA  Take 100 mg by mouth every morning.        Filed Vitals:   08/15/15 0450 08/15/15 1101  BP: 122/84 96/64  Pulse: 94 94  Temp: 97.5 F (36.4 C) 98.1 F (36.7 C)  Resp:      Skin clean, dry and intact without evidence of skin break down, no evidence of skin tears noted. IV catheter discontinued intact. Site without signs and symptoms of complications. Dressing and  pressure applied. Pt denies pain at this time. No complaints noted.  An After Visit Summary was printed and given to the patient. Patient escorted via [place], and D/C home via private auto.  Nonie Hoyer S 08/15/2015 12:08 PM

## 2015-08-15 NOTE — Telephone Encounter (Signed)
Patient's wife Butch Penny called stating that patient was released from the hospital today.  [facility] was supposed to deliver oxygen to patient's home today following his discharge from the hospital, they called and told Butch Penny    they would not be able to deliver until tomorrow.  Patient has lung cancer and according to hospital documentation needs to be on oxygen.  Writer called [facility] and writer is not sure what the mix up seems to be however oxygen will be delivered this evening to patient's home. Writer called and spoke with patient's wife who was relieved that the oxygen would be delivered today.

## 2015-08-15 NOTE — Progress Notes (Signed)
IP PROGRESS NOTE  Subjective:   Cody [name] feels better this morning and appears comfortable sitting in chair. He is status post thoracentesis which helped his breathing. He still report some dyspnea on exertion. Does not report any chest pain or palpitation. Has not reported any fevers or chills.  Objective:  Vital signs in last 24 hours: Temp:  [97.5 F (36.4 C)-99.1 F (37.3 C)] 97.5 F (36.4 C) (03/03 0450) Pulse Rate:  [94-105] 94 (03/03 0450) Resp:  [20] 20 (03/02 2005) BP: (96-122)/(70-84) 122/84 mmHg (03/03 0450) SpO2:  [97 %-100 %] 98 % (03/03 0450) Weight change:  Last BM Date: 08/13/15  Intake/Output from previous day: 03/02 0701 - 03/03 0700 In: 240 [P.O.:240] Out: -  Alert, awake gentleman. Without distress. Mouth: mucous membranes moist, pharynx normal without lesions Resp: Decreased breath sounds on the left side. Scattered wheezes on the right. Cardio: regular rate and rhythm, S1, S2 normal, no murmur, click, rub or gallop GI: soft, non-tender; bowel sounds normal; no masses,  no organomegaly Extremities: extremities normal, atraumatic, no cyanosis or edema    Lab Results:  Recent Labs  08/14/15 0533 08/15/15 0606  WBC 13.4* 14.8*  HGB 12.9* 13.6  HCT 41.4 42.4  PLT 358 348    BMET  Recent Labs  08/14/15 0533 08/15/15 0606  NA 139 139  K 4.7 4.9  CL 98* 92*  CO2 29 35*  GLUCOSE 98 92  BUN 8 10  CREATININE 0.59* 0.54*  CALCIUM 9.9 10.1    Studies/Results: Dg Chest 1 View  08/14/2015  CLINICAL DATA:  50 year old male status post left thoracentesis. Known squamous cell carcinoma with recurrent left-sided pleural effusion. EXAM: CHEST 1 VIEW COMPARISON:  08/13/2015 FINDINGS: Continued opacification of the left hemi thorax is noted which likely represents a combination of some residual pleural fluid and left lung consolidation/atelectasis. A trace right pleural effusion is again noted. There is no evidence of pneumothorax. IMPRESSION: No evidence  of pneumothorax following left thoracentesis. No significant change of left hemi thorax opacification likely representing a combination of some residual pleural fluid and left lung consolidations/atelectasis. Electronically Signed   By: Margarette Canada M.D.   On: 08/14/2015 17:26   Dg Chest 2 View  08/13/2015  CLINICAL DATA:  51 year old male with recent diagnosis of lung cancer and left-sided pleural effusion. EXAM: CHEST  2 VIEW COMPARISON:  Chest radiograph dated 08/06/2015 and CT dated 07/31/2015 FINDINGS: There is complete opacification of the left hemithorax similar to the prior study. There is minimal atelectatic changes of the right main base. No focal consolidation on the right. There is no pleural effusion on the right. The left cardiac border is silhouetted of 83 large pleural effusion. No acute osseous pathology identified. IMPRESSION: Complete opacification of the left hemithorax with no significant interval change since the most recent radiograph. Electronically Signed   By: Anner Crete M.D.   On: 08/13/2015 19:08   US Thoracentesis Asp Pleural Space W/img Guide  08/14/2015  INDICATION: Patient with known squamous cell carcinoma of the lung. Recurrent left-sided pleural effusion. Request for diagnostic and therapeutic thoracentesis has been made. EXAM: ULTRASOUND GUIDED DIAGNOSTIC AND THERAPEUTIC THORACENTESIS MEDICATIONS: 1% lidocaine COMPLICATIONS: None immediate. PROCEDURE: An ultrasound guided thoracentesis was thoroughly discussed with the patient and questions answered. The benefits, risks, alternatives and complications were also discussed. The patient understands and wishes to proceed with the procedure. Written consent was obtained. Ultrasound was performed to localize and Cody [name] an adequate pocket of fluid in the left chest.  The area was then prepped and draped in the normal sterile fashion. 1% Lidocaine was used for local anesthesia. Under ultrasound guidance a Safe-T-Centesis catheter  was introduced. Thoracentesis was performed. The catheter was removed and a dressing applied. FINDINGS: A total of approximately 2 L of serosanguineous fluid was removed. Samples were sent to the laboratory as requested by the clinical team. IMPRESSION: Successful ultrasound guided diagnostic and therapeutic left thoracentesis yielding 2 L of pleural fluid. Read by: Saverio Danker, PA-C Electronically Signed   By: Aletta Edouard M.D.   On: 08/14/2015 16:48    Medications: I have reviewed the patient's current medications.  Assessment/Plan:  50 year old gentleman with the following issues:  1. Stage IV squamous cell carcinoma of the lung diagnosed in February 2017. He presented with a left lung endobronchial lesion with metastatic disease to multiple areas including bone, adrenals and liver.  His PDL 1 testing still pending at this time to determine the best course of therapy but certainly he will require systemic therapy in the near future.  2. Shortness of breath: Likely related to her left lung collapse or impending collapse. Because of his endobronchial lesion he would benefit from radiation therapy to that tumor to facilitate adequate bleeding and prevent total lung collapse in the future.  He is also status post thoracentesis which have helped his symptoms.  I recommend discharged home with oxygen given his low oxygen saturations with exertion.  3. Follow-up: I will arrange follow-up for him as an outpatient setting after discharge. I have no objections to discharge today and he does not need to make his appointment at the [facility] at 2:00 as I have already evaluated him for today.   LOS: 2 days   [facility] 08/15/2015, 7:46 AM

## 2015-08-15 NOTE — Telephone Encounter (Signed)
s.w. pt and advised on March appt....pt ok and aware °

## 2015-08-15 NOTE — Progress Notes (Signed)
[facility]  Home oxygen to be delivered to patient's home.  Per case manager, patient did not need a portable to get home with.   Linward Headland 08/15/2015, 12:06 PM

## 2015-08-15 NOTE — Progress Notes (Signed)
Inpatient Diabetes Program Recommendations  AACE/ADA: New Consensus Statement on Inpatient Glycemic Control (2015)  Target Ranges:  Prepandial:   less than 140 mg/dL      Peak postprandial:   less than 180 mg/dL (1-2 hours)      Critically ill patients:  140 - 180 mg/dL   Results for Cody [name], Cody [name] (MRN [id number]) as of 08/15/2015 09:36  Ref. Range 08/14/2015 07:40 08/14/2015 11:33 08/14/2015 12:06 08/14/2015 17:21 08/14/2015 21:32  Glucose-Capillary Latest Ref Range: 65-99 mg/dL 95 62 (L) 75 85 102 (H)    Home DM Meds: Januvia 100 mg daily       Glipizide 10 mg bid       Metformin 1000 mg bid  Current Insulin Orders: Tradjenta 5 mg daily      Glipizide 10 mg bid                 Metformin 1000 mg bid      Novolog Sensitive Correction Scale/ SSI (0-9 units) TID AC     MD- If patient not discharged home today, please consider discontinuing Glipizide 10 mg bid for now.  Patient with Hypoglycemic event yesterday at lunch time.      --Will follow patient during hospitalization--  Wyn Quaker RN, MSN, CDE Diabetes Coordinator Inpatient Glycemic Control Team Team Pager: [phone] (8a-5p)

## 2015-08-18 ENCOUNTER — Encounter (HOSPITAL_COMMUNITY): Payer: Self-pay

## 2015-08-18 ENCOUNTER — Ambulatory Visit
Admission: RE | Admit: 2015-08-18 | Discharge: 2015-08-18 | Disposition: A | Discharge status: Home / Self Care | Payer: Medicare HMO | Source: Ambulatory Visit | Attending: Radiation Oncology | Admitting: Radiation Oncology

## 2015-08-18 ENCOUNTER — Encounter: Payer: Self-pay | Admitting: *Deleted

## 2015-08-18 ENCOUNTER — Other Ambulatory Visit: Payer: Self-pay | Admitting: *Deleted

## 2015-08-18 ENCOUNTER — Institutional Professional Consult (permissible substitution): Payer: Medicare Other | Admitting: Radiation Oncology

## 2015-08-18 ENCOUNTER — Other Ambulatory Visit: Payer: Self-pay | Admitting: Radiation Oncology

## 2015-08-18 ENCOUNTER — Ambulatory Visit
Admit: 2015-08-18 | Discharge: 2015-08-18 | Disposition: A | Discharge status: Home / Self Care | Payer: Medicare HMO | Attending: Radiation Oncology | Admitting: Radiation Oncology

## 2015-08-18 DIAGNOSIS — IMO0002 Reserved for concepts with insufficient information to code with codable children: Secondary | ICD-10-CM

## 2015-08-18 DIAGNOSIS — J90 Pleural effusion, not elsewhere classified: Secondary | ICD-10-CM

## 2015-08-18 DIAGNOSIS — Z510 Encounter for antineoplastic radiation therapy: Secondary | ICD-10-CM | POA: Diagnosis present

## 2015-08-18 DIAGNOSIS — C3432 Malignant neoplasm of lower lobe, left bronchus or lung: Secondary | ICD-10-CM | POA: Diagnosis present

## 2015-08-18 DIAGNOSIS — I5032 Chronic diastolic (congestive) heart failure: Secondary | ICD-10-CM

## 2015-08-18 DIAGNOSIS — J9601 Acute respiratory failure with hypoxia: Secondary | ICD-10-CM

## 2015-08-18 MED ORDER — OXYCODONE HCL ER 10 MG PO T12A: 10.0000 mg | EXTENDED_RELEASE_TABLET | Freq: Two times a day (BID) | ORAL | Status: DC

## 2015-08-18 NOTE — Progress Notes (Signed)
E[facility] and [facility], left message on lauren mullis' VM to discuss POA with patient, while seeing him today, per dr Alen Blew

## 2015-08-18 NOTE — Progress Notes (Signed)
  Radiation Oncology         [phone] ________________________________  Name: Cody [name] MRN: [id number]  Date: 08/18/2015  DOB: [birth year]  SIMULATION AND TREATMENT PLANNING NOTE  DIAGNOSIS:     ICD-9-CM ICD-10-CM   1. Primary cancer of left lower lobe of lung (HCC) 162.5 C34.32      Site:  Left hilum/ mediastinum  NARRATIVE:  The patient was brought to the [facility].  Identity was confirmed.  All relevant records and images related to the planned course of therapy were reviewed.   Written consent to proceed with treatment was confirmed which was freely given after reviewing the details related to the planned course of therapy had been reviewed with the patient.  Then, the patient was set-up in a stable reproducible  supine position for radiation therapy.  CT images were obtained.  Surface markings were placed.     The CT images were loaded into the planning software.  Then the target and avoidance structures were contoured.  Treatment planning then occurred.  The radiation prescription was entered and confirmed.  A total of 3 complex treatment devices were fabricated which relate to the designed radiation treatment fields. Each of these customized fields/ complex treatment devices will be used on a daily basis during the radiation course. I have requested : 3D Simulation  I have requested a DVH of the following structures: Target volume, spinal cord, lungs.   The patient will undergo daily image guidance to ensure accurate localization of the target, and adequate minimize dose to the normal surrounding structures in close proximity to the target.   PLAN:  The patient will receive 30 Gy in 10 fractions.  ________________________________   Jodelle Gross, MD, PhD

## 2015-08-18 NOTE — Progress Notes (Signed)
Ct simulation for chest completd, patienj tpain in low back 8/10, new rx Oxycontin '10mg'$  q 12h to be picked up today, non productive  Coughing, appetite poor, wek and fatigued,  Sob 10:39 AM BP 100/62 mmHg  Pulse 98  Temp(Src) 98.6 F (37 C) (Oral)  Resp 16  SpO2 100%  Wt Readings from Last 3 Encounters:  08/08/15 228 lb 2.8 oz (103.5 kg)  06/07/15 215 lb (97.523 kg)  10/30/13 243 lb 6.4 oz (110.406 kg)

## 2015-08-18 NOTE — Progress Notes (Signed)
Per dr Vanessa Starke bed ordered from [facility].

## 2015-08-19 ENCOUNTER — Other Ambulatory Visit: Payer: Self-pay | Admitting: Radiation Oncology

## 2015-08-19 ENCOUNTER — Ambulatory Visit: Payer: Medicare HMO | Admitting: Radiation Oncology

## 2015-08-19 ENCOUNTER — Telehealth: Payer: Self-pay | Admitting: *Deleted

## 2015-08-19 DIAGNOSIS — Z510 Encounter for antineoplastic radiation therapy: Secondary | ICD-10-CM | POA: Diagnosis not present

## 2015-08-19 MED ORDER — MORPHINE SULFATE ER 15 MG PO TBCR: 15.0000 mg | EXTENDED_RELEASE_TABLET | Freq: Two times a day (BID) | ORAL | Status: DC

## 2015-08-19 NOTE — Telephone Encounter (Signed)
Spouse Cody [name] called reporting "pain med not covered by insurance.  He needs something ASAP.  He is in terrible pain.   The pharmacy said he needs something else or re-code what was ordered for insurance."  Observed PA for RT ordered MS Contin to replace Oxycontin.  RT collaborative nurse notified.  This nurse informed Cody [name] the new script is ready for pick-up.

## 2015-08-20 ENCOUNTER — Ambulatory Visit: Admission: RE | Admit: 2015-08-20 | Payer: Medicare Other | Source: Ambulatory Visit | Admitting: Radiation Oncology

## 2015-08-20 ENCOUNTER — Emergency Department (HOSPITAL_COMMUNITY): Payer: Medicare HMO

## 2015-08-20 ENCOUNTER — Telehealth: Payer: Self-pay | Admitting: Oncology

## 2015-08-20 ENCOUNTER — Emergency Department (HOSPITAL_COMMUNITY)
Admission: EM | Admit: 2015-08-20 | Discharge: 2015-08-20 | Disposition: A | Discharge status: Home / Self Care | Payer: Medicare HMO | Attending: Emergency Medicine | Admitting: Emergency Medicine

## 2015-08-20 ENCOUNTER — Other Ambulatory Visit: Payer: Self-pay | Admitting: Oncology

## 2015-08-20 ENCOUNTER — Inpatient Hospital Stay: Payer: Medicare Other | Admitting: Oncology

## 2015-08-20 ENCOUNTER — Ambulatory Visit: Payer: Medicare HMO

## 2015-08-20 ENCOUNTER — Ambulatory Visit: Admission: RE | Admit: 2015-08-20 | Payer: Medicare HMO | Source: Ambulatory Visit | Admitting: Radiation Oncology

## 2015-08-20 ENCOUNTER — Encounter (HOSPITAL_COMMUNITY): Payer: Self-pay | Admitting: Emergency Medicine

## 2015-08-20 DIAGNOSIS — I10 Essential (primary) hypertension: Secondary | ICD-10-CM | POA: Diagnosis not present

## 2015-08-20 DIAGNOSIS — Y9289 Other specified places as the place of occurrence of the external cause: Secondary | ICD-10-CM | POA: Insufficient documentation

## 2015-08-20 DIAGNOSIS — Y9389 Activity, other specified: Secondary | ICD-10-CM | POA: Insufficient documentation

## 2015-08-20 DIAGNOSIS — K219 Gastro-esophageal reflux disease without esophagitis: Secondary | ICD-10-CM | POA: Diagnosis not present

## 2015-08-20 DIAGNOSIS — W182XXA Fall in (into) shower or empty bathtub, initial encounter: Secondary | ICD-10-CM | POA: Insufficient documentation

## 2015-08-20 DIAGNOSIS — I2510 Atherosclerotic heart disease of native coronary artery without angina pectoris: Secondary | ICD-10-CM | POA: Diagnosis not present

## 2015-08-20 DIAGNOSIS — F329 Major depressive disorder, single episode, unspecified: Secondary | ICD-10-CM | POA: Insufficient documentation

## 2015-08-20 DIAGNOSIS — F419 Anxiety disorder, unspecified: Secondary | ICD-10-CM | POA: Diagnosis not present

## 2015-08-20 DIAGNOSIS — Z9981 Dependence on supplemental oxygen: Secondary | ICD-10-CM | POA: Insufficient documentation

## 2015-08-20 DIAGNOSIS — F909 Attention-deficit hyperactivity disorder, unspecified type: Secondary | ICD-10-CM | POA: Diagnosis not present

## 2015-08-20 DIAGNOSIS — Z8701 Personal history of pneumonia (recurrent): Secondary | ICD-10-CM | POA: Diagnosis not present

## 2015-08-20 DIAGNOSIS — G4730 Sleep apnea, unspecified: Secondary | ICD-10-CM | POA: Insufficient documentation

## 2015-08-20 DIAGNOSIS — Z9861 Coronary angioplasty status: Secondary | ICD-10-CM | POA: Diagnosis not present

## 2015-08-20 DIAGNOSIS — Y998 Other external cause status: Secondary | ICD-10-CM | POA: Insufficient documentation

## 2015-08-20 DIAGNOSIS — I252 Old myocardial infarction: Secondary | ICD-10-CM | POA: Insufficient documentation

## 2015-08-20 DIAGNOSIS — E785 Hyperlipidemia, unspecified: Secondary | ICD-10-CM | POA: Insufficient documentation

## 2015-08-20 DIAGNOSIS — W19XXXA Unspecified fall, initial encounter: Secondary | ICD-10-CM

## 2015-08-20 DIAGNOSIS — S0990XA Unspecified injury of head, initial encounter: Secondary | ICD-10-CM | POA: Insufficient documentation

## 2015-08-20 DIAGNOSIS — E119 Type 2 diabetes mellitus without complications: Secondary | ICD-10-CM | POA: Insufficient documentation

## 2015-08-20 DIAGNOSIS — Z9889 Other specified postprocedural states: Secondary | ICD-10-CM | POA: Insufficient documentation

## 2015-08-20 DIAGNOSIS — I509 Heart failure, unspecified: Secondary | ICD-10-CM | POA: Insufficient documentation

## 2015-08-20 DIAGNOSIS — Z7984 Long term (current) use of oral hypoglycemic drugs: Secondary | ICD-10-CM | POA: Diagnosis not present

## 2015-08-20 DIAGNOSIS — Z85828 Personal history of other malignant neoplasm of skin: Secondary | ICD-10-CM | POA: Diagnosis not present

## 2015-08-20 DIAGNOSIS — S3992XA Unspecified injury of lower back, initial encounter: Secondary | ICD-10-CM | POA: Diagnosis present

## 2015-08-20 DIAGNOSIS — S300XXA Contusion of lower back and pelvis, initial encounter: Secondary | ICD-10-CM | POA: Diagnosis not present

## 2015-08-20 MED ORDER — OXYCODONE-ACETAMINOPHEN 5-325 MG PO TABS
2.0000 | ORAL_TABLET | Freq: Once | ORAL | Status: AC
  Administered 2015-08-20: 2 via ORAL
  Filled 2015-08-20: qty 2

## 2015-08-20 NOTE — ED Notes (Signed)
From home via [facility], fell today (X2 in last 4 days), lung CA with mets, missed chemo yesterday, no trauma, no LOC, VSS, CBG 159, chronic back pain, c/o weakness  20g Left hand

## 2015-08-20 NOTE — ED Notes (Signed)
Iv 20 g Lt hand removed prior to DC that had been started by EMS

## 2015-08-20 NOTE — Telephone Encounter (Signed)
s.w. pt wife and advised on April appt

## 2015-08-20 NOTE — ED Notes (Signed)
Bed: UG89 Expected date:  Expected time:  Means of arrival:  Comments: 41M/fall/ca pt

## 2015-08-20 NOTE — ED Provider Notes (Signed)
CSN: [id number]     Arrival date & time 08/20/15  0801 History   First MD Initiated Contact with Patient 08/20/15 [phone]     Chief Complaint  Patient presents with  . Fall     (Consider location/radiation/quality/duration/timing/severity/associated sxs/prior Treatment) HPI Comments: Pt comes in with cc of fall. Hx of lung CA. Pt reports that he was in the shower, lost balance and fell. No LOC. + mild headache in the back. No nausea, vomiting, visual complains, seizures, altered mental status, loss of consciousness, new weakness, or numbness, no gait instability. Pt is not on blood thinners. He has no neck pain. Pt is c/o lower back pain - which is where the impact was.     Patient is a 50 y.o. male presenting with fall. The history is provided by the patient.  Fall Associated symptoms include headaches.    Past Medical History  Diagnosis Date  . Hyperlipidemia     takes Lipitor nightly  . 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 Stented coronary artery 2000, 2010  . Heart attack ([place]) 2000  . Shortness of breath     with exertion  . Peripheral edema     takes Furosemide daily  . Pneumonia     hx of >86yr ago  . Headache(784.0)   . Arthritis   . Joint pain   . Joint swelling   . Bruises easily     takes Effient daily  . GERD (gastroesophageal reflux disease)     takes Prilosec prn  . Urinary urgency   . Nocturia   . Diabetes mellitus     takes Januvia,Metformin,and Glipizide daily  . ADD (attention deficit disorder with hyperactivity)   . Anxiety     takes Xanax bid  . Depression     takes Celexa daily  . ADD (attention deficit disorder with hyperactivity)     takes Adderall daily  . Sleep apnea     uses CPAP nightly  . Hypertension   . Coronary artery disease     last cath 1/14  . CHF (congestive heart failure) ([facility]   . Asthma   . Squamous cell carcinoma ([facility]    Past Surgical History  Procedure Laterality Date  . Knee surgery  24  yrs. ago    right  . Application of wound vac  11/20/2010    and suture of bleeding post-op wound right buttock  . Rectal surgery  11/20/2010    exc. of infection perianal area right buttock (I & D)  . Ankle arthroscopy  05/13/2011    Procedure: ANKLE ARTHROSCOPY;  Surgeon: DNinetta Lights MD;  [place] [facility]  Service: Orthopedics;  Laterality: Right;  right ankle arthroscopy with extensive debridement synovectomy and chondroplasty  . Colonoscopy    . Total knee arthroplasty  10/13/2011    Procedure: TOTAL KNEE ARTHROPLASTY;  Surgeon: DNinetta Lights MD;  [place] [facility]  Service: Orthopedics;  Laterality: Right;  DR MURPHY WANTS 90 MINUTES FOR THIS CASE  . Cardiac catheterization  12/05/2008    and in 2000  . Coronary angioplasty      2 stents  . Cardiac catheterization  1/14    no stent put in  . Hydradenitis excision Right 10/30/2012    Procedure: wide EXCISION HYDRADENITIS right buttock ;  Surgeon: DHarl Bowie MD;  [place] [facility]  Service: General;  Laterality: Right;  . Cholecystectomy N/A 10/11/2013  Procedure: LAPAROSCOPIC CHOLECYSTECTOMY ;  Surgeon: Harl Bowie, MD;  Location: [place];  Service: General;  Laterality: N/A;  . Left heart catheterization with coronary angiogram N/A 06/15/2012    Procedure: LEFT HEART CATHETERIZATION WITH CORONARY ANGIOGRAM;  Surgeon: Clent Demark, MD;  Location: [place] [facility];  Service: Cardiovascular;  Laterality: N/A;  . Video bronchoscopy with endobronchial ultrasound Left 08/05/2015    Procedure: VIDEO BRONCHOSCOPY WITH ENDOBRONCHIAL ULTRASOUND with Biopsy;  Surgeon: Collene Gobble, MD;  Location: [facility];  Service: Thoracic;  Laterality: Left;   Family History  Problem Relation Age of Onset  . Anesthesia problems Neg Hx   . Hypotension Neg Hx   . Malignant hyperthermia Neg Hx   . Pseudochol deficiency Neg Hx   . Other Mother     car accident; brain injury   Social History  Substance Use  Topics  . Smoking status: Current Every Day Smoker -- 1.00 packs/day for 25 years    Types: Cigarettes  . Smokeless tobacco: Never Used  . Alcohol Use: No    Review of Systems  Constitutional: Negative for activity change.  Respiratory: Negative for chest tightness.   Skin: Negative for wound.  Neurological: Positive for headaches. Negative for dizziness.  Hematological: Does not bruise/bleed easily.  Psychiatric/Behavioral: Negative for confusion.      Allergies  Review of patient's allergies indicates no known allergies.  Home Medications   Prior to Admission medications   Medication Sig Start Date End Date Taking? Authorizing Provider  albuterol (PROVENTIL HFA;VENTOLIN HFA) 108 (90 BASE) MCG/ACT inhaler Inhale 2 puffs into the lungs every 6 (six) hours as needed for wheezing.   Yes Historical Provider, MD  ALPRAZolam Duanne Moron) 1 MG tablet Take 0.5-1 mg by mouth 2 (two) times daily as needed for anxiety.    Yes Historical Provider, MD  aspirin 81 MG chewable tablet Chew 81 mg by mouth 2 (two) times daily.    Yes Historical Provider, MD  atorvastatin (LIPITOR) 40 MG tablet Take 40 mg by mouth every evening.    Yes Historical Provider, MD  calcium carbonate (OS-CAL - DOSED IN MG OF ELEMENTAL CALCIUM) 1250 MG tablet Take 1 tablet by mouth at bedtime.    Yes Historical Provider, MD  carvedilol (COREG) 3.125 MG tablet Take 1 tablet (3.125 mg total) by mouth 2 (two) times daily with a meal. 12/08/12  Yes Charolette Forward, MD  citalopram (CELEXA) 40 MG tablet Take 40 mg by mouth every evening. PM   Yes Historical Provider, MD  dicyclomine (BENTYL) 20 MG tablet Take 20 mg by mouth 2 (two) times daily as needed for spasms.    Yes Historical Provider, MD  furosemide (LASIX) 40 MG tablet Take 80 mg by mouth daily. 06/11/15  Yes Historical Provider, MD  glipiZIDE (GLUCOTROL) 10 MG tablet Take 10 mg by mouth 2 (two) times daily before a meal.    Yes Historical Provider, MD  ibuprofen (ADVIL,MOTRIN)  200 MG tablet Take 800 mg by mouth every 6 (six) hours as needed for moderate pain.    Yes Historical Provider, MD  metFORMIN (GLUCOPHAGE) 1000 MG tablet Take 1,000 mg by mouth 2 (two) times daily.    Yes Historical Provider, MD  Multiple Vitamin (MULTIVITAMIN WITH MINERALS) TABS Take 1 tablet by mouth daily.   Yes Historical Provider, MD  nitroGLYCERIN (NITROSTAT) 0.4 MG SL tablet Place 1 tablet (0.4 mg total) under the tongue every 5 (five) minutes x 3 doses as needed for chest pain. 12/08/12  Yes Charolette Forward, MD  omeprazole (PRILOSEC) 40 MG capsule Take 80 mg by mouth at bedtime. Reported on 08/18/2015   Yes Historical Provider, MD  oxyCODONE-acetaminophen (PERCOCET) 10-325 MG tablet Take 1 tablet by mouth every 4 (four) hours as needed for pain. 08/13/15  Yes Wyatt Portela, MD  pantoprazole (PROTONIX) 40 MG tablet Take 40 mg by mouth daily. 07/07/15  Yes Historical Provider, MD  ramipril (ALTACE) 5 MG capsule Take 5 mg by mouth daily.  06/11/15  Yes Historical Provider, MD  sitaGLIPtin (JANUVIA) 100 MG tablet Take 100 mg by mouth every morning.    Yes Historical Provider, MD  morphine (MS CONTIN) 15 MG 12 hr tablet Take 1 tablet (15 mg total) by mouth every 12 (twelve) hours. 08/19/15   Hayden Pedro, PA-C   BP 126/95 mmHg  Pulse 107  Temp(Src) 97.6 F (36.4 C) (Oral)  Resp 18  SpO2 91% Physical Exam  Constitutional: He is oriented to person, place, and time. He appears well-developed.  HENT:  Head: Atraumatic.  Neck: Neck supple.  Cardiovascular: Normal rate.   Pulmonary/Chest: Effort normal.  Musculoskeletal:  Head to toe evaluation shows no hematoma, bleeding of the scalp, no facial abrasions, step offs, crepitus, no tenderness to palpation of the bilateral upper and lower extremities, no gross deformities, no chest tenderness, no pelvic pain.  Pt has tenderness over the lumbar region No step offs, no erythema. Pt has 2+ patellar reflex bilaterally. Able to discriminate  between sharp and dull. Able to ambulate   Neurological: He is alert and oriented to person, place, and time. No cranial nerve deficit. Coordination normal.  Skin: Skin is warm. No rash noted.  Nursing note and vitals reviewed.   ED Course  Procedures (including critical care time)  '@11'$ :40: Pt was reassessed. Family in the room. Discussed the findings of the results. Advised chemo treatment f.u as planned. Advised close monitoring of the patient and to bring him back to the ER if there is syncope.   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 Osseous demineralization. Facet degenerative changes lower lumbar spine. Disc space narrowing L4-L5, L1-L2, at lower thoracic spine. Mild chronic anterior height loss at T12 appears grossly stable. No definite acute fracture, subluxation or bone destruction. No spondylolysis. Tiny nonobstructing RIGHT renal calculus. Post cholecystectomy. SI joints symmetric. Scattered atherosclerotic calcifications. IMPRESSION: Mild degenerative changes of the lumbar spine as above. Anterior height loss T12, appears old. No acute abnormalities. Tiny nonobstructing RIGHT renal calculus. Electronically Signed   By: Lavonia Dana M.D.   On: 08/20/2015 09:31   I have personally reviewed and evaluated these images and lab results as part of my medical decision-making.   EKG Interpretation None      MDM   Final diagnoses:  Fall, initial encounter  Lumbar contusion, initial encounter    Pt comes in with cc of fall.  Pt had a mechanical fall at home. He  has no signs of severe trauma.  Had a mild headache, but no other red flags for ICH. Pt's cspine was cleared clinically. He has L spine tenderness Xrays  ordered. Pt ambulated with me, no assistance needed, he was steady. Will observe in the ER for a bit longer and xrays of the spine. If normal - will d.c.  Pt has hx of cancer. Specifically we asked him if he had dizziness, near syncope - and he denies. He also denied increasing dyspnea or chest pain.    Varney Biles, MD 08/20/15 1147

## 2015-08-20 NOTE — ED Notes (Signed)
Pt ambulated in hallway with myself and the MD without complication. One instance of rt "hip giving out" which patient states has been normal for him "for a little while." Pt was able to maintain balance and stature with minimal assistance.

## 2015-08-20 NOTE — Discharge Instructions (Signed)
We saw you in the ER after you had a fall. All the imaging results are normal, no fractures seen. No evidence of brain bleed based on the exam. Please be very careful with walking, and do everything possible to prevent falls.  Please return to the ER if the headache gets severe and in not improving, you have associated new one sided numbness, tingling, weakness or confusion, seizures, poor balance or poor vision.   Contusion A contusion is a deep bruise. Contusions are the result of a blunt injury to tissues and muscle fibers under the skin. The injury causes bleeding under the skin. The skin overlying the contusion may turn blue, purple, or yellow. Minor injuries will give you a painless contusion, but more severe contusions may stay painful and swollen for a few weeks.  CAUSES  This condition is usually caused by a blow, trauma, or direct force to an area of the body. SYMPTOMS  Symptoms of this condition include:  Swelling of the injured area.  Pain and tenderness in the injured area.  Discoloration. The area may have redness and then turn blue, purple, or yellow. DIAGNOSIS  This condition is diagnosed based on a physical exam and medical history. An X-ray, CT scan, or MRI may be needed to determine if there are any associated injuries, such as broken bones (fractures). TREATMENT  Specific treatment for this condition depends on what area of the body was injured. In general, the best treatment for a contusion is resting, icing, applying pressure to (compression), and elevating the injured area. This is often called the RICE strategy. Over-the-counter anti-inflammatory medicines may also be recommended for pain control.  HOME CARE INSTRUCTIONS   Rest the injured area.  If directed, apply ice to the injured area:  Put ice in a plastic bag.  Place a towel between your skin and the bag.  Leave the ice on for 20 minutes, 2-3 times per day.  If directed, apply light compression to the  injured area using an elastic bandage. Make sure the bandage is not wrapped too tightly. Remove and reapply the bandage as directed by your health care provider.  If possible, raise (elevate) the injured area above the level of your heart while you are sitting or lying down.  Take over-the-counter and prescription medicines only as told by your health care provider. SEEK MEDICAL CARE IF:  Your symptoms do not improve after several days of treatment.  Your symptoms get worse.  You have difficulty moving the injured area. SEEK IMMEDIATE MEDICAL CARE IF:   You have severe pain.  You have numbness in a hand or foot.  Your hand or foot turns pale or cold.   This information is not intended to replace advice given to you by your health care provider. Make sure you discuss any questions you have with your health care provider.   Document Released: 03/10/2005 Document Revised: 02/19/2015 Document Reviewed: 10/16/2014 Elsevier Interactive Patient Education [employer].

## 2015-08-20 NOTE — ED Notes (Signed)
MD at bedside. 

## 2015-08-21 ENCOUNTER — Telehealth: Payer: Self-pay | Admitting: *Deleted

## 2015-08-21 ENCOUNTER — Other Ambulatory Visit: Payer: Self-pay | Admitting: *Deleted

## 2015-08-21 ENCOUNTER — Ambulatory Visit: Payer: Medicare HMO | Admitting: Radiation Oncology

## 2015-08-21 ENCOUNTER — Ambulatory Visit: Payer: Medicare HMO

## 2015-08-21 DIAGNOSIS — R0609 Other forms of dyspnea: Secondary | ICD-10-CM

## 2015-08-21 DIAGNOSIS — C3432 Malignant neoplasm of lower lobe, left bronchus or lung: Secondary | ICD-10-CM

## 2015-08-21 DIAGNOSIS — R918 Other nonspecific abnormal finding of lung field: Secondary | ICD-10-CM

## 2015-08-21 DIAGNOSIS — F419 Anxiety disorder, unspecified: Secondary | ICD-10-CM

## 2015-08-21 DIAGNOSIS — J9601 Acute respiratory failure with hypoxia: Secondary | ICD-10-CM

## 2015-08-21 MED ORDER — MORPHINE SULFATE (CONCENTRATE) 20 MG/ML PO SOLN: ORAL | Status: DC

## 2015-08-21 MED ORDER — LORAZEPAM 1 MG PO TABS: 1.0000 mg | ORAL_TABLET | ORAL | Status: DC | PRN

## 2015-08-21 NOTE — Telephone Encounter (Signed)
Returned voice message this am from wife Butch Penny, patient fell again and after speaking with the [place] management , Wife and spouse , they have decided hospice is what they want, comfort only, and pain management; and to cancel any further radiation or chemotherapy, I will forward this tyo Dr. Lisbeth Renshaw and Dr. Alen Blew, transferred call from wife Butch Penny to Dr. Hazeline Junker nurse phone, wife thanked this RN for returning call so soon  8:39 AM

## 2015-08-21 NOTE — Telephone Encounter (Signed)
Per wife's voice message, patient is declining rapidly and needs Hospice. She is unable to get him outside, down the stairs and to the car for his appointments. My return number is [phone]. Per my conversation with Dr. Alen Blew, it's OK to make a referral to Hospice. Hospice referral made. Also, [facility] did deliver his bed. Wife notified of hospice referral.

## 2015-08-22 ENCOUNTER — Ambulatory Visit: Payer: Medicare HMO

## 2015-08-24 ENCOUNTER — Inpatient Hospital Stay (HOSPITAL_COMMUNITY)
Admission: EM | Admit: 2015-08-24 | Discharge: 2015-08-25 | DRG: 181 | Disposition: A | Discharge status: Hospice | Attending: Internal Medicine | Admitting: Internal Medicine

## 2015-08-24 ENCOUNTER — Emergency Department (HOSPITAL_COMMUNITY)

## 2015-08-24 ENCOUNTER — Encounter (HOSPITAL_COMMUNITY): Payer: Self-pay | Admitting: Emergency Medicine

## 2015-08-24 DIAGNOSIS — G4733 Obstructive sleep apnea (adult) (pediatric): Secondary | ICD-10-CM | POA: Diagnosis present

## 2015-08-24 DIAGNOSIS — Z515 Encounter for palliative care: Secondary | ICD-10-CM | POA: Diagnosis not present

## 2015-08-24 DIAGNOSIS — E119 Type 2 diabetes mellitus without complications: Secondary | ICD-10-CM | POA: Diagnosis present

## 2015-08-24 DIAGNOSIS — Z87820 Personal history of traumatic brain injury: Secondary | ICD-10-CM | POA: Diagnosis not present

## 2015-08-24 DIAGNOSIS — Z955 Presence of coronary angioplasty implant and graft: Secondary | ICD-10-CM | POA: Diagnosis not present

## 2015-08-24 DIAGNOSIS — C7951 Secondary malignant neoplasm of bone: Secondary | ICD-10-CM | POA: Diagnosis present

## 2015-08-24 DIAGNOSIS — F909 Attention-deficit hyperactivity disorder, unspecified type: Secondary | ICD-10-CM | POA: Diagnosis present

## 2015-08-24 DIAGNOSIS — C779 Secondary and unspecified malignant neoplasm of lymph node, unspecified: Secondary | ICD-10-CM | POA: Diagnosis present

## 2015-08-24 DIAGNOSIS — C7970 Secondary malignant neoplasm of unspecified adrenal gland: Secondary | ICD-10-CM | POA: Diagnosis present

## 2015-08-24 DIAGNOSIS — G893 Neoplasm related pain (acute) (chronic): Secondary | ICD-10-CM | POA: Diagnosis present

## 2015-08-24 DIAGNOSIS — Z96651 Presence of right artificial knee joint: Secondary | ICD-10-CM | POA: Diagnosis present

## 2015-08-24 DIAGNOSIS — Z7984 Long term (current) use of oral hypoglycemic drugs: Secondary | ICD-10-CM

## 2015-08-24 DIAGNOSIS — F418 Other specified anxiety disorders: Secondary | ICD-10-CM | POA: Diagnosis present

## 2015-08-24 DIAGNOSIS — Z79899 Other long term (current) drug therapy: Secondary | ICD-10-CM

## 2015-08-24 DIAGNOSIS — R451 Restlessness and agitation: Secondary | ICD-10-CM | POA: Insufficient documentation

## 2015-08-24 DIAGNOSIS — F17210 Nicotine dependence, cigarettes, uncomplicated: Secondary | ICD-10-CM | POA: Diagnosis present

## 2015-08-24 DIAGNOSIS — C3490 Malignant neoplasm of unspecified part of unspecified bronchus or lung: Secondary | ICD-10-CM | POA: Diagnosis present

## 2015-08-24 DIAGNOSIS — I5032 Chronic diastolic (congestive) heart failure: Secondary | ICD-10-CM | POA: Diagnosis present

## 2015-08-24 DIAGNOSIS — J9611 Chronic respiratory failure with hypoxia: Secondary | ICD-10-CM | POA: Diagnosis present

## 2015-08-24 DIAGNOSIS — I110 Hypertensive heart disease with heart failure: Secondary | ICD-10-CM | POA: Diagnosis present

## 2015-08-24 DIAGNOSIS — R0600 Dyspnea, unspecified: Secondary | ICD-10-CM | POA: Insufficient documentation

## 2015-08-24 DIAGNOSIS — I2510 Atherosclerotic heart disease of native coronary artery without angina pectoris: Secondary | ICD-10-CM | POA: Diagnosis present

## 2015-08-24 DIAGNOSIS — I252 Old myocardial infarction: Secondary | ICD-10-CM

## 2015-08-24 DIAGNOSIS — J45909 Unspecified asthma, uncomplicated: Secondary | ICD-10-CM | POA: Diagnosis present

## 2015-08-24 DIAGNOSIS — C3492 Malignant neoplasm of unspecified part of left bronchus or lung: Secondary | ICD-10-CM | POA: Diagnosis present

## 2015-08-24 DIAGNOSIS — F419 Anxiety disorder, unspecified: Secondary | ICD-10-CM | POA: Diagnosis present

## 2015-08-24 DIAGNOSIS — C787 Secondary malignant neoplasm of liver and intrahepatic bile duct: Secondary | ICD-10-CM | POA: Diagnosis present

## 2015-08-24 DIAGNOSIS — Z7189 Other specified counseling: Secondary | ICD-10-CM | POA: Insufficient documentation

## 2015-08-24 DIAGNOSIS — K219 Gastro-esophageal reflux disease without esophagitis: Secondary | ICD-10-CM | POA: Diagnosis present

## 2015-08-24 DIAGNOSIS — J910 Malignant pleural effusion: Secondary | ICD-10-CM | POA: Diagnosis present

## 2015-08-24 DIAGNOSIS — R0902 Hypoxemia: Secondary | ICD-10-CM | POA: Diagnosis present

## 2015-08-24 DIAGNOSIS — E785 Hyperlipidemia, unspecified: Secondary | ICD-10-CM | POA: Diagnosis present

## 2015-08-24 DIAGNOSIS — Z66 Do not resuscitate: Secondary | ICD-10-CM | POA: Diagnosis present

## 2015-08-24 LAB — BASIC METABOLIC PANEL
ANION GAP: 20 — AB (ref 5–15)
BUN: 20 mg/dL (ref 6–20)
CALCIUM: 11 mg/dL — AB (ref 8.9–10.3)
CO2: 28 mmol/L (ref 22–32)
Chloride: 89 mmol/L — ABNORMAL LOW (ref 101–111)
Creatinine, Ser: 0.71 mg/dL (ref 0.61–1.24)
GFR calc non Af Amer: >60 mL/min (ref >60)
Glucose, Bld: 131 mg/dL — ABNORMAL HIGH (ref 65–99)
Potassium: 4.1 mmol/L (ref 3.5–5.1)
SODIUM: 137 mmol/L (ref 135–145)

## 2015-08-24 LAB — BRAIN NATRIURETIC PEPTIDE: B NATRIURETIC PEPTIDE 5: 81.9 pg/mL (ref 0.0–100.0)

## 2015-08-24 LAB — CBG MONITORING, ED: Glucose-Capillary: 138 mg/dL — ABNORMAL HIGH (ref 65–99)

## 2015-08-24 LAB — I-STAT TROPONIN, ED: TROPONIN I, POC: 0.02 ng/mL (ref 0.00–0.08)

## 2015-08-24 MED ORDER — ONDANSETRON HCL 4 MG PO TABS: 4.0000 mg | ORAL_TABLET | Freq: Four times a day (QID) | ORAL | Status: DC | PRN

## 2015-08-24 MED ORDER — HYDROMORPHONE BOLUS VIA INFUSION
0.5000 mg | INTRAVENOUS | Status: DC | PRN
  Administered 2015-08-24 – 2015-08-25 (×6): 0.5 mg via INTRAVENOUS
  Filled 2015-08-24 (×7): qty 1

## 2015-08-24 MED ORDER — HYDROMORPHONE HCL 1 MG/ML IJ SOLN
1.0000 mg | Freq: Once | INTRAMUSCULAR | Status: AC
  Administered 2015-08-24: 1 mg via INTRAVENOUS
  Filled 2015-08-24: qty 1

## 2015-08-24 MED ORDER — SODIUM CHLORIDE 0.9 % IV SOLN
1.0000 mg/h | INTRAVENOUS | Status: DC
  Administered 2015-08-24 – 2015-08-25 (×2): 1 mg/h via INTRAVENOUS
  Filled 2015-08-24 (×2): qty 2.5

## 2015-08-24 MED ORDER — ONDANSETRON HCL 4 MG/2ML IJ SOLN: 4.0000 mg | Freq: Four times a day (QID) | INTRAMUSCULAR | Status: DC | PRN

## 2015-08-24 MED ORDER — LORAZEPAM 2 MG/ML IJ SOLN
INTRAMUSCULAR | Status: AC
  Administered 2015-08-24: 2 mg via INTRAVENOUS
  Filled 2015-08-24: qty 1

## 2015-08-24 MED ORDER — SODIUM CHLORIDE 0.9 % IV SOLN
INTRAVENOUS | Status: DC
  Administered 2015-08-24: 10:00:00 via INTRAVENOUS

## 2015-08-24 MED ORDER — POLYETHYLENE GLYCOL 3350 17 G PO PACK: 17.0000 g | PACK | Freq: Every day | ORAL | Status: DC | PRN

## 2015-08-24 MED ORDER — CARVEDILOL 3.125 MG PO TABS: 3.1250 mg | ORAL_TABLET | Freq: Two times a day (BID) | ORAL | Status: DC

## 2015-08-24 MED ORDER — LORAZEPAM 2 MG/ML IJ SOLN
1.0000 mg | INTRAMUSCULAR | Status: DC | PRN
  Administered 2015-08-24 – 2015-08-25 (×4): 1 mg via INTRAVENOUS
  Filled 2015-08-24 (×4): qty 1

## 2015-08-24 MED ORDER — LORAZEPAM 2 MG/ML IJ SOLN
2.0000 mg | Freq: Once | INTRAMUSCULAR | Status: AC
Start: 2015-08-24 — End: 2015-08-24
  Administered 2015-08-24: 2 mg via INTRAVENOUS

## 2015-08-24 MED ORDER — CITALOPRAM HYDROBROMIDE 20 MG PO TABS: 40.0000 mg | ORAL_TABLET | Freq: Every evening | ORAL | Status: DC

## 2015-08-24 NOTE — ED Notes (Signed)
Dr. Rowe Pavy ([place] ) Contact info [phone]

## 2015-08-24 NOTE — ED Notes (Signed)
Pt agitated, refusing to keep oxygen and cardiac monitor on. Demanding to go home.  Verbal order from [place] PA to dc cardiac monitoring. No new orders for medications.   Palliative care MD at bedside. Verbal orders received.

## 2015-08-24 NOTE — Consult Note (Signed)
Consultation Note Date: 08/24/2015   Patient Name: Cody [name]  DOB: 02-22-1966  MRN: [id number]  Age / Sex: 50 y.o., male  PCP: Chesley Noon, MD Referring Physician: Velna Hatchet, MD  Reason for Consultation: Establishing goals of care and Terminal Care  Life limiting illness: lung cancer.   Clinical Assessment/Narrative:  Patient is a 50 yo gentleman from [place], [place], he lives at home with his wife and 26 yo daughter. Patient has a PMH significant for 30 pack year smoking history, CAD s/p PCI in 2000 and in 2010, CHF EF 41-93%, diastolic dysfunction, GERD, DM, HTN, Asthma and OSA.   Patient was recently hospitalized and was diagnosed with NSCLC Sq cell ca L lung. He was connected with Dr Alen Blew, he was connected with Rad Onc, received one dose radiation treatment and then decided not to pursue any further treatments.   Patient has had extensive symptom burden, uncontrolled pain, ongoing weakness, falls, agitation, anxiety and dyspnea at home. He was recently connected with [facility] services on 08-21-15. His wife Cody [name] fell down at home while trying to take care of him and had to be evaluated in the ED herself earlier this am. She has significant care giver burn out and distress.   Introduced scope of palliative services, outlined goals of care with the patient's wife in the ED by the patient's bedside this am. Patient is acutely agitated, pulling out his gowns, his telemetry, his O2. He is confused and appears to be using accessory muscles of respiration.   Wife is tearful, she states that she has been pondering about whether or not to send him to hospice as she had wished to honor the patient's wishes to die at home. She states she has not eaten or slept in 5 days. She states she cannot do this anymore. Discussed with patient and his wife about comfort measures, admit to hospital.     Contacts/Participants in  Discussion: Primary Decision Maker:      HCPOA: yes  Wife Cody [name]   SUMMARY OF RECOMMENDATIONS: DNR DNI Comfort measures only Admit to hospital for intractable symptoms, possibly terminal delirium, acute on chronic hypoxic resp failure Supplemental O2 for comfort Dialudid bolus and continuous IV infusion for pain and or dyspnea.  Ativan PRN for agitation and anxiety.  Prognosis hours to days discussed with patient's wife present at the bedside. Chaplain consult for end of life care.   Code Status/Advance Care Planning: DNR    Code Status Orders        Start     Ordered   08/24/15 7902  Do not attempt resuscitation (DNR)   Continuous    Question Answer Comment  In the event of cardiac or respiratory ARREST Do not call a "code blue"   In the event of cardiac or respiratory ARREST Do not perform Intubation, CPR, defibrillation or ACLS   In the event of cardiac or respiratory ARREST Use medication by any route, position, wound care, and other measures to relive pain and suffering. May use oxygen, suction and manual treatment of airway obstruction as needed for comfort.      08/24/15 0921    Code Status History    Date Active Date Inactive Code Status Order ID Comments User Context   08/13/2015 10:14 PM 08/15/2015  2:47 PM Full Code [id number]  Merton Border, MD Inpatient   08/07/2015  2:45 PM 08/08/2015  7:18 PM DNR [id number]  Raylene Miyamoto, MD Inpatient   07/30/2015  1:17 PM 08/07/2015  2:45 PM Full Code [id number]  Rondel Jumbo, PA-C ED   10/11/2013 10:56 AM 10/11/2013  7:00 PM Full Code [id number]  Coralie Keens, MD Inpatient   09/18/2013 11:21 AM 09/19/2013  4:29 AM Full Code [id number]  Dereck Ligas, MD HOV      Other Directives:Other  Symptom Management:      Palliative Prophylaxis:   Delirium Protocol  Additional Recommendations (Limitations, Scope, Preferences): Full scope of comfort measures only   Psycho-social/Spiritual:  Support System: [facility] Desire for  further Chaplaincy support:yes Additional Recommendations: Caregiving  Support/Resources  Prognosis: Hours - Days  Discharge Planning: Anticipated Hospital Death   Chief Complaint/ Primary Diagnoses: Present on Admission:  . Hypoxemia Lung cancer  I have reviewed the medical record, interviewed the patient and family, and examined the patient. The following aspects are pertinent.  Past Medical History  Diagnosis Date  . Hyperlipidemia     takes Lipitor nightly  . 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 Stented coronary artery 2000, 2010  . Heart attack ([place]) 2000  . Shortness of breath     with exertion  . Peripheral edema     takes Furosemide daily  . Pneumonia     hx of >29yr ago  . Headache(784.0)   . Arthritis   . Joint pain   . Joint swelling   . Bruises easily     takes Effient daily  . GERD (gastroesophageal reflux disease)     takes Prilosec prn  . Urinary urgency   . Nocturia   . Diabetes mellitus     takes Januvia,Metformin,and Glipizide daily  . ADD (attention deficit disorder with hyperactivity)   . Anxiety     takes Xanax bid  . Depression     takes Celexa daily  . ADD (attention deficit disorder with hyperactivity)     takes Adderall daily  . Sleep apnea     uses CPAP nightly  . Hypertension   . Coronary artery disease     last cath 1/14  . [place]estive heart failure) [place]   . Asthma   . Squamous cell carcinoma ([facility]    Social History   Social History  . Marital Status: Married    Spouse Name: DButch [name] . Number of Children: 1  . Years of Education: 12th   Occupational History  . n/a    Social History Main Topics  . Smoking status: Current Every Day Smoker -- 1.00 packs/day for 25 years    Types: Cigarettes  . Smokeless tobacco: Never Used  . Alcohol Use: No  . Drug Use: No  . Sexual Activity: Yes   Other Topics Concern  . None   Social History Narrative   Patient lives at home with family.   Caffeine  Use: 8-9 cups of soda daily   Family History  Problem Relation Age of Onset  . Anesthesia problems Neg Hx   . Hypotension Neg Hx   . Malignant hyperthermia Neg Hx   . Pseudochol deficiency Neg Hx   . Other Mother     car accident; brain injury   Scheduled Meds:  Continuous Infusions: . HYDROmorphone     PRN Meds:.HYDROmorphone, LORazepam Medications Prior to Admission:  Prior to Admission medications   Medication Sig Start Date End Date Taking? Authorizing Provider  albuterol (PROVENTIL HFA;VENTOLIN HFA) 108 (90 BASE) MCG/ACT inhaler Inhale 2 puffs into the lungs every 6 (six) hours as needed for wheezing.  Yes Historical Provider, MD  ALPRAZolam Duanne Moron) 1 MG tablet Take 0.5-1 mg by mouth 2 (two) times daily as needed for anxiety.    Yes Historical Provider, MD  citalopram (CELEXA) 40 MG tablet Take 40 mg by mouth every evening. PM   Yes Historical Provider, MD  morphine (MS CONTIN) 15 MG 12 hr tablet Take 30 mg by mouth every 12 (twelve) hours.  08/20/15  Yes Historical Provider, MD  morphine (ROXANOL) 20 MG/ML concentrated solution TAKE 0.5 -1.0 ml every two hours as needed for pain. 08/21/15  Yes Wyatt Portela, MD  trolamine salicylate (ASPERCREME) 10 % cream Apply 1 application topically 2 (two) times daily as needed for muscle pain.   Yes Historical Provider, MD  carvedilol (COREG) 3.125 MG tablet Take 1 tablet (3.125 mg total) by mouth 2 (two) times daily with a meal. Patient not taking: Reported on 08/24/2015 12/08/12   Charolette Forward, MD  LORazepam (ATIVAN) 1 MG tablet Take 1 tablet (1 mg total) by mouth every 4 (four) hours as needed for anxiety. Patient not taking: Reported on 08/24/2015 08/21/15   Wyatt Portela, MD  metFORMIN (GLUCOPHAGE) 1000 MG tablet Take 1,000 mg by mouth 2 (two) times daily. Reported on 08/24/2015    Historical Provider, MD  nitroGLYCERIN (NITROSTAT) 0.4 MG SL tablet Place 1 tablet (0.4 mg total) under the tongue every 5 (five) minutes x 3 doses as needed for  chest pain. 12/08/12   Charolette Forward, MD  oxyCODONE-acetaminophen (PERCOCET) 10-325 MG tablet Take 1 tablet by mouth every 4 (four) hours as needed for pain. Patient not taking: Reported on 08/24/2015 08/13/15   Wyatt Portela, MD   No Known Allergies  Review of Systems Obtained through the patient's wife: + for minimal to nil PO intake for past 5-6 days, + for agitation, restlessness, dyspnea, anxiety, air hunger.   Physical Exam Confused agitated Diminished breath sounds S1 S2 Abdomen soft Scar on R knee No edema Extremities warm to touch, no coolness no mottling.   Vital Signs: BP 113/78 mmHg  Pulse 125  Temp(Src) 97.5 F (36.4 C) (Oral)  Resp 19  SpO2 96%  SpO2: SpO2: 96 % O2 Device:SpO2: 96 % O2 Flow Rate: .O2 Flow Rate (L/min): 4 L/min  IO: Intake/output summary: No intake or output data in the 24 hours ending 08/24/15 0923  LBM:   Baseline Weight:   Most recent weight:        Palliative Assessment/Data:  Flowsheet Rows        Most Recent Value   Intake Tab    Referral Department  Hospitalist   Unit at Time of Referral  ER   Palliative Care Primary Diagnosis  Cancer   Palliative Care Type  New Palliative care   Reason for referral  Pain, Non-pain Symptom, Clarify Goals of Care, End of Life Care Assistance   Date first seen by Palliative Care  08/24/15   Clinical Assessment    Palliative Performance Scale Score  20%   Pain Max last 24 hours  7   Pain Min Last 24 hours  6   Dyspnea Max Last 24 Hours  7   Dyspnea Min Last 24 hours  6   Psychosocial & Spiritual Assessment    Palliative Care Outcomes    Patient/Family meeting held?  Yes   Who was at the meeting?  patient, wife, friend.    Palliative Care follow-up planned  No      Additional Data Reviewed:  CBC:  Component Value Date/Time   WBC 14.8* 08/15/2015 0606   HGB 13.6 08/15/2015 0606   HCT 42.4 08/15/2015 0606   PLT 348 08/15/2015 0606   MCV 93.6 08/15/2015 0606   NEUTROABS 11.8*  08/13/2015 1812   LYMPHSABS 1.8 08/13/2015 1812   MONOABS 1.3* 08/13/2015 1812   EOSABS 0.2 08/13/2015 1812   BASOSABS 0.0 08/13/2015 1812   Comprehensive Metabolic Panel:    Component Value Date/Time   NA 137 08/24/2015 0641   K 4.1 08/24/2015 0641   CL 89* 08/24/2015 0641   CO2 28 08/24/2015 0641   BUN 20 08/24/2015 0641   CREATININE 0.71 08/24/2015 0641   GLUCOSE 131* 08/24/2015 0641   CALCIUM 11.0* 08/24/2015 0641   AST 56* 08/13/2015 1812   ALT 40 08/13/2015 1812   ALKPHOS 131* 08/13/2015 1812   BILITOT 1.0 08/13/2015 1812   PROT 6.6 08/13/2015 1812   ALBUMIN 2.7* 08/13/2015 1812     Time In 820 Time Out: 920 Time Total: 60  Greater than 50%  of this time was spent counseling and coordinating care related to the above assessment and plan.  Signed by: Loistine Chance, MD [place], MD  08/24/2015, 9:23 AM  Please contact Palliative Medicine Team phone at [phone] for questions and concerns.

## 2015-08-24 NOTE — ED Notes (Signed)
Bed: KG88 Expected date:  Expected time:  Means of arrival:  Comments: EMS 45M Lung CA

## 2015-08-24 NOTE — ED Provider Notes (Signed)
CSN: [id number]     Arrival date & time 08/24/15  0448 History   First MD Initiated Contact with Patient 08/24/15 [id number]     Chief Complaint  Patient presents with  . Shortness of Breath     (Consider location/radiation/quality/duration/timing/severity/associated sxs/prior Treatment) HPI Cody [name] is a 50 y.o. male with a history of left lower lobe malignancy, coronary artery disease, diabetes, comes in for evaluation of shortness of breath. Family at bedside reports patient is DO NOT RESUSCITATE, but reports he cannot get his pain under control at home with oral morphine. He was recently admitted and discharged last month with home oxygen, but he is not using this at home. Patient reports he does not want any other therapies in the emergency department, only pain control. Denies any other medical complaints.  Past Medical History  Diagnosis Date  . Hyperlipidemia     takes Lipitor nightly  . 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 Stented coronary artery 2000, 2010  . Heart attack ([place]) 2000  . Shortness of breath     with exertion  . Peripheral edema     takes Furosemide daily  . Pneumonia     hx of >39yr ago  . Headache(784.0)   . Arthritis   . Joint pain   . Joint swelling   . Bruises easily     takes Effient daily  . GERD (gastroesophageal reflux disease)     takes Prilosec prn  . Urinary urgency   . Nocturia   . Diabetes mellitus     takes Januvia,Metformin,and Glipizide daily  . ADD (attention deficit disorder with hyperactivity)   . Anxiety     takes Xanax bid  . Depression     takes Celexa daily  . ADD (attention deficit disorder with hyperactivity)     takes Adderall daily  . Sleep apnea     uses CPAP nightly  . Hypertension   . Coronary artery disease     last cath 1/14  . CHF (congestive heart failure) ([facility]   . Asthma   . Squamous cell carcinoma ([facility]    Past Surgical History  Procedure Laterality Date  . Knee surgery  24 yrs.  ago    right  . Application of wound vac  11/20/2010    and suture of bleeding post-op wound right buttock  . Rectal surgery  11/20/2010    exc. of infection perianal area right buttock (I & D)  . Ankle arthroscopy  05/13/2011    Procedure: ANKLE ARTHROSCOPY;  Surgeon: DNinetta Lights MD;  [facility]  Service: Orthopedics;  Laterality: Right;  right ankle arthroscopy with extensive debridement synovectomy and chondroplasty  . Colonoscopy    . Total knee arthroplasty  10/13/2011    Procedure: TOTAL KNEE ARTHROPLASTY;  Surgeon: DNinetta Lights MD;  [facility]  Service: Orthopedics;  Laterality: Right;  DR MURPHY WANTS 90 MINUTES FOR THIS CASE  . Cardiac catheterization  12/05/2008    and in 2000  . Coronary angioplasty      2 stents  . Cardiac catheterization  1/14    no stent put in  . Hydradenitis excision Right 10/30/2012    Procedure: wide EXCISION HYDRADENITIS right buttock ;  Surgeon: DHarl Bowie MD;  [facility]  Service: General;  Laterality: Right;  . Cholecystectomy N/A 10/11/2013    Procedure: LAPAROSCOPIC CHOLECYSTECTOMY ;  Surgeon: DMarco Collie  Ninfa Linden, MD;  Location: [facility];  Service: General;  Laterality: N/A;  . Left heart catheterization with coronary angiogram N/A 06/15/2012    Procedure: LEFT HEART CATHETERIZATION WITH CORONARY ANGIOGRAM;  Surgeon: Clent Demark, MD;  Location: [facility];  Service: Cardiovascular;  Laterality: N/A;  . Video bronchoscopy with endobronchial ultrasound Left 08/05/2015    Procedure: VIDEO BRONCHOSCOPY WITH ENDOBRONCHIAL ULTRASOUND with Biopsy;  Surgeon: Collene Gobble, MD;  [facility];  Service: Thoracic;  Laterality: Left;   Family History  Problem Relation Age of Onset  . Anesthesia problems Neg Hx   . Hypotension Neg Hx   . Malignant hyperthermia Neg Hx   . Pseudochol deficiency Neg Hx   . Other Mother     car accident; brain injury   Social History  Substance Use Topics   . Smoking status: Current Every Day Smoker -- 1.00 packs/day for 25 years    Types: Cigarettes  . Smokeless tobacco: Never Used  . Alcohol Use: No    Review of Systems A 10 point review of systems was completed and was negative except for pertinent positives and negatives as mentioned in the history of present illness     Allergies  Review of patient's allergies indicates no known allergies.  Home Medications   Prior to Admission medications   Medication Sig Start Date End Date Taking? Authorizing Provider  albuterol (PROVENTIL HFA;VENTOLIN HFA) 108 (90 BASE) MCG/ACT inhaler Inhale 2 puffs into the lungs every 6 (six) hours as needed for wheezing.   Yes Historical Provider, MD  ALPRAZolam Duanne Moron) 1 MG tablet Take 0.5-1 mg by mouth 2 (two) times daily as needed for anxiety.    Yes Historical Provider, MD  citalopram (CELEXA) 40 MG tablet Take 40 mg by mouth every evening. PM   Yes Historical Provider, MD  morphine (MS CONTIN) 15 MG 12 hr tablet Take 30 mg by mouth every 12 (twelve) hours.  08/20/15  Yes Historical Provider, MD  morphine (ROXANOL) 20 MG/ML concentrated solution TAKE 0.5 -1.0 ml every two hours as needed for pain. 08/21/15  Yes Wyatt Portela, MD  trolamine salicylate (ASPERCREME) 10 % cream Apply 1 application topically 2 (two) times daily as needed for muscle pain.   Yes Historical Provider, MD  carvedilol (COREG) 3.125 MG tablet Take 1 tablet (3.125 mg total) by mouth 2 (two) times daily with a meal. Patient not taking: Reported on 08/24/2015 12/08/12   Charolette Forward, MD  LORazepam (ATIVAN) 1 MG tablet Take 1 tablet (1 mg total) by mouth every 4 (four) hours as needed for anxiety. Patient not taking: Reported on 08/24/2015 08/21/15   Wyatt Portela, MD  metFORMIN (GLUCOPHAGE) 1000 MG tablet Take 1,000 mg by mouth 2 (two) times daily. Reported on 08/24/2015    Historical Provider, MD  nitroGLYCERIN (NITROSTAT) 0.4 MG SL tablet Place 1 tablet (0.4 mg total) under the tongue every  5 (five) minutes x 3 doses as needed for chest pain. 12/08/12   Charolette Forward, MD  oxyCODONE-acetaminophen (PERCOCET) 10-325 MG tablet Take 1 tablet by mouth every 4 (four) hours as needed for pain. Patient not taking: Reported on 08/24/2015 08/13/15   Wyatt Portela, MD   BP 113/78 mmHg  Pulse 125  Temp(Src) 97.5 F (36.4 C) (Oral)  Resp 19  SpO2 96% Physical Exam  Constitutional: He is oriented to person, place, and time. He appears well-developed and well-nourished.  Ill appearing white male. Labored breathing  HENT:  Head: Normocephalic and  atraumatic.  Mouth/Throat: Oropharynx is clear and moist.  Eyes: Conjunctivae are normal. Pupils are equal, round, and reactive to light. Right eye exhibits no discharge. Left eye exhibits no discharge. No scleral icterus.  Neck: Neck supple.  Cardiovascular: Regular rhythm and normal heart sounds.   Tachycardic 120s and 130s  Pulmonary/Chest:  Labored breathing. Oxygen saturations 80% on room air, improved to 94% on 2 L nasal cannula. Very diminished breath sounds throughout left lung fields.  Abdominal: Soft. There is no tenderness.  Musculoskeletal: He exhibits no tenderness.  Neurological: He is alert and oriented to person, place, and time.  Cranial Nerves II-XII grossly intact  Skin: Skin is warm and dry. No rash noted.  Psychiatric: He has a normal mood and affect.  Nursing note and vitals reviewed.   ED Course  Procedures (including critical care time) Labs Review Labs Reviewed  BASIC METABOLIC PANEL - Abnormal; Notable for the following:    Chloride 89 (*)    Glucose, Bld 131 (*)    Calcium 11.0 (*)    Anion gap 20 (*)    All other components within normal limits  CBG MONITORING, ED - Abnormal; Notable for the following:    Glucose-Capillary 138 (*)    All other components within normal limits  BRAIN NATRIURETIC PEPTIDE  I-STAT TROPOININ, ED    Imaging Review Dg Chest 2 View  08/24/2015  CLINICAL DATA:  Shortness of  breath.  Lung cancer. EXAM: CHEST - 2 VIEW COMPARISON:  One-view chest x-ray 08/14/2015. FINDINGS: The left hemi thorax is opacified. Mediastinal shift is noted to the right. This is similar to the pre thoracentesis film. Increasing interstitial edema is present on the right. A small right-sided pleural effusion is present. IMPRESSION: 1. Persistent opacification of the left hemi thorax. 2. Increasing mediastinal shift to the right. 3. Increasing right-sided edema and effusion. Electronically Signed   By: San Morelle M.D.   On: 08/24/2015 07:15   I have personally reviewed and evaluated these images and lab results as part of my medical decision-making.   EKG Interpretation None     Meds given in ED:  Medications  HYDROmorphone (DILAUDID) injection 1 mg (not administered)  LORazepam (ATIVAN) injection 2 mg (not administered)  LORazepam (ATIVAN) 2 MG/ML injection (not administered)  HYDROmorphone (DILAUDID) injection 1 mg (1 mg Intravenous Given 08/24/15 0651)    New Prescriptions   No medications on file    Filed Vitals:   08/24/15 0707 08/24/15 0730 08/24/15 0754 08/24/15 0800  BP: 109/81 124/98 114/66 113/78  Pulse: 129 128 127 125  Temp:      TempSrc:      Resp: '21 16 17 19  '$ SpO2: 90% 97% 85% 96%    MDM  TYLAN KINN is a 50 y.o. 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 They confirmed the patient is DO NOT RESUSCITATE, but has not been receiving adequate pain control at home with his oral morphine. On arrival, patient is tachycardic at 132, he arrives not wearing his previously prescribed oxygen and oxygen saturations are 78% on room air. Absent left side of breath sounds and chest x-ray confirms persistent left pleural effusion with associated mediastinal shift. Worsening right sided  edema with effusion. Discussed my attending, Dr. Rex Kras who agrees with plan for palliative care consult and likely medical admission. Family prefers  medical admission and palliative care versus home hospice care.  Consult with palliative care, Dr. Rowe Pavy will see in ED and recommended medical admission and they will consult. Discussed with [facility] hospitalist, Dr.Holwerda, patient admitted to medical service. Final diagnoses:  Hypoxemia        Comer Locket, PA-C 08/24/15 [place], MD 09/02/15 [phone]

## 2015-08-24 NOTE — ED Notes (Signed)
Brayton Baumgartner (Wife)  Contact Info:  Home: [phone] Cell :  [phone]

## 2015-08-24 NOTE — H&P (Signed)
[facility]  History and Physical Usha Slager L. 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 Wife unable to care for patient at home any longer . On arrival it was clarified w/ POA (wife) at bedside that this admission is for comfort only. I also spoke w/ Dr Rowe Pavy at bedside  who confirmed she will be handling the orders for PCA for pain control as well as scheduled benzo therapy for delirium and anxiety of the patient. The patient does not want telemetry , O2 , or any other treatment measures . He wants to be kept comfortable. He is aware that he is hypoxic but O2 declined.   In the past 30 days he has been admitted twice for thoracentesis and symptom control . Pathology of the pleural effusion on L confirmed carcinoma in the past month .   Chart Review:  Notes from hospital over the past month  Review of Systems:  Having delirium , pain , discomfort , dyspnea   Past Medical History  Diagnosis Date  . Hyperlipidemia     takes Lipitor nightly  . 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 Stented coronary artery 2000, 2010  . Heart attack ([facility]) 2000  . Shortness of breath     with exertion  . Peripheral edema     takes Furosemide daily  . Pneumonia     hx of >12yr ago  . Headache(784.0)   . Arthritis   . Joint pain   . Joint swelling   . Bruises easily     takes Effient daily  . GERD (gastroesophageal reflux disease)     takes Prilosec prn  . Urinary urgency   . Nocturia   . Diabetes mellitus     takes Januvia,Metformin,and Glipizide  daily  . ADD (attention deficit disorder with hyperactivity)   . Anxiety     takes Xanax bid  . Depression     takes Celexa daily  . ADD (attention deficit disorder with hyperactivity)     takes Adderall daily  . Sleep apnea     uses CPAP nightly  . Hypertension   . Coronary artery disease     last cath 1/14  . CHF (congestive heart failure) ([facility]   . Asthma   . Squamous cell carcinoma ([facility]     Past Surgical History  Procedure Laterality Date  . Knee surgery  24 yrs. ago    right  . Application of wound vac  11/20/2010    and suture of bleeding post-op wound right buttock  . Rectal surgery  11/20/2010    exc. of infection perianal area right buttock (I & D)  . Ankle arthroscopy  05/13/2011    Procedure: ANKLE ARTHROSCOPY;  Surgeon: DNinetta Lights MD;  [facility] [facility]  Service: Orthopedics;  Laterality: Right;  right ankle arthroscopy with extensive debridement synovectomy and chondroplasty  . Colonoscopy    . Total knee arthroplasty  10/13/2011    Procedure: TOTAL KNEE ARTHROPLASTY;  Surgeon: DNinetta Lights MD;  Location: [facility];  Service: Orthopedics;  Laterality: Right;  DR MURPHY WANTS 90 MINUTES FOR THIS CASE  . Cardiac catheterization  12/05/2008    and in 2000  . Coronary angioplasty      2 stents  . Cardiac catheterization  1/14    no stent put in  . Hydradenitis excision Right 10/30/2012    Procedure: wide EXCISION HYDRADENITIS right buttock ;  Surgeon: Harl Bowie, MD;  Location: [facility];  Service: General;  Laterality: Right;  . Cholecystectomy N/A 10/11/2013    Procedure: LAPAROSCOPIC CHOLECYSTECTOMY ;  Surgeon: Harl Bowie, MD;  Location: [place];  Service: General;  Laterality: N/A;  . Left heart catheterization with coronary angiogram N/A 06/15/2012    Procedure: LEFT HEART CATHETERIZATION WITH CORONARY ANGIOGRAM;  Surgeon: Clent Demark, MD;  Location: [facility] CATH LAB;  Service: Cardiovascular;  Laterality: N/A;  .  Video bronchoscopy with endobronchial ultrasound Left 08/05/2015    Procedure: VIDEO BRONCHOSCOPY WITH ENDOBRONCHIAL ULTRASOUND with Biopsy;  Surgeon: Collene Gobble, MD;  Location: [facility] [place];  Service: Thoracic;  Laterality: Left;    Social History:  reports that he has been smoking Cigarettes.  He has a 25 pack-year smoking history. He has never used smokeless tobacco. He reports that he does not drink alcohol or use illicit drugs.  No Known Allergies  Family History  Problem Relation Age of Onset  . Anesthesia problems Neg Hx   . Hypotension Neg Hx   . Malignant hyperthermia Neg Hx   . Pseudochol deficiency Neg Hx   . Other Mother     car accident; brain injury     Prior to Admission medications   Medication Sig Start Date End Date Taking? Authorizing Provider  albuterol (PROVENTIL HFA;VENTOLIN HFA) 108 (90 BASE) MCG/ACT inhaler Inhale 2 puffs into the lungs every 6 (six) hours as needed for wheezing.   Yes Historical Provider, MD  ALPRAZolam Duanne Moron) 1 MG tablet Take 0.5-1 mg by mouth 2 (two) times daily as needed for anxiety.    Yes Historical Provider, MD  citalopram (CELEXA) 40 MG tablet Take 40 mg by mouth every evening. PM   Yes Historical Provider, MD  morphine (MS CONTIN) 15 MG 12 hr tablet Take 30 mg by mouth every 12 (twelve) hours.  08/20/15  Yes Historical Provider, MD  morphine (ROXANOL) 20 MG/ML concentrated solution TAKE 0.5 -1.0 ml every two hours as needed for pain. 08/21/15  Yes Wyatt Portela, MD  trolamine salicylate (ASPERCREME) 10 % cream Apply 1 application topically 2 (two) times daily as needed for muscle pain.   Yes Historical Provider, MD  carvedilol (COREG) 3.125 MG tablet Take 1 tablet (3.125 mg total) by mouth 2 (two) times daily with a meal. Patient not taking: Reported on 08/24/2015 12/08/12   Charolette Forward, MD  LORazepam (ATIVAN) 1 MG tablet Take 1 tablet (1 mg total) by mouth every 4 (four) hours as needed for anxiety. Patient not taking: Reported on 08/24/2015  08/21/15   Wyatt Portela, MD  metFORMIN (GLUCOPHAGE) 1000 MG tablet Take 1,000 mg by mouth 2 (two) times daily. Reported on 08/24/2015    Historical Provider, MD  nitroGLYCERIN (NITROSTAT) 0.4 MG SL tablet Place 1 tablet (0.4 mg total) under the tongue every 5 (five) minutes x 3 doses as needed for chest pain. 12/08/12   Charolette Forward, MD  oxyCODONE-acetaminophen (PERCOCET) 10-325 MG tablet Take 1 tablet by mouth every 4 (four) hours as needed for pain. Patient not  taking: Reported on 08/24/2015 08/13/15   Wyatt Portela, MD   Physical Exam: Filed Vitals:   08/24/15 0707 08/24/15 0730 08/24/15 0754 08/24/15 0800  BP: 109/81 124/98 114/66 113/78  Pulse: 129 128 127 125  Temp:      TempSrc:      Resp: '21 16 17 19  '$ SpO2: 90% 97% 85% 96%    Physical exam was limited based on poor cooperation of patient . He was visibly agitated . He is not able to carry on meaningful conversation but responds to verbal and pain stimuli. Anicteric sclera , moist membranes. He has diminished breath sounds on the L , crackles on the R .  . RRR , no MRG . No edema of LE  His abdomen appeared full but not tender . Marland Kitchen No jaundice . Warm extremities    Wt Readings from Last 3 Encounters:  08/08/15 103.5 kg (228 lb 2.8 oz)  06/07/15 97.523 kg (215 lb)  10/30/13 110.406 kg (243 lb 6.4 oz)    Labs on Admission:  Basic Metabolic Panel:  Recent Labs Lab 08/24/15 0641  NA 137  K 4.1  CL 89*  CO2 28  GLUCOSE 131*  BUN 20  CREATININE 0.71  CALCIUM 11.0*   Liver Function Tests: No results for input(s): AST, ALT, ALKPHOS, BILITOT, PROT, ALBUMIN in the last 168 hours. No results for input(s): LIPASE, AMYLASE in the last 168 hours. No results for input(s): AMMONIA in the last 168 hours. CBC: No results for input(s): WBC, NEUTROABS, HGB, HCT, MCV, PLT in the last 168 hours. Cardiac Enzymes: No results for input(s): CKTOTAL, CKMB, CKMBINDEX, TROPONINI in the last 168 hours.  BNP (last 3 results)  Recent Labs   08/24/15 0641  BNP 81.9    ProBNP (last 3 results) No results for input(s): PROBNP in the last 8760 hours.  CBG:  Recent Labs Lab 08/24/15 0512  GLUCAP 138*    Radiological Exams on Admission: Dg Chest 2 View  08/24/2015  CLINICAL DATA:  Shortness of breath.  Lung cancer. EXAM: CHEST - 2 VIEW COMPARISON:  One-view chest x-ray 08/14/2015. FINDINGS: The left hemi thorax is opacified. Mediastinal shift is noted to the right. This is similar to the pre thoracentesis film. Increasing interstitial edema is present on the right. A small right-sided pleural effusion is present. IMPRESSION: 1. Persistent opacification of the left hemi thorax. 2. Increasing mediastinal shift to the right. 3. Increasing right-sided edema and effusion. Electronically Signed   By: San Morelle M.D.   On: 08/24/2015 07:15    EKG: Independently reviewed.   Active Problems:   Hypoxemia   Stage 4 lung cancer (HCC)   Assessment/Plan Stage 4 squamous cell carcinoma  -- palliative care on board today and entering orders for agitation and pain control . Comfort measures only . Declining all other therapy (O2, telemetry , etc ) . Admit to inpatient given IV meds required for pain control . If stabilizes over next 24-48 hours will plan to discharge to inpatient hospice at [facility].   Dr. Rowe Pavy (Palliative Care ) Contact info [phone]   Code Status: DNR , comfort measures only  Family Communication: wife , POA , at bedside  Disposition Plan/Anticipated LOS: to [facility] in 2-3 days   Time spent: 44 minutes  Velna Hatchet, MD  Internal Medicine Pager [phone] Cell [phone] If 7PM-7AM, please contact night-coverage at [email or web address], password [facility] 08/24/2015, 9:25 AM

## 2015-08-24 NOTE — Progress Notes (Signed)
Pt was resting; pt's sister, cousin, nephew and brother-in-law were bedside. Pt's sister said she guessed wife is at home. Sister spoke of how they were just in shock. She said she talked w/him yesterday and now this. Other family members were quiet and somber. Please page when wife arrives and/or when support is needed.  Chaplain Ernest Haber, M.Div.   08/24/15 1500  Clinical Encounter Type  Visited With Family

## 2015-08-24 NOTE — ED Notes (Signed)
Pt's spouse/POA here visiting and at bedside at this time.  Per POA, pt is a DNR and the copy is at their home, and that pt has desired to be treated "for comfort only".

## 2015-08-24 NOTE — ED Notes (Signed)
Brought in by EMS from home with c/o shortness of breath.  Per EMS, pt's family reported that pt has been having frequent shortness of breath and also this morning, pt was observed to be "lethargic".  Pt's O2 sat was 86% on EMS' arrival--- pt on continuous O2 but he keeps on taking it off according to family.  Pt has Stage 4 Lung Cancer, and is on hospice care.  Pt was given Duoneb treatment en rout to ED.

## 2015-08-24 NOTE — ED Notes (Signed)
PT does not want any oxygen to bring O2 up. O2 stat 86%

## 2015-08-25 ENCOUNTER — Encounter: Payer: Self-pay | Admitting: Radiation Oncology

## 2015-08-25 ENCOUNTER — Ambulatory Visit: Payer: Medicare HMO | Admitting: Radiation Oncology

## 2015-08-25 ENCOUNTER — Encounter: Payer: Self-pay | Admitting: *Deleted

## 2015-08-25 DIAGNOSIS — C3490 Malignant neoplasm of unspecified part of unspecified bronchus or lung: Secondary | ICD-10-CM

## 2015-08-25 MED ORDER — HYDROMORPHONE HCL PF 10 MG/ML IJ SOLN: 1.0000 mg/h | INTRAMUSCULAR | Status: AC

## 2015-08-25 MED ORDER — SCOPOLAMINE 1 MG/3DAYS TD PT72
1.0000 | MEDICATED_PATCH | TRANSDERMAL | Status: DC
  Administered 2015-08-25: 1.5 mg via TRANSDERMAL
  Filled 2015-08-25: qty 1

## 2015-08-25 MED ORDER — LORAZEPAM 2 MG/ML IJ SOLN: 1.0000 mg | INTRAMUSCULAR | Status: AC | PRN

## 2015-08-25 MED ORDER — HYDROMORPHONE BOLUS VIA INFUSION: 0.5000 mg | INTRAVENOUS | Status: AC | PRN

## 2015-08-25 MED ORDER — SCOPOLAMINE 1 MG/3DAYS TD PT72: 1.0000 | MEDICATED_PATCH | TRANSDERMAL | Status: AC

## 2015-08-25 NOTE — Discharge Summary (Signed)
Physician Discharge Summary  Cody [name]  KVQ:[id number]  DOB: 1965/07/20  DOA: 08/24/2015  PCP: Chesley Noon, MD  [facility] On[facility]: Dr. Zola Button  Admit date: 08/24/2015 Discharge date: 08/25/2015  Time spent: Less than 30 minutes  Recommendations for Outpatient Follow-up:  1. Patient being discharged to residential hospice at [facility] for end-of-life care.  Discharge Diagnoses:  Active Problems:   Hypoxemia   Stage 4 lung cancer [facility])   Discharge Condition: Improved & Stable  Diet recommendation: Regular diet  Filed Weights   08/24/15 1029  Weight: 91.7 kg (202 lb 2.6 oz)    History of present illness:  50 year old male with history of stage IV lung cancer with metastases to liver, bone, lymph nodes, adrenal gland, recurrent left pleural effusion, on home hospice, presented to ED on 08/24/15 with worsening dyspnea, pain and delirium. Wife unable to care for patient at home due to decline. Seen by admitting M.D. and palliative team on admission and transitioned to full comfort care.  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 Alen Blew, Primary Oncologist: Apparently was only able to go through a few cycles of radiation. He completely agrees with full comfort care and residential hospice. - Palliative care consultation and follow-up appreciated.  - As per Education officer, museum, patient has a bed available at [facility] today. - As per palliative care team, on IV Dilaudid infusion, when necessary IV Dilaudid for breakthrough pain or dyspnea, IV Ativan when necessary for agitation and scopolamine patch for her terminal secretions.  Other medical problems that will not be addressed in the hospital due to being complete comfort care HLD CAD  GERD DM HTN Anxiety and depression  DO NOT  RESUSCITATE   Consultants:  Palliative care medicine  Procedures:  Condom catheter   Discharge Exam:  Complaints: Unresponsive/nonverbal. As per RN, occasional agitation.  Filed Vitals:   08/24/15 1029 08/24/15 1455 08/24/15 2107 08/25/15 0616  BP: 88/69 131/97 124/85 120/88  Pulse: 110 126 110 119  Temp: 98.2 F (36.8 C) 97.5 F (36.4 C) 99 F (37.2 C) 98.7 F (37.1 C)  TempSrc:  Oral Oral Oral  Resp: '20 20 20 20  '$ Weight: 91.7 kg (202 lb 2.6 oz)     SpO2: 81% 97% 97% 94%    General exam: Pleasant middle-aged male lying comfortably propped up in bed. Does not appear in any distress. Did not attempt to arouse him. Respiratory system: Reduced breath sounds left >right. Wet sounding upper airway breath sounds. No increased work of breathing. Cardiovascular system: S1 & S2 heard, RRR. No JVD, murmurs, gallops, clicks or pedal edema. Gastrointestinal system: Abdomen is nondistended, soft and nontender. Normal bowel sounds heard. Condom catheter + Central nervous system: Drowsy and not arousable to call. No focal neurological deficits.  Discharge Instructions      Discharge Instructions    Call MD for:  difficulty breathing, headache or visual disturbances    Complete by:  As directed      Call MD for:  persistant nausea and vomiting    Complete by:  As directed      Call MD for:  severe uncontrolled pain    Complete by:  As directed      Call MD for:  temperature >100.4    Complete by:  As directed      Diet general    Complete by:  As directed      Increase activity slowly    Complete by:  As directed             Medication List    STOP taking these medications        albuterol 108 (90 Base) MCG/ACT inhaler  Commonly known as:  PROVENTIL HFA;VENTOLIN HFA     ALPRAZolam 1 MG tablet  Commonly known as:  XANAX     carvedilol 3.125 MG tablet  Commonly known as:  COREG     citalopram 40 MG tablet  Commonly known as:  CELEXA     LORazepam 1 MG tablet   Commonly known as:  ATIVAN  Replaced by:  LORazepam 2 MG/ML injection     metFORMIN 1000 MG tablet  Commonly known as:  GLUCOPHAGE     morphine 15 MG 12 hr tablet  Commonly known as:  MS CONTIN     morphine 20 MG/ML concentrated solution  Commonly known as:  ROXANOL     nitroGLYCERIN 0.4 MG SL tablet  Commonly known as:  NITROSTAT     oxyCODONE-acetaminophen 10-325 MG tablet  Commonly known as:  PERCOCET     trolamine salicylate 10 % cream  Commonly known as:  ASPERCREME      TAKE these medications        HYDROmorphone 2 mg/mL Soln  Commonly known as:  DILAUDID  Inject 0.5 mg into the vein every 30 (thirty) minutes as needed (or dyspnea). Pain or dyspnea     HYDROmorphone 25 mg in sodium chloride 0.9 % 47.5 mL  Inject 1 mg/hr into the vein continuous.     LORazepam 2 MG/ML injection  Commonly known as:  ATIVAN  Inject 0.5 mLs (1 mg total) into the vein every 30 (thirty) minutes as needed for anxiety.     scopolamine 1 MG/3DAYS  Commonly known as:  TRANSDERM-SCOP  Place 1 patch (1.5 mg total) onto the skin every 3 (three) days.         Get Medicines reviewed and adjusted: Please take all your medications with you for your next visit with your Primary MD  Please request your Primary MD to go over all hospital tests and procedure/radiological results at the follow up. Please ask your Primary MD to get all Hospital records sent to his/her office.  If you experience worsening of your admission symptoms, develop shortness of breath, life threatening emergency, suicidal or homicidal thoughts you must seek medical attention immediately by calling 911 or calling your MD immediately if symptoms less severe.  You must read complete instructions/literature along with all the possible adverse reactions/side effects for all the Medicines you take and that have been prescribed to you. Take any new Medicines after you have completely understood and accept all the possible adverse  reactions/side effects.   Do not drive when taking pain medications.   Do not take more than prescribed Pain, Sleep and Anxiety Medications  Special Instructions: If you have smoked or chewed Tobacco in the last 2 yrs please stop smoking, stop any regular Alcohol and or any Recreational drug use.  Wear Seat belts while driving.  Please note  You were cared for by a hospitalist during your hospital stay. Once you are discharged, your primary care physician will handle any further medical issues. Please note that NO REFILLS for any discharge medications will be authorized once you are discharged, as it is imperative that you return to your primary care physician (or establish a relationship  with a primary care physician if you do not have one) for your aftercare needs so that they can reassess your need for medications and monitor your lab values.    The results of significant diagnostics from this hospitalization (including imaging, microbiology, ancillary and laboratory) are listed below for reference.    Significant Diagnostic Studies: Dg Orthopantogram  08/03/2015  CLINICAL DATA:  Left-sided jaw pain, area of tooth 23. Edema in floor of mouth. EXAM: ORTHOPANTOGRAM/PANORAMIC COMPARISON:  Limited correlation made with head CT 07/31/2015. FINDINGS: Multiple teeth are absent. Many of the maxillary teeth are fragmented. Evaluation of the midline structures is limited by Panorex technique due to typical artifact in this area. There is possible lucency surrounding the left mandibular lateral incisor and cuspid. No evidence of acute fracture or dislocation. IMPRESSION: Probable periapical lucency surrounding the paramedian left mandibular teeth, suspicious for periodontal disease, suboptimally evaluated by Panorex technique due to artifact. Electronically Signed   By: Richardean Sale M.D.   On: 08/03/2015 19:07   Dg Chest 1 View  08/14/2015  CLINICAL DATA:  50 year old male status post left  thoracentesis. Known squamous cell carcinoma with recurrent left-sided pleural effusion. EXAM: CHEST 1 VIEW COMPARISON:  08/13/2015 FINDINGS: Continued opacification of the left hemi thorax is noted which likely represents a combination of some residual pleural fluid and left lung consolidation/atelectasis. A trace right pleural effusion is again noted. There is no evidence of pneumothorax. IMPRESSION: No evidence of pneumothorax following left thoracentesis. No significant change of left hemi thorax opacification likely representing a combination of some residual pleural fluid and left lung consolidations/atelectasis. Electronically Signed   By: Margarette Canada M.D.   On: 08/14/2015 17:26   Dg Chest 1 View  07/30/2015  CLINICAL DATA:  Post left thoracentesis EXAM: CHEST  1 VIEW COMPARISON:  Earlier film of the same day FINDINGS: No pneumothorax. Persistent opacification of the left hemi thorax. Can't exclude left pleural residual left pleural effusion. Narrowing of the distal left mainstem bronchus. Right lung clear. Heart size difficult to assess due to adjacent opacity. Visualized skeletal structures are unremarkable. IMPRESSION: 1. No pneumothorax post thoracentesis. 2. Persistent opacification of the left hemi thorax with left mainstem bronchus narrowing suggesting central obstructing lesion. Consider CT chest with contrast for further assessment. Electronically Signed   By: Lucrezia Europe M.D.   On: 07/30/2015 14:52   Dg Chest 2 View  08/24/2015  CLINICAL DATA:  Shortness of breath.  Lung cancer. EXAM: CHEST - 2 VIEW COMPARISON:  One-view chest x-ray 08/14/2015. FINDINGS: The left hemi thorax is opacified. Mediastinal shift is noted to the right. This is similar to the pre thoracentesis film. Increasing interstitial edema is present on the right. A small right-sided pleural effusion is present. IMPRESSION: 1. Persistent opacification of the left hemi thorax. 2. Increasing mediastinal shift to the right. 3.  Increasing right-sided edema and effusion. Electronically Signed   By: San Morelle M.D.   On: 08/24/2015 07:15   Dg Chest 2 View  08/13/2015  CLINICAL DATA:  50 year old male with recent diagnosis of lung cancer and left-sided pleural effusion. EXAM: CHEST  2 VIEW COMPARISON:  Chest radiograph dated 08/06/2015 and CT dated 07/31/2015 FINDINGS: There is complete opacification of the left hemithorax similar to the prior study. There is minimal atelectatic changes of the right main base. No focal consolidation on the right. There is no pleural effusion on the right. The left cardiac border is silhouetted of 83 large pleural effusion. No acute osseous pathology identified. IMPRESSION:  Complete opacification of the left hemithorax with no significant interval change since the most recent radiograph. Electronically Signed   By: Anner Crete M.D.   On: 08/13/2015 19:08   Dg Lumbar Spine Complete  08/20/2015  CLINICAL DATA:  [place] on his back this morning, mid lower back pain radiating down LEFT leg to calf, history diabetes mellitus, hypertension, coronary artery disease, CHF, asthma, smoking, squamous cell lung carcinoma of LEFT lower lobe EXAM: LUMBAR SPINE - COMPLETE 4+ VIEW COMPARISON:  CT abdomen and pelvis 07/31/2015 FINDINGS: 5 non-rib-bearing lumbar vertebra. Osseous demineralization. Facet degenerative changes lower lumbar spine. Disc space narrowing L4-L5, L1-L2, at lower thoracic spine. Mild chronic anterior height loss at T12 appears grossly stable. No definite acute fracture, subluxation or bone destruction. No spondylolysis. Tiny nonobstructing RIGHT renal calculus. Post cholecystectomy. SI joints symmetric. Scattered atherosclerotic calcifications. IMPRESSION: Mild degenerative changes of the lumbar spine as above. Anterior height loss T12, appears old. No acute abnormalities. Tiny nonobstructing RIGHT renal calculus. Electronically Signed   By: Lavonia Dana M.D.   On: 08/20/2015 09:31   Ct  Head W Wo Contrast  07/31/2015  CLINICAL DATA:  Follow-up metastatic cancer. EXAM: CT HEAD WITHOUT AND WITH CONTRAST TECHNIQUE: Contiguous axial images were obtained from the base of the skull through the vertex without and with intravenous contrast CONTRAST:  50 cc Omnipaque 300 COMPARISON:  MRI of the brain October 08, 2013 FINDINGS: RIGHT frontotemporal encephalomalacia. No intraparenchymal hemorrhage, mass effect, midline shift or acute large vascular territory infarcts. No abnormal insular parenchymal enhancement. No abnormal extra-axial fluid collections. 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 No abnormal extra-axial enhancement. Ocular globes and orbital contents are normal. Included paranasal sinuses and mastoid air cells are well aerated. No destructive bony lesions in the calvarium, no skull fracture. IMPRESSION: No CT findings of intracranial metastasis though, MRI with contrast is more sensitive. 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 Electronically Signed   By: Elon Alas M.D.   On: 07/31/2015 21:37   Ct Chest W Contrast  07/31/2015  CLINICAL DATA:  Pleural effusion.  Left lung mass. EXAM: CT CHEST WITH CONTRAST TECHNIQUE: Multidetector CT imaging of the chest was performed during intravenous contrast administration. CONTRAST:  62m OMNIPAQUE IOHEXOL 300 MG/ML  SOLN I was called to the evaluate the patient at 0506 hours on 07/31/2015 for complaint of contrast extravasation. Estimated volume of extravasation was about 25-30 mL. On examination, the patient was alert and oriented and in no distress. Patient was sitting in the wheelchair with ice pack over the right antecubital fossa. IV cannula was in place in the right antecubital fossa with small amount of bruising which, by history, was present prior to the extravasation. There was approximately 5-6 cm area of swelling in the antecubital fossa anteriorly. The patient states this area was sore  to palpation. No redness, blistering, or skin breakdown. Area of swelling was soft to palpation. Distal pulses and capillary refill were normal. Grip strength was normal. Patient was advised place ice packs over the area 3 times a day for the next 3 days or until resolution. Elevation of the extremity was recommended. Patient was instructed to seek medical attention if there is any change in sensation, increased pain, increased swelling, skin breakdown, blistering, or change in skin color. Contrast extravasation protocol orders were entered by the technologist. COMPARISON:  Chest radiograph 07/30/2015 FINDINGS: Normal heart size. Prominent Coronary artery calcifications. Cardiac stents are likely present. Normal caliber thoracic aorta. Lymphadenopathy in the  chest with subcarinal lymph nodes measuring about 2.5 cm short axis dimension. Right superior paratracheal nodes measuring about 11 mm diameter. Esophagus is decompressed. There is a moderate left pleural effusion with collapse and consolidation of much of the left lung. Minimal residual aeration is present. The left mainstem bronchus is narrowed with probable left perihilar mass lesion. Presumed mass is obscured by the surrounding postobstructive change and effusion. Mild atelectasis in the right lung base. No pneumothorax. Included portions of the upper abdominal organs demonstrate bilateral adrenal gland nodules, measuring 2.8 cm on the right and 4.2 cm on the left. These are likely metastatic. Surgical absence of the gallbladder. Scattered soft tissue nodules in the subcutaneous fat around the liver and in the omentum may represent metastatic lymph nodes or peritoneal deposits. Degenerative changes throughout the thoracic spine. No destructive bone lesions are appreciated. Nonobstructing stones in the right kidney. IMPRESSION: Moderate left pleural effusion with collapse and consolidation of the left lung. Narrowing of the left mainstem bronchus likely  indicating an obstructing mass lesion although the mass is obscured to visualization by the surrounding postobstructive changes and effusion. Mild atelectasis in the right lung base. Probable metastatic mediastinal lymph nodes, bilateral adrenal gland nodules, and abdominal peritoneal deposits. Note that the examination was complicated by contrast extravasation of about 25-30 cc into the right antecubital fossa. Electronically Signed   By: Lucienne Capers M.D.   On: 07/31/2015 05:55   Ct Abdomen Pelvis W Contrast  07/31/2015  CLINICAL DATA:  Metastatic cancer follow-up. EXAM: CT ABDOMEN AND PELVIS WITH CONTRAST TECHNIQUE: Multidetector CT imaging of the abdomen and pelvis was performed using the standard protocol following bolus administration of intravenous contrast. CONTRAST:  163m OMNIPAQUE IOHEXOL 300 MG/ML  SOLN COMPARISON:  11/30/2010 FINDINGS: Lower chest and abdominal wall: Known large left pleural effusion with mass and collapse in the left lower lobe and lingula. Necrotic subcarinal lymphadenopathy. Extensive coronary atherosclerotic calcification with a remote septal subendocardial infarct. Hepatobiliary: There are at least 6 low-density masses in the liver compatible with metastases. These measure 14 mm or smaller and are marked on the image for follow-up purposes.Cholecystectomy with negative common bile duct. Pancreas: Unremarkable. Spleen: Unremarkable. Adrenals/Urinary Tract: Centrally low dense bilateral adrenal masses, larger on the left- measuring up to 51 mm. Symmetric renal enhancement. No hydronephrosis Unremarkable bladder. Reproductive:No pathologic findings. Stomach/Bowel:  No obstruction Vascular/Lymphatic: There are numerous mesenteric or less likely peritoneal nodules, the largest along the right colon 4:49 measuring 21 x 28 mm. No ascites. Extensive atherosclerosis. Peritoneal: No ascites or pneumoperitoneum. Musculoskeletal: Focal marrow lesion in the subtrochanteric left medullary  cavity. No evidence of cortical involvement or fracture. 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 There is mediastinal adenopathy and malignant appearing left pleural effusion. Intra-abdominal metastases seen in the liver, adrenal glands, and mesentery. 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 Electronically Signed   By: JMonte FantasiaM.D.   On: 07/31/2015 21:54   Dg Chest Port 1 View  08/06/2015  CLINICAL DATA:  Pleural effusion. EXAM: PORTABLE CHEST 1 VIEW COMPARISON:  08/04/2015.  CT 07/31/2015 FINDINGS: Narrowing of the left mainstem bronchus again noted. Opacification of the left hemithorax is noted. Mild right base subsegmental atelectasis. Heart size difficult to assess due to opacification of the left hemi thorax. No pneumothorax. IMPRESSION: Persistent opacification of the left hemi thorax with narrowing of the left mainstem bronchus. Reference is made to prior CT report 07/31/2015. Electronically Signed   By: TMarcello Moores Register   On:  08/06/2015 07:42   Dg Chest Port 1 View  08/04/2015  CLINICAL DATA:  50 year old male with left-sided pleural effusion. EXAM: PORTABLE CHEST 1 VIEW COMPARISON:  Chest radiograph dated 07/30/2015 CT dated 07/31/2015 FINDINGS: There is complete opacification of left hemithorax. The right lung is clear. There is no pneumothorax. There is persistent high-grade narrowing of the left mainstem bronchus as seen on the prior study and concerning for underlying obstructive mass. There is no significant mediastinal shift. The osseous structures are grossly unremarkable. IMPRESSION: Persistent opacification of the left hemithorax with narrowing of the left mainstem bronchus. Electronically Signed   By: Anner Crete M.D.   On: 08/04/2015 07:39   Dg Chest Portable 1 View  07/30/2015  CLINICAL DATA:  Diminished lung sounds on the left. Shortness of breath and pneumonia. EXAM: PORTABLE CHEST 1 VIEW COMPARISON:   12/06/2012 FINDINGS: Right chest is normal. Left hemi thorax is completely opacified consistent with a combination of pulmonary infiltrate, collapse and pleural fluid. Mediastinum is not shifted. No bony abnormality seen. IMPRESSION: Complete opacification of the left hemi thorax without mediastinal shift. This is likely due to a combination of pneumonia, atelectasis and pleural fluid. Electronically Signed   By: Nelson Chimes M.D.   On: 07/30/2015 11:13   US Thoracentesis Asp Pleural Space W/img Guide  08/14/2015  INDICATION: Patient with known squamous cell carcinoma of the lung. Recurrent left-sided pleural effusion. Request for diagnostic and therapeutic thoracentesis has been made. EXAM: ULTRASOUND GUIDED DIAGNOSTIC AND THERAPEUTIC THORACENTESIS MEDICATIONS: 1% lidocaine COMPLICATIONS: None immediate. PROCEDURE: An ultrasound guided thoracentesis was thoroughly discussed with the patient and questions answered. The benefits, risks, alternatives and complications were also discussed. The patient understands and wishes to proceed with the procedure. Written consent was obtained. Ultrasound was performed to localize and Layla an adequate pocket of fluid in the left chest. The area was then prepped and draped in the normal sterile fashion. 1% Lidocaine was used for local anesthesia. Under ultrasound guidance a Safe-T-Centesis catheter was introduced. Thoracentesis was performed. The catheter was removed and a dressing applied. FINDINGS: A total of approximately 2 L of serosanguineous fluid was removed. Samples were sent to the laboratory as requested by the clinical team. IMPRESSION: Successful ultrasound guided diagnostic and therapeutic left thoracentesis yielding 2 L of pleural fluid. Read by: Saverio Danker, PA-C Electronically Signed   By: Aletta Edouard M.D.   On: 08/14/2015 16:48   US Thoracentesis Asp Pleural Space W/img Guide  07/30/2015  INDICATION: Shortness of breath. Recent pneumonia. Large left  pleural effusion. Request for diagnostic and therapeutic thoracentesis. EXAM: ULTRASOUND GUIDED LEFT THORACENTESIS MEDICATIONS: 1% Lidocaine. COMPLICATIONS: None immediate. PROCEDURE: An ultrasound guided thoracentesis was thoroughly discussed with the patient and questions answered. The benefits, risks, alternatives and complications were also discussed. The patient understands and wishes to proceed with the procedure. Written consent was obtained. Ultrasound was performed to localize and Daune an adequate pocket of fluid in the left chest. The area was then prepped and draped in the normal sterile fashion. 1% Lidocaine was used for local anesthesia. Under ultrasound guidance a 6 Fr Safe-T-Centesis catheter was introduced. Thoracentesis was performed. The catheter was removed and a dressing applied. FINDINGS: A total of approximately 2 liters of amber fluid was removed. Samples were sent to the laboratory as requested by the clinical team. IMPRESSION: Successful ultrasound guided left thoracentesis yielding 2 liters of pleural fluid. Read by:  Gareth Eagle, PA-C Electronically Signed   By: Marybelle Killings M.D.   On: 07/30/2015  14:55    Microbiology: No results found for this or any previous visit (from the past 240 hour(s)).   Labs: Basic Metabolic Panel:  Recent Labs Lab 08/24/15 0641  NA 137  K 4.1  CL 89*  CO2 28  GLUCOSE 131*  BUN 20  CREATININE 0.71  CALCIUM 11.0*   Liver Function Tests: No results for input(s): AST, ALT, ALKPHOS, BILITOT, PROT, ALBUMIN in the last 168 hours. No results for input(s): LIPASE, AMYLASE in the last 168 hours. No results for input(s): AMMONIA in the last 168 hours. CBC: No results for input(s): WBC, NEUTROABS, HGB, HCT, MCV, PLT in the last 168 hours. Cardiac Enzymes: No results for input(s): CKTOTAL, CKMB, CKMBINDEX, TROPONINI in the last 168 hours. BNP: BNP (last 3 results)  Recent Labs  08/24/15 0641  BNP 81.9    ProBNP (last 3 results) No  results for input(s): PROBNP in the last 8760 hours.  CBG:  Recent Labs Lab 08/24/15 0512  GLUCAP 138*       Signed:  Vernell Leep, MD, FACP, FHM. [facility] Pager [phone]  If 7PM-7AM, please contact night-coverage [email or web address] Password [facility] 08/25/2015, 12:59 PM

## 2015-08-25 NOTE — Progress Notes (Signed)
[facility] Liaison Follow-up  Plan is for patient to discharge to [facility] today.  ? Please fax discharge summary to [phone]. ? RN please call report to [phone]. ? Please arrange transport for as early in day as possible.  ? Thank you, Freddi Starr RN, [facility] Liaison [phone]

## 2015-08-25 NOTE — Progress Notes (Signed)
No note

## 2015-08-25 NOTE — Progress Notes (Signed)
Dilaudid drip discontinued for d/c to [facility]. Remainder of drip (47m)  wasted into sink with CGeorgetta Haberas witness.

## 2015-08-25 NOTE — Progress Notes (Signed)
[facility] RN visit  This is a GIP-related, covered admission from 08/24/15 with Hospice diagnosis of lung cancer with liver, bone, lymph and adrenal metastasis per Dr Lyman Speller.   Patient admitted to [facility] due to sx of increased pain, dyspnea and agitation.   He has a DNR order.   Patient seen at bedside.  Wife not present at this time. Patient resting, only minimally responsive. He has a Dilaudid PCA infusing at '1mg'$ /hr IV .  He has 02 in place and foley catheter.    The patient has received 2 doses of ativan '1mg'$  for agitation.   Respirations are noisy, congested. Patient does have a scopalamine patch 1.'5mg'$  placed today for congestion.  PO intake very poor.  Appears to be transitioning.   Goal is to transferr patient to [facility] SW is currently contacting patient's wife to discuss [employer] and to determine bed availability.     [facility] will continue to follow and anticipate/coordinate discharge. Please call with any hospice related questions/concerns.   Mickie Kay, [facility]  [phone]

## 2015-08-25 NOTE — Progress Notes (Signed)
CSW received notification from [place], Marilynne Halsted that pt has a bed available at [facility] for today.   CSW facilitated pt discharge needs including contacting facility, faxing pt discharge information to [facility], discussing with pt wife at bedside, providing RN phone number to call report, and arranging ambulance transport for pt to [facility].   CSW provided pt wife with directions to [facility]. Pt wife coping as well as to be anticipated with transfer.   No further social work needs identified at this time.  CSW signing off.   Alison Murray, MSW, [place] Work [phone]

## 2015-08-25 NOTE — Progress Notes (Signed)
[facility] MSW note: Patient is a current hospice patient. Pt is a DNR. Pt has declined since MSW visit on last Friday. Pt has a bed at [facility] today. MSW has conferenced with [facility] social worker today to discuss transfer to [facility]. MSW met with spouse-Donna and daughter-Lauren to finalize plans for [facility]. MSW educated spouse on end of life. MSW offered support and education.   Marilynne Halsted, MSW

## 2015-08-25 NOTE — Progress Notes (Signed)
Nutrition Brief Note  Patient identified as being at nutrition risk per MST.   Chart reviewed.  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  Patient now transitioning to comfort care.  No nutrition interventions warranted at this time.  Please consult as needed.  Veronda Prude, Dietetic Intern Pager: [phone]

## 2015-08-25 NOTE — Progress Notes (Signed)
Cody [name], Cody  DOA: 08/24/2015 PCP: Chesley Noon, Cody Outpatient Specialists:   Hospital course: 50 year old male [name] history of stage IV lung cancer [name] metastases to [name], Cody [name], Cody [name], Cody [name], Cody [name], Cody [name], Cody [name] worsening dyspnea, pain and delirium. Wife unable to care for patient at home due to decline. Seen by admitting M.D. and palliative team Cody admission and transition to full comfort care. Awaiting residential [name] bed for discharge.   Assessment & Plan:   Stage IV squamous cell lung cancer - Reported metastases to [name], Cody [name], Cody [name], Cody [name] and Cody [name] - Discussed [name] Dr. Alen Blew, Primary Oncologist: Apparently was only able to go through a few cycles of radiation. He completely agrees [name] full comfort care and residential [name]. - Palliative care consultation and follow-up appreciated. Discussed [name] clinical social worker and await residential [name] bed for discharge.  Other medical problems that will not be addressed in the hospital due to being complete comfort care HLD CAD  GERD DM HTN Anxiety and depression   DVT prophylaxis: None due to complete comfort care. Code Status: DO NOT RESUSCITATE Family Communication: None at bedside Disposition Plan: DC to residential [name] when bed available.   Consultants:  Palliative care medicine  Procedures:  Condom catheter  Antimicrobials:  None   Subjective: Unresponsive/nonverbal. As per RN, occasional agitation.  Objective: Filed Vitals:   08/24/15 1029 08/24/15 1455 08/24/15 2107 08/25/15 0616  BP: 88/69 131/97 124/85 120/88  Pulse: 110 126 110 119  Temp: 98.2 F (36.8 C) 97.5 F (36.4 C) 99 F (37.2 C) 98.7 F (37.1 C)  TempSrc:  Oral Oral Oral  Resp: '20 20 20 20  '$ Weight: 91.7 kg (202 lb 2.6 oz)     SpO2: 81% 97% 97%  94%    Intake/Output Summary (Last 24 hours) at 08/25/15 1112 Last data filed at 08/25/15 7482  Gross per 24 hour  Intake      0 ml  Output    500 ml  Net   -500 ml   Filed Weights   08/24/15 1029  Weight: 91.7 kg (202 lb 2.6 oz)    Exam:  General exam: Pleasant middle-aged male lying comfortably propped up in bed. Does not appear in any distress. Did not attempt to arouse him. Respiratory system: Reduced breath sounds left >right. Wet sounding upper airway breath sounds. No increased work of breathing. Cardiovascular system: S1 & S2 heard, RRR. No JVD, murmurs, gallops, clicks or pedal edema. Gastrointestinal system: Abdomen is nondistended, soft and nontender. Normal bowel sounds heard. Condom catheter + Central nervous system: Drowsy and not arousable to call. No focal neurological deficits.    Data Reviewed: Basic Metabolic Panel:  Recent Labs Lab 08/24/15 0641  NA 137  K 4.1  CL 89*  CO2 28  GLUCOSE 131*  BUN 20  CREATININE 0.71  CALCIUM 11.0*   [name] Function Tests: No results for input(s): AST, ALT, ALKPHOS, BILITOT, PROT, ALBUMIN in the last 168 hours. No results for input(s): LIPASE, AMYLASE in the last 168 hours. No results for input(s): AMMONIA in the last 168 hours. CBC: No results for input(s): WBC, NEUTROABS, HGB, HCT, MCV, PLT in the last 168 hours. Cardiac Enzymes: No results for input(s): CKTOTAL, CKMB, CKMBINDEX, TROPONINI in the last 168 hours. BNP (last 3 results) No results for input(s): PROBNP in the last 8760 hours. CBG:  Recent Labs Lab 08/24/15 0512  GLUCAP 138*    No results found for this or any previous visit (from the past 240 hour(s)).       Studies: Dg Chest 2 View  08/24/2015  CLINICAL DATA:  Shortness of breath.  Lung cancer. EXAM: CHEST - 2 VIEW COMPARISON:  One-view chest x-ray 08/14/2015. FINDINGS: The left hemi thorax is opacified. Mediastinal shift is noted to the right. This is similar to the pre thoracentesis  film. Increasing interstitial edema is present Cody the right. A small right-sided pleural [name] is present. IMPRESSION: 1. Persistent opacification of the left hemi thorax. 2. Increasing mediastinal shift to the right. 3. Increasing right-sided edema and [name]. Electronically Signed   By: San Morelle M.D.   Cody: 08/24/2015 07:15        Scheduled Meds: . scopolamine  1 patch Transdermal Q72H   Continuous Infusions: . sodium chloride 10 mL/hr at 08/25/15 0857  . HYDROmorphone 1 mg/hr (08/25/15 0323)    Active Problems:   Hypoxemia   Stage 4 lung cancer ([place])    Time spent: 20 minutes.    Vernell Leep, Cody, FACP, FHM. [facility] Pager [phone]  If 7PM-7AM, please contact night-coverage [email or web address] Password [email or web address] 08/25/2015, 11:12 AM    LOS: 1 day

## 2015-08-25 NOTE — Progress Notes (Signed)
Daily Progress Note   Patient Name: Cody [name]       Date: 08/25/2015 DOB: August 13, 1965  Age: 50 y.o. MRN#: [id number] Attending Physician: Modena Jansky, MD Primary Care Physician: Chesley Noon, MD Admit Date: 08/24/2015  Reason for Consultation/Follow-up: Terminal Care  Life limiting illness: lung cancer.   Subjective:  remains on Dilaudid drip, no family at bedside.   Interval Events:  confused and agitated at times, discussed with RN: patient tries to take his O2 off, tries to take his condom cath off several times intermittently To titrate Dilaudid drip for comfort measures [facility] to evaluate for transfer to [facility] Wife not currently at bedside, wife with significant caregiver distress and burnout due to the patient's worsening decline Add scopolamine patch to help with secretions, also has Robinul IV PRN, discussed with RN about secretions management at end of life Prognosis days?  Length of Stay: 1 day  Current Medications: Scheduled Meds:  . scopolamine  1 patch Transdermal Q72H    Continuous Infusions: . sodium chloride 10 mL/hr at 08/25/15 0857  . HYDROmorphone 1 mg/hr (08/25/15 0323)    PRN Meds: HYDROmorphone, LORazepam, ondansetron **OR** ondansetron (ZOFRAN) IV  Physical Exam: Physical Exam             Not awake not alert Patient with resp congestion, gurgling breath sounds Coarse breath sounds anteriorly S1 S2 Abdomen soft extremities warm to touch no coolness no mottling noted Vital Signs: BP 120/88 mmHg  Pulse 119  Temp(Src) 98.7 F (37.1 C) (Oral)  Resp 20  Wt 91.7 kg (202 lb 2.6 oz)  SpO2 94% SpO2: SpO2: 94 % O2 Device: O2 Device: Nasal Cannula O2 Flow Rate: O2 Flow Rate (L/min): 2 L/min  Intake/output summary:  Intake/Output  Summary (Last 24 hours) at 08/25/15 0948 Last data filed at 08/25/15 8979  Gross per 24 hour  Intake      0 ml  Output    500 ml  Net   -500 ml   LBM: Last BM Date:  (pta) Baseline Weight: Weight: 91.7 kg (202 lb 2.6 oz) Most recent weight: Weight: 91.7 kg (202 lb 2.6 oz)       Palliative Assessment/Data: Flowsheet Rows        Most Recent Value   Intake Tab    Referral Department  Hospitalist   Unit at Time of Referral  ER   Palliative Care Primary Diagnosis  Cancer   Palliative Care Type  New Palliative care   Reason for referral  Pain, Non-pain Symptom, Clarify Goals of Care, End of Life Care Assistance   Date first seen by Palliative Care  08/24/15   Clinical Assessment    Palliative Performance Scale Score  20%   Pain Max last 24 hours  7   Pain Min Last 24 hours  6   Dyspnea Max Last 24 Hours  7   Dyspnea Min Last 24 hours  6   Psychosocial & Spiritual Assessment    Palliative Care Outcomes    Patient/Family meeting held?  Yes   Who was at the meeting?  patient, wife, friend.    Palliative Care follow-up planned  No      Additional Data Reviewed: CBC    Component Value Date/Time   WBC 14.8* 08/15/2015 0606   RBC 4.53 08/15/2015 0606   HGB 13.6 08/15/2015 0606   HCT 42.4 08/15/2015 0606   PLT 348 08/15/2015 0606   MCV 93.6 08/15/2015 0606   MCH 30.0 08/15/2015 0606   MCHC 32.1 08/15/2015 0606   RDW 15.4 08/15/2015 0606   LYMPHSABS 1.8 08/13/2015 1812   MONOABS 1.3* 08/13/2015 1812   EOSABS 0.2 08/13/2015 1812   BASOSABS 0.0 08/13/2015 1812    CMP     Component Value Date/Time   NA 137 08/24/2015 0641   K 4.1 08/24/2015 0641   CL 89* 08/24/2015 0641   CO2 28 08/24/2015 0641   GLUCOSE 131* 08/24/2015 0641   BUN 20 08/24/2015 0641   CREATININE 0.71 08/24/2015 0641   CALCIUM 11.0* 08/24/2015 0641   PROT 6.6 08/13/2015 1812   ALBUMIN 2.7* 08/13/2015 1812   AST 56* 08/13/2015 1812   ALT 40 08/13/2015 1812   ALKPHOS 131* 08/13/2015 1812   BILITOT  1.0 08/13/2015 1812   GFRNONAA >60 08/24/2015 0641   GFRAA >60 08/24/2015 0641       Problem List:  Patient Active Problem List   Diagnosis Date Noted  . Stage 4 lung cancer ([place]) 08/24/2015  . Dyspnea   . Agitation   . Encounter for palliative care   . Goals of care, counseling/discussion   . Hypoxemia 08/13/2015  . Primary cancer of left lower lobe of lung (HCC) - [facility]   . HLD (hyperlipidemia)   . Chronic diastolic CHF (congestive heart failure) ([place])   . OSA on CPAP   . Mass of lower lobe of left lung   . Postobstructive pneumonia   . Controlled diabetes mellitus type 2 with complications ([place])   . Recurrent left pleural effusion   . Diabetes mellitus due to underlying condition with hyperosmolarity without coma, without long-term current use of insulin ([place])   . Lung mass 07/30/2015  . Pleural effusion, left 07/30/2015  . Mass of lung   . Acute respiratory failure with hypoxia ([place])   . Essential hypertension   . Symptomatic cholelithiasis 10/11/2013  . Hypertension 10/03/2013  . Diabetes ([place]) 10/03/2013  . Coronary artery disease 10/03/2013  . Anxiety 10/03/2013  . Depression 10/03/2013  . Traumatic brain injury ([place]) 10/03/2013  . DOE (dyspnea on exertion) 04/02/2013  . Hidradenitis 04/17/2012  . OSA (obstructive sleep apnea) 08/11/2011     Palliative Care Assessment & Plan    1.Code Status:  DNR    Code Status Orders        Start  Ordered   08/24/15 1027  Do not attempt resuscitation (DNR)   Continuous    Question Answer Comment  In the event of cardiac or respiratory ARREST Do not call a "code blue"   In the event of cardiac or respiratory ARREST Do not perform Intubation, CPR, defibrillation or ACLS   In the event of cardiac or respiratory ARREST Use medication by any route, position, wound care, and other measures to relive pain and suffering. May use oxygen, suction and manual treatment of airway obstruction as needed for comfort.       08/24/15 1027    Code Status History    Date Active Date Inactive Code Status Order ID Comments User Context   08/24/2015  9:21 AM 08/24/2015 10:27 AM DNR [id number]  Loistine Chance, MD ED   08/13/2015 10:14 PM 08/15/2015  2:47 PM Full Code [id number]  Merton Border, MD Inpatient   08/07/2015  2:45 PM 08/08/2015  7:18 PM DNR [id number]  Raylene Miyamoto, MD Inpatient   07/30/2015  1:17 PM 08/07/2015  2:45 PM Full Code [id number]  Rondel Jumbo, PA-C ED   10/11/2013 10:56 AM 10/11/2013  7:00 PM Full Code [id number]  Coralie Keens, MD Inpatient   09/18/2013 11:21 AM 09/19/2013  4:29 AM Full Code [id number]  Dereck Ligas, MD HOV       2. Goals of Care/Additional Recommendations:     Limitations on Scope of Treatment: Full Comfort Care  Desire for further Chaplaincy support:no  Psycho-social Needs: Caregiving  Support/Resources  3. Symptom Management:      1. D/C IVF to [facility] due to resp congestion, continue Dilaudid drip.   4. Palliative Prophylaxis:   Delirium Protocol  5. Prognosis: Hours - Days  6. Discharge Planning:  Hospice facility   Care plan was discussed with  Patient's RN  Thank you for allowing the Palliative Medicine Team to assist in the care of this patient.   Time In: 8 Time Out: 825 Total Time 25 Prolonged Time Billed  no        [phone]  Loistine Chance, MD  08/25/2015, 9:48 AM  Please contact Palliative Medicine Team phone at [phone] for questions and concerns.

## 2015-08-25 NOTE — Progress Notes (Signed)
CSW received consult for residential hospice.   Pt admitted from [facility] with [facility] ([facility]).   CSW spoke with [facility] CSW, Marilynne Halsted and notified of recommendation for [employer] [facility]. [facility] will contact [employer] and notify CSW regarding availability. [facility] CSW states that she has also left messages with pt wife and awaiting call back.  CSW to await return phone call from Agency, Marilynne Halsted.   CSW to continue to follow.   Alison Murray, MSW, [place] Work [phone]

## 2015-08-26 ENCOUNTER — Encounter: Payer: Self-pay | Admitting: *Deleted

## 2015-08-29 ENCOUNTER — Ambulatory Visit: Payer: Medicare Other | Admitting: Radiation Oncology

## 2015-09-01 ENCOUNTER — Ambulatory Visit: Payer: Medicare HMO

## 2015-09-01 ENCOUNTER — Ambulatory Visit: Payer: Medicare HMO | Admitting: Radiation Oncology

## 2015-09-02 ENCOUNTER — Ambulatory Visit: Payer: Medicare HMO

## 2015-09-03 ENCOUNTER — Ambulatory Visit: Payer: Medicare HMO

## 2015-09-04 ENCOUNTER — Ambulatory Visit: Payer: Medicare HMO

## 2015-09-05 ENCOUNTER — Ambulatory Visit: Payer: Medicare HMO

## 2015-09-08 ENCOUNTER — Ambulatory Visit: Payer: Medicare HMO

## 2015-09-09 ENCOUNTER — Ambulatory Visit: Payer: Medicare HMO

## 2015-09-10 ENCOUNTER — Ambulatory Visit: Payer: Medicare HMO

## 2015-09-11 ENCOUNTER — Ambulatory Visit: Payer: Medicare HMO

## 2015-09-12 ENCOUNTER — Ambulatory Visit: Admission: RE | Admit: 2015-09-12 | Payer: Medicare HMO | Source: Ambulatory Visit

## 2015-09-13 NOTE — Progress Notes (Signed)
rec'd call from nancy at [facility]. Patient expired at 1333 today. All appts cancelled in epic. Note to dr Hazeline Junker desk.

## 2015-09-13 DEATH — deceased

## 2015-09-18 ENCOUNTER — Ambulatory Visit: Payer: Medicare Other | Admitting: Oncology

## 2015-12-25 NOTE — Progress Notes (Incomplete)
°  Radiation Oncology         [phone] ________________________________  Name: Cody [name] MRN: [id number]  Date: 08/25/2015  DOB: 05-17-1966  End of Treatment Note  Diagnosis:      ICD-9-CM ICD-10-CM   1. Primary cancer of left lower lobe of lung (HCC) 162.5 C34.32              Indication for treatment:  Palliative       Radiation treatment dates:   None.  Site/dose:  Planned to treat the Left lung to 30 Gy in 10 fractions at 3 Gy per fraction.  Beams/energy:   Conformal // 15X, 6X  Narrative: The patient did not undergo treatment.  Plan: The patient decided not to proceed with treatment and went into hospice care due to declining health.  ------------------------------------------------  Jodelle Gross, MD, PhD  This document serves as a record of services personally performed by Kyung Rudd, MD. It was created on his behalf by Arlyce Harman, a trained medical scribe. The creation of this record is based on the scribe's personal observations and the provider's statements to them. This document has been checked and approved by the attending provider.

## 2017-02-19 IMAGING — CT CT CHEST W/ CM
2 of 4 series · 11 of 36 positions shown, 13 images · IV contrast (Iodine)
Comparison: Chest radiograph 07/30/2015

CLINICAL DATA: Pleural effusion.  Left lung mass.

EXAM:
CT CHEST WITH CONTRAST
TECHNIQUE: Multidetector CT imaging of the chest was performed during
intravenous contrast administration.

[Series 201: chest with, idose (2) · axial · 0.68mm/px · z∈[-308,-28]mm · 8 of 66 slices shown, 10 images]
[im 5/66  mediastinal]
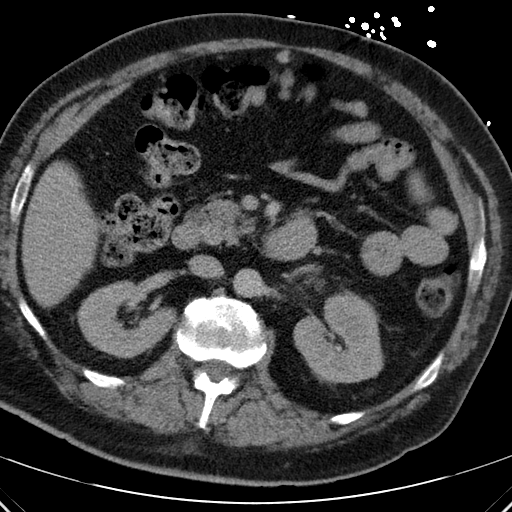
[im 5/66  lung]
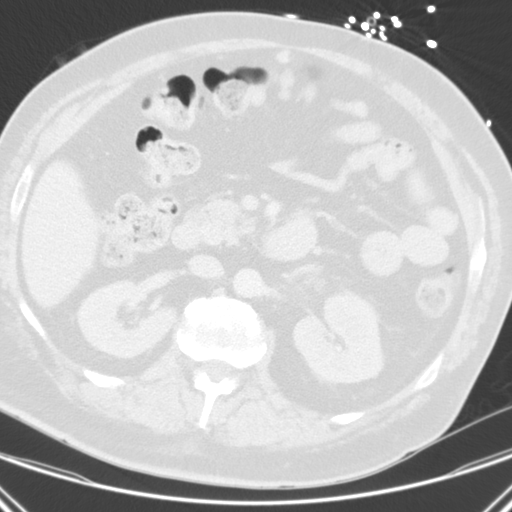
[im 14/66  lung]
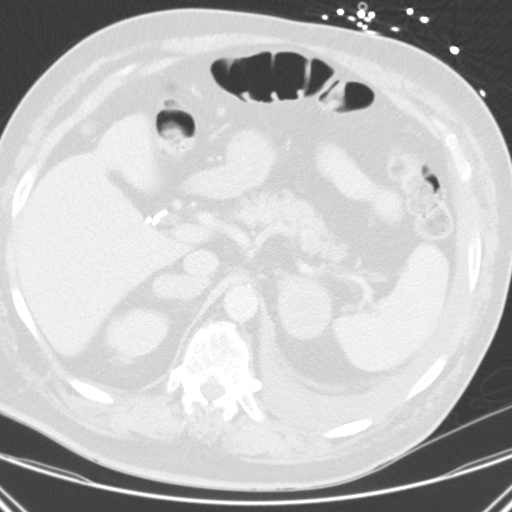
[im 22/66  lung]
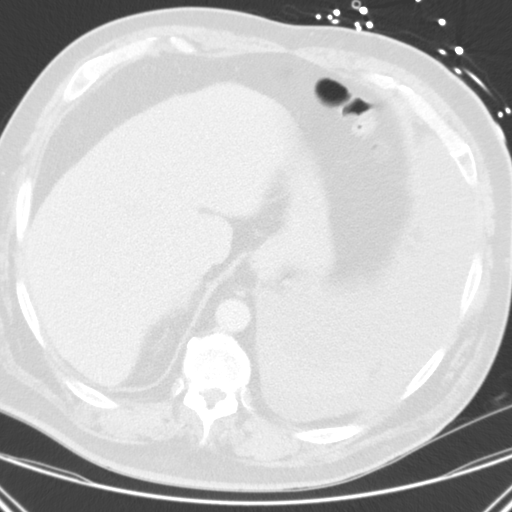
[im 31/66  lung]
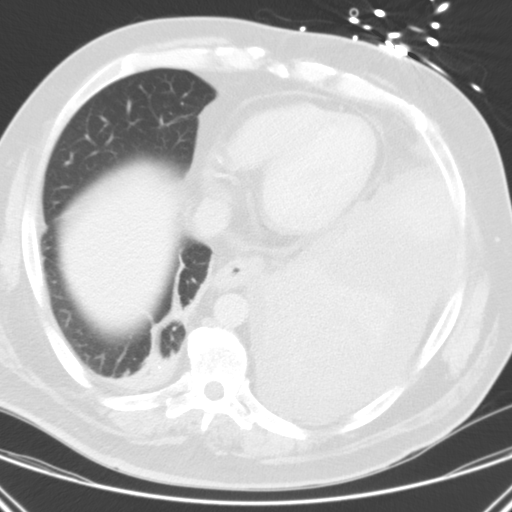
[im 35/66  mediastinal]
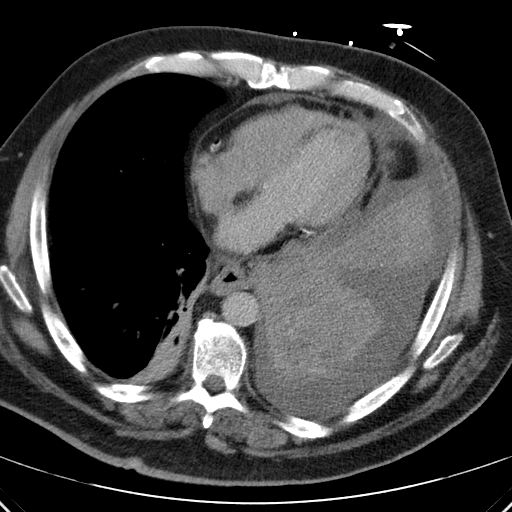
[im 35/66  lung]
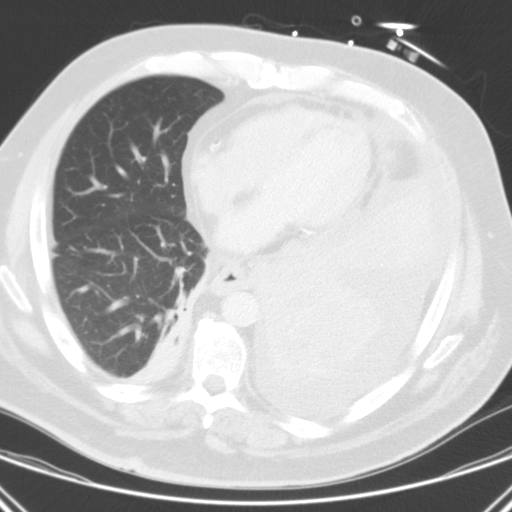
[im 44/66  lung]
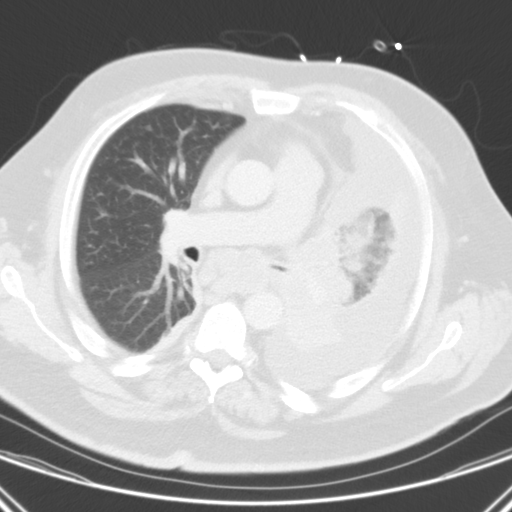
[im 53/66  lung]
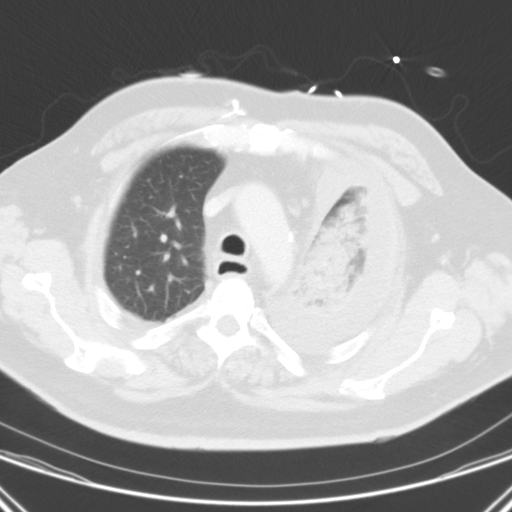
[im 61/66  lung]
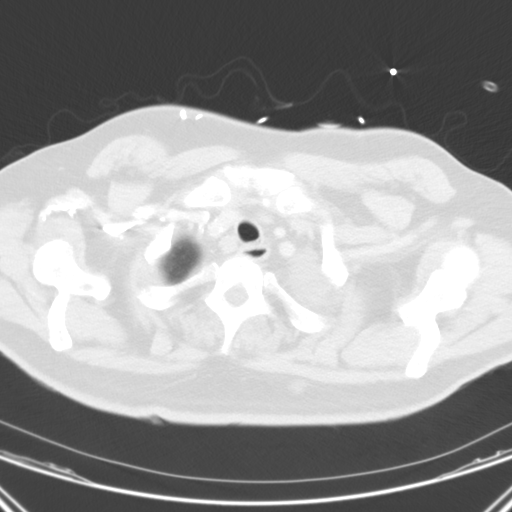

[Series 203: coronal, idose (2) · coronal · 0.45mm/px · 3 of 138 slices shown]
[im 28/138  lung]
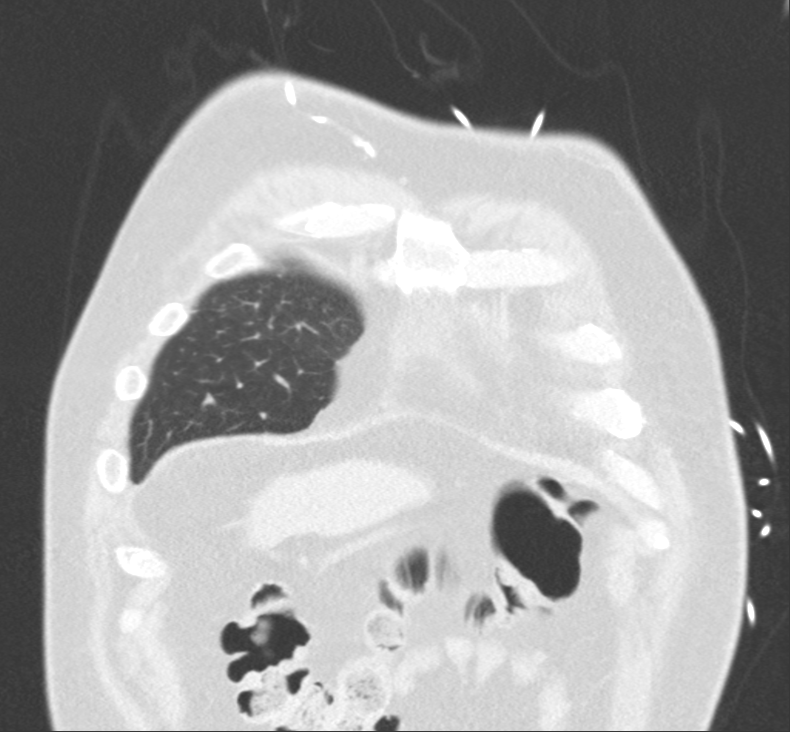
[im 55/138  lung]
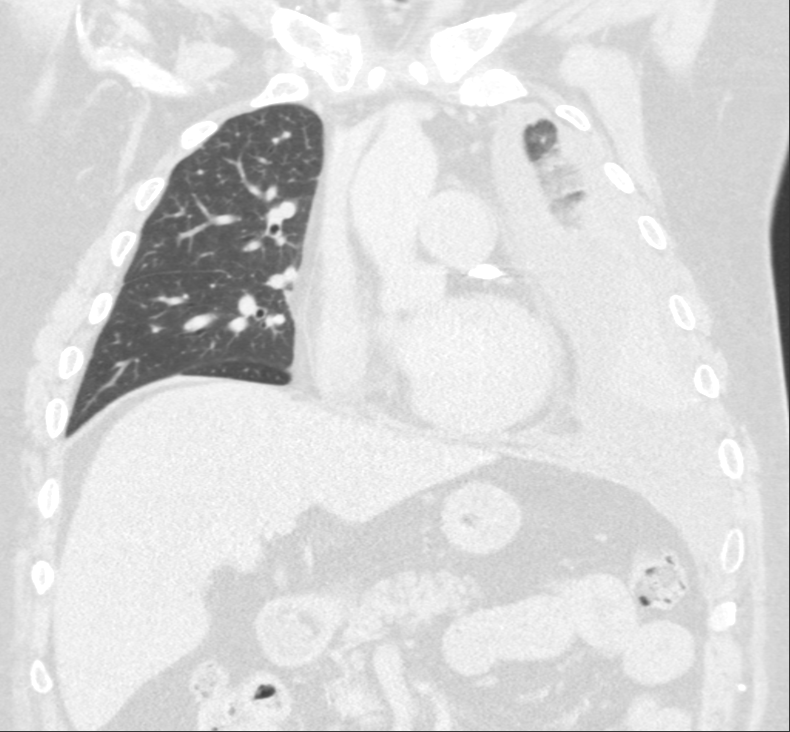
[im 83/138  lung]
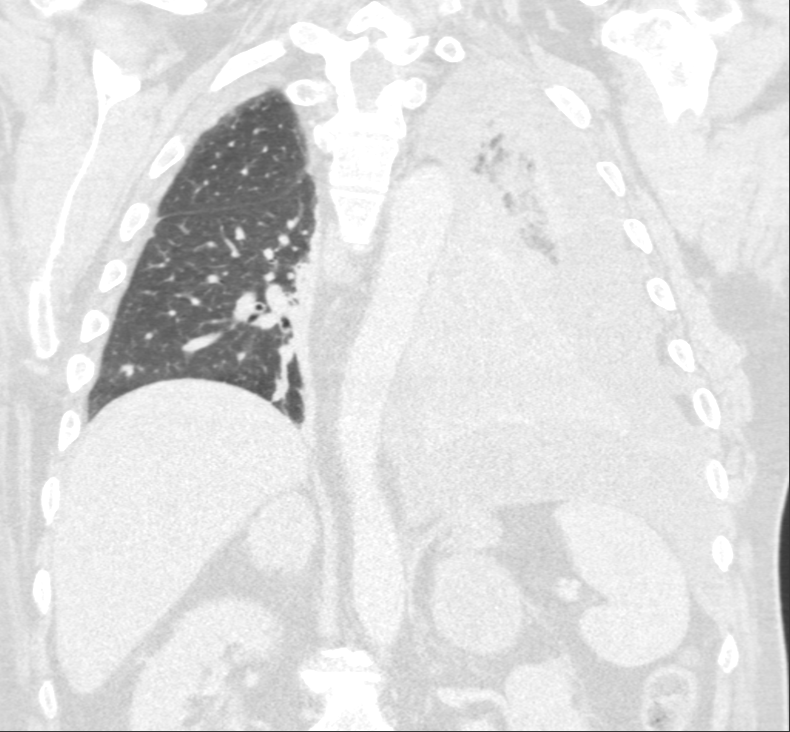

[11 of 36 positions shown; findings below may reference images not displayed]

CONTRAST:  75mL OMNIPAQUE IOHEXOL 300 MG/ML  SOLN

I was called to the evaluate the patient at 6166 hours on 07/31/2015
for complaint of contrast extravasation. Estimated volume of
extravasation was about 25-30 mL. On examination, the patient was
alert and oriented and in no distress. Patient was sitting in the
wheelchair with ice pack over the right antecubital fossa. IV
cannula was in place in the right antecubital fossa with small
amount of bruising which, by history, was present prior to the
extravasation. There was approximately 5-6 cm area of swelling in
the antecubital fossa anteriorly. The patient states this area was
sore to palpation. No redness, blistering, or skin breakdown. Area
of swelling was soft to palpation. Distal pulses and capillary
refill were normal. Grip strength was normal. Patient was advised
place ice packs over the area 3 times a day for the next 3 days or
until resolution. Elevation of the extremity was recommended.
Patient was instructed to seek medical attention if there is any
change in sensation, increased pain, increased swelling, skin
breakdown, blistering, or change in skin color. Contrast
extravasation protocol orders were entered by the technologist.
FINDINGS: Normal heart size. Prominent Coronary artery calcifications. Cardiac
stents are likely present. Normal caliber thoracic aorta.
Lymphadenopathy in the chest with subcarinal lymph nodes measuring
about 2.5 cm short axis dimension. Right superior paratracheal nodes
measuring about 11 mm diameter. Esophagus is decompressed.

There is a moderate left pleural effusion with collapse and
consolidation of much of the left lung. Minimal residual aeration is
present. The left mainstem bronchus is narrowed with probable left
perihilar mass lesion. Presumed mass is obscured by the surrounding
postobstructive change and effusion. Mild atelectasis in the right
lung base. No pneumothorax.

Included portions of the upper abdominal organs demonstrate
bilateral adrenal gland nodules, measuring 2.8 cm on the right and
4.2 cm on the left. These are likely metastatic. Surgical absence of
the gallbladder. Scattered soft tissue nodules in the subcutaneous
fat around the liver and in the omentum may represent metastatic
lymph nodes or peritoneal deposits. Degenerative changes throughout
the thoracic spine. No destructive bone lesions are appreciated.
Nonobstructing stones in the right kidney.
IMPRESSION: Moderate left pleural effusion with collapse and consolidation of
the left lung. Narrowing of the left mainstem bronchus likely
indicating an obstructing mass lesion although the mass is obscured
to visualization by the surrounding postobstructive changes and
effusion. Mild atelectasis in the right lung base. Probable
metastatic mediastinal lymph nodes, bilateral adrenal gland nodules,
and abdominal peritoneal deposits.

Note that the examination was complicated by contrast extravasation
of about 25-30 cc into the right antecubital fossa.
